# Patient Record
Sex: Male | Born: 1975
Health system: Southern US, Community
[De-identification: ages and names within clinical notes are randomized; demographics above are authoritative.]

## PROBLEM LIST (undated history)

## (undated) DIAGNOSIS — M1990 Unspecified osteoarthritis, unspecified site: Secondary | ICD-10-CM

## (undated) DIAGNOSIS — F419 Anxiety disorder, unspecified: Secondary | ICD-10-CM

## (undated) DIAGNOSIS — K219 Gastro-esophageal reflux disease without esophagitis: Secondary | ICD-10-CM

## (undated) DIAGNOSIS — M5126 Other intervertebral disc displacement, lumbar region: Secondary | ICD-10-CM

## (undated) DIAGNOSIS — R51 Headache: Secondary | ICD-10-CM

## (undated) HISTORY — PX: HERNIA REPAIR: SHX51

## (undated) HISTORY — PX: TONSILLECTOMY: SUR1361

## (undated) HISTORY — DX: Gastro-esophageal reflux disease without esophagitis: K21.9

## (undated) HISTORY — PX: LUMBAR EPIDURAL INJECTION: SHX1980

---

## 1999-03-20 ENCOUNTER — Emergency Department (HOSPITAL_COMMUNITY): Admission: EM | Admit: 1999-03-20 | Discharge: 1999-03-20 | Payer: Self-pay

## 1999-03-20 ENCOUNTER — Encounter: Payer: Self-pay | Admitting: Emergency Medicine

## 2006-04-02 ENCOUNTER — Emergency Department (HOSPITAL_COMMUNITY): Admission: EM | Admit: 2006-04-02 | Discharge: 2006-04-02 | Payer: Self-pay | Admitting: Emergency Medicine

## 2007-09-24 ENCOUNTER — Emergency Department (HOSPITAL_COMMUNITY): Admission: EM | Admit: 2007-09-24 | Discharge: 2007-09-24 | Payer: Self-pay | Admitting: Family Medicine

## 2010-08-16 ENCOUNTER — Other Ambulatory Visit: Payer: Self-pay | Admitting: Family Medicine

## 2010-08-16 DIAGNOSIS — M79606 Pain in leg, unspecified: Secondary | ICD-10-CM

## 2010-08-16 DIAGNOSIS — M545 Low back pain: Secondary | ICD-10-CM

## 2010-08-16 DIAGNOSIS — Z77018 Contact with and (suspected) exposure to other hazardous metals: Secondary | ICD-10-CM

## 2010-08-26 ENCOUNTER — Ambulatory Visit
Admission: RE | Admit: 2010-08-26 | Discharge: 2010-08-26 | Disposition: A | Discharge status: Home / Self Care | Payer: 59 | Source: Ambulatory Visit | Attending: Family Medicine | Admitting: Family Medicine

## 2010-08-26 DIAGNOSIS — M545 Low back pain: Secondary | ICD-10-CM

## 2010-08-26 DIAGNOSIS — Z77018 Contact with and (suspected) exposure to other hazardous metals: Secondary | ICD-10-CM

## 2010-08-26 DIAGNOSIS — M79606 Pain in leg, unspecified: Secondary | ICD-10-CM

## 2011-08-06 ENCOUNTER — Ambulatory Visit: Payer: 59

## 2011-09-12 ENCOUNTER — Encounter (HOSPITAL_COMMUNITY): Payer: Self-pay | Admitting: Emergency Medicine

## 2011-09-12 ENCOUNTER — Emergency Department (HOSPITAL_COMMUNITY)
Admission: EM | Admit: 2011-09-12 | Discharge: 2011-09-12 | Disposition: A | Discharge status: Home / Self Care | Payer: 59 | Attending: Emergency Medicine | Admitting: Emergency Medicine

## 2011-09-12 DIAGNOSIS — D72829 Elevated white blood cell count, unspecified: Secondary | ICD-10-CM | POA: Insufficient documentation

## 2011-09-12 DIAGNOSIS — K5289 Other specified noninfective gastroenteritis and colitis: Secondary | ICD-10-CM | POA: Insufficient documentation

## 2011-09-12 DIAGNOSIS — M549 Dorsalgia, unspecified: Secondary | ICD-10-CM | POA: Insufficient documentation

## 2011-09-12 DIAGNOSIS — K529 Noninfective gastroenteritis and colitis, unspecified: Secondary | ICD-10-CM

## 2011-09-12 HISTORY — DX: Anxiety disorder, unspecified: F41.9

## 2011-09-12 LAB — COMPREHENSIVE METABOLIC PANEL
AST: 22 U/L (ref 0–37)
Albumin: 4.1 g/dL (ref 3.5–5.2)
Alkaline Phosphatase: 58 U/L (ref 39–117)
Chloride: 104 mEq/L (ref 96–112)
Potassium: 3.8 mEq/L (ref 3.5–5.1)
Total Bilirubin: 0.6 mg/dL (ref 0.3–1.2)
Total Protein: 7.5 g/dL (ref 6.0–8.3)

## 2011-09-12 LAB — URINALYSIS, ROUTINE W REFLEX MICROSCOPIC
Bilirubin Urine: NEGATIVE
Glucose, UA: NEGATIVE mg/dL
Ketones, ur: NEGATIVE mg/dL
Leukocytes, UA: NEGATIVE
Specific Gravity, Urine: 1.017 (ref 1.005–1.030)
pH: 8.5 — ABNORMAL HIGH (ref 5.0–8.0)

## 2011-09-12 LAB — DIFFERENTIAL
Basophils Absolute: 0 10*3/uL (ref 0.0–0.1)
Basophils Relative: 0 % (ref 0–1)
Eosinophils Absolute: 0.2 10*3/uL (ref 0.0–0.7)
Neutro Abs: 19.3 10*3/uL — ABNORMAL HIGH (ref 1.7–7.7)
Neutrophils Relative %: 88 % — ABNORMAL HIGH (ref 43–77)

## 2011-09-12 LAB — URINE MICROSCOPIC-ADD ON

## 2011-09-12 LAB — CBC
MCHC: 35.5 g/dL (ref 30.0–36.0)
Platelets: 276 10*3/uL (ref 150–400)
RDW: 12.8 % (ref 11.5–15.5)

## 2011-09-12 MED ORDER — ONDANSETRON HCL 4 MG/2ML IJ SOLN
4.0000 mg | Freq: Once | INTRAMUSCULAR | Status: AC
  Administered 2011-09-12: 4 mg via INTRAVENOUS
  Filled 2011-09-12: qty 2

## 2011-09-12 MED ORDER — DICYCLOMINE HCL 20 MG PO TABS: 20.0000 mg | ORAL_TABLET | Freq: Four times a day (QID) | ORAL | Status: DC | PRN

## 2011-09-12 MED ORDER — ONDANSETRON HCL 4 MG PO TABS: 4.0000 mg | ORAL_TABLET | Freq: Four times a day (QID) | ORAL | Status: AC | PRN

## 2011-09-12 MED ORDER — FENTANYL CITRATE 0.05 MG/ML IJ SOLN
50.0000 ug | Freq: Once | INTRAMUSCULAR | Status: DC
  Filled 2011-09-12: qty 2

## 2011-09-12 MED ORDER — FENTANYL CITRATE 0.05 MG/ML IJ SOLN
50.0000 ug | Freq: Once | INTRAMUSCULAR | Status: AC
  Administered 2011-09-12: 50 ug via INTRAVENOUS
  Filled 2011-09-12: qty 2

## 2011-09-12 MED ORDER — DICYCLOMINE HCL 10 MG PO CAPS
20.0000 mg | ORAL_CAPSULE | Freq: Once | ORAL | Status: AC
  Administered 2011-09-12: 20 mg via ORAL
  Filled 2011-09-12 (×2): qty 2

## 2011-09-12 NOTE — ED Provider Notes (Signed)
Medical screening examination/treatment/procedure(s) were conducted as a shared visit with non-physician practitioner(s) and myself.  I personally evaluated the patient during the encounter   Carolee Channell, MD 09/12/11 1636 

## 2011-09-12 NOTE — ED Notes (Signed)
Pt states that for the past couple of days he has been having nausea with abdominal pain. He is alert and oriented. Breath sounds are clear and bowel sounds are present. Iv started and protocols initiated.

## 2011-09-12 NOTE — Discharge Instructions (Signed)
Viral Gastroenteritis Viral gastroenteritis is also known as stomach flu. This condition affects the stomach and intestinal tract. It can cause sudden diarrhea and vomiting. The illness typically lasts 3 to 8 days. Most people develop an immune response that eventually gets rid of the virus. While this natural response develops, the virus can make you quite ill. CAUSES  Many different viruses can cause gastroenteritis, such as rotavirus or noroviruses. You can catch one of these viruses by consuming contaminated food or water. You may also catch a virus by sharing utensils or other personal items with an infected person or by touching a contaminated surface. SYMPTOMS  The most common symptoms are diarrhea and vomiting. These problems can cause a severe loss of body fluids (dehydration) and a body salt (electrolyte) imbalance. Other symptoms may include:  Fever.   Headache.   Fatigue.   Abdominal pain.  DIAGNOSIS  Your caregiver can usually diagnose viral gastroenteritis based on your symptoms and a physical exam. A stool sample may also be taken to test for the presence of viruses or other infections. TREATMENT  This illness typically goes away on its own. Treatments are aimed at rehydration. The most serious cases of viral gastroenteritis involve vomiting so severely that you are not able to keep fluids down. In these cases, fluids must be given through an intravenous line (IV). HOME CARE INSTRUCTIONS   Drink enough fluids to keep your urine clear or pale yellow. Drink small amounts of fluids frequently and increase the amounts as tolerated.   Ask your caregiver for specific rehydration instructions.   Avoid:   Foods high in sugar.   Alcohol.   Carbonated drinks.   Tobacco.   Juice.   Caffeine drinks.   Extremely hot or cold fluids.   Fatty, greasy foods.   Too much intake of anything at one time.   Dairy products until 24 to 48 hours after diarrhea stops.   You may  consume probiotics. Probiotics are active cultures of beneficial bacteria. They may lessen the amount and number of diarrheal stools in adults. Probiotics can be found in yogurt with active cultures and in supplements.   Wash your hands well to avoid spreading the virus.   Only take over-the-counter or prescription medicines for pain, discomfort, or fever as directed by your caregiver. Do not give aspirin to children. Antidiarrheal medicines are not recommended.   Ask your caregiver if you should continue to take your regular prescribed and over-the-counter medicines.   Keep all follow-up appointments as directed by your caregiver.  SEEK IMMEDIATE MEDICAL CARE IF:   You are unable to keep fluids down.   You do not urinate at least once every 6 to 8 hours.   You develop shortness of breath.   You notice blood in your stool or vomit. This may look like coffee grounds.   You have abdominal pain that increases or is concentrated in one small area (localized).   You have persistent vomiting or diarrhea.   You have a fever.   The patient is a child younger than 3 months, and he or she has a fever.   The patient is a child older than 3 months, and he or she has a fever and persistent symptoms.   The patient is a child older than 3 months, and he or she has a fever and symptoms suddenly get worse.   The patient is a baby, and he or she has no tears when crying.  MAKE SURE YOU:     Understand these instructions.   Will watch your condition.   Will get help right away if you are not doing well or get worse.  Document Released: 06/26/2005 Document Revised: 06/15/2011 Document Reviewed: 04/12/2011 [employer] Patient Information 2012 [employer].  Clear Liquid Diet The clear liquid dietconsists of foods that are liquid or will become liquid at room temperature.You should be able to see through the liquid and beverages. Examples of foods allowed on a clear liquid diet include fruit  juice, broth or bouillon, gelatin, or frozen ice pops. The purpose of this diet is to provide necessary fluid, electrolytes such as sodium and potassium, and energy to keep the body functioning during times when you are not able to consume a regular diet.A clear liquid diet should not be continued for long periods of time as it is not nutritionally adequate.  REASONS FOR USING A CLEAR LIQUID DIET  In sudden onset (acute) conditions for a patient before or after surgery.   As the first step in oral feeding.   For fluid and electrolyte replacement in diarrheal diseases.   As a diet before certain medical tests are performed.  ADEQUACY The clear liquid diet is adequate only in ascorbic acid, according to the Recommended Dietary Allowances of the National Research Council. CHOOSING FOODS Breads and Starches  Allowed:  None are allowed.   Avoid: All are avoided.  Vegetables  Allowed:  Strained tomato or vegetable juice.   Avoid: Any others.  Fruit  Allowed:  Strained fruit juices and fruit drinks. Include 1 serving of citrus or vitamin C-enriched fruit juice daily.   Avoid: Any others.  Meat and Meat Substitutes  Allowed:  None are allowed.   Avoid: All are avoided.  Milk  Allowed:  None are allowed.   Avoid: All are avoided.  Soups and Combination Foods  Allowed:  Clear bouillon, broth, or strained broth-based soups.   Avoid: Any others.  Desserts and Sweets  Allowed:  Sugar, honey. High protein gelatin. Flavored gelatin, ices, or frozen ice pops that do not contain milk.   Avoid: Any others.  Fats and Oils  Allowed:  None are allowed.   Avoid: All are avoided.  Beverages  Allowed: Cereal beverages, coffee (regular or decaffeinated), tea, or soda at the discretion of your caregiver.   Avoid: Any others.  Condiments  Allowed:  Iodized salt.   Avoid: Any others, including pepper.  Supplements  Allowed:  Liquid nutrition beverages.   Avoid: Any others  that contain lactose or fiber.  SAMPLE MEAL PLAN Breakfast  4 oz (120 mL) strained orange juice.    to 1 cup (125 to 250 mL) gelatin (plain or fortified).   1 cup (250 mL) beverage (coffee or tea).   Sugar, if desired.  Midmorning Snack   cup (125 mL) gelatin (plain or fortified).  Lunch  1 cup (250 mL) broth or consomm.   4 oz (120 mL) strained grapefruit juice.    cup (125 mL) gelatin (plain or fortified).   1 cup (250 mL) beverage (coffee or tea).   Sugar, if desired.  Midafternoon Snack   cup (125 mL) fruit ice.    cup (125 mL) strained fruit juice.  Dinner  1 cup (250 mL) broth or consomm.    cup (125 mL) cranberry juice.    cup (125 mL) flavored gelatin (plain or fortified).   1 cup (250 mL) beverage (coffee or tea).   Sugar, if desired.  Evening Snack  4 oz (120 mL)   strained apple juice (vitamin C-fortified).    cup (125 mL) flavored gelatin (plain or fortified).  Document Released: 06/26/2005 Document Revised: 06/15/2011 Document Reviewed: 09/23/2010 [employer] Patient Information 2012 [employer]. 

## 2011-09-12 NOTE — ED Provider Notes (Addendum)
36 year old male comes in with vomiting and diarrhea. He denies abdominal pain. He several family members also have had vomiting and diarrhea and ALL within a few hours of each other. On exam, his abdomen is soft and nontender. He'll be treated with IV fluids and IV antiemetics.  Dione Booze, MD 09/12/11 1307  Dione Booze, MD 09/12/11 1308

## 2011-09-12 NOTE — ED Provider Notes (Signed)
History     CSN: [id number]  Arrival date & time 09/12/11  2956   First MD Initiated Contact with Patient 09/12/11 [id number]      No chief complaint on file.   (Consider location/radiation/quality/duration/timing/severity/associated sxs/prior treatment) The history is provided by the patient.   patient is a relatively healthy 36 year old male who presents with a chief complaint of nausea, vomiting, and diarrhea since yesterday. There are multiple sick contacts in the home. The vomiting is nonbloody and nonbilious. The diarrhea is nonbloody and without mucus. There's been associated abdominal cramping. There's been no fever, chills, lightheadedness. There are no aggravating or alleviating factors. There's been no prior treatment.  Past Medical History  Diagnosis Date  . Anxiety   . Attention deficit disorder     Past Surgical History  Procedure Date  . Hernia repair     No family history on file.  History  Substance Use Topics  . Smoking status: Never Smoker   . Smokeless tobacco: Not on file  . Alcohol Use: Yes      Review of Systems 10 systems reviewed and are negative for acute change except as noted in the HPI.  Allergies  Review of patient's allergies indicates no known allergies.  Home Medications  No current outpatient prescriptions on file.  BP 107/80  Pulse 90  Temp 97.8 F (36.6 C)  Resp 16  SpO2 99%  Physical Exam  Nursing note reviewed. Constitutional: He is oriented to person, place, and time. He appears well-developed and well-nourished. Distressed: mildly uncomfortable appearing.       Vital signs are reviewed and are normal  HENT:  Head: Normocephalic and atraumatic.  Right Ear: External ear normal.  Left Ear: External ear normal.       Mucous membranes are moist  Eyes: Conjunctivae are normal. Pupils are equal, round, and reactive to light.  Neck: Normal range of motion. Neck supple.  Cardiovascular: Normal rate, regular rhythm and normal  heart sounds.   Pulmonary/Chest: Effort normal and breath sounds normal. No respiratory distress. He has no wheezes. He exhibits no tenderness.  Abdominal: Soft. Bowel sounds are normal. He exhibits no distension. There is no rebound and no guarding.       Mild generalized abdominal tenderness without rigidity  Musculoskeletal: He exhibits no edema and no tenderness.  Neurological: He is alert and oriented to person, place, and time. No cranial nerve deficit.  Skin: Skin is warm and dry. No rash noted.  Psychiatric: He has a normal mood and affect.    ED Course  Procedures (including critical care time)  Labs Reviewed  CBC - Abnormal; Notable for the following:    WBC 22.0 (*)    All other components within normal limits  DIFFERENTIAL - Abnormal; Notable for the following:    Neutrophils Relative 88 (*)    Neutro Abs 19.3 (*)    Lymphocytes Relative 5 (*)    Monocytes Absolute 1.5 (*)    All other components within normal limits  COMPREHENSIVE METABOLIC PANEL - Abnormal; Notable for the following:    Glucose, Bld 136 (*)    GFR calc non Af Amer 83 (*)    All other components within normal limits  URINALYSIS, ROUTINE W REFLEX MICROSCOPIC - Abnormal; Notable for the following:    pH 8.5 (*)    Protein, ur 30 (*)    All other components within normal limits  LIPASE, BLOOD  URINE MICROSCOPIC-ADD ON   No results found.   1.  Gastroenteritis       MDM  9:20 AM Symptoms and history consistent with a viral gastroenteritis. Will provide IV fluid hydration and antibiotics. Labs have been ordered by nursing prior to my assessment. These we reviewed. We'll continue to monitor.     10:30AM Worsening abdominal cramping with controlled vomiting. Patient will be given Bentyl and allowed a PO trial.     11:15 AM Patient reports back pain that he feels is secondary to his abdominal discomfort. He does have a history of chronic back pain as is not reported. Fentanyl has been  ordered. Abdominal exam is unchanged.    12:54 PM Pt with significant symptom relief. Labs are reviewed and are significant for leukocytosis. Has tolerated PO in department. Will be d/c home with rx for bentyl and zofran.  Shaaron Adler, PA-C 09/12/11 1330

## 2011-09-12 NOTE — ED Notes (Signed)
Pt states whole family has had n/v wife  Was here yesterday w/ same and son has it today and is in peds

## 2012-06-20 ENCOUNTER — Encounter (HOSPITAL_COMMUNITY): Payer: Self-pay | Admitting: Emergency Medicine

## 2012-06-20 ENCOUNTER — Encounter (HOSPITAL_COMMUNITY): Payer: Self-pay | Admitting: *Deleted

## 2012-06-20 ENCOUNTER — Observation Stay (HOSPITAL_COMMUNITY)
Admission: EM | Admit: 2012-06-20 | Discharge: 2012-06-23 | Disposition: A | Discharge status: Home / Self Care | Payer: 59 | Attending: Internal Medicine | Admitting: Internal Medicine

## 2012-06-20 ENCOUNTER — Observation Stay (HOSPITAL_COMMUNITY)
Admission: EM | Admit: 2012-06-20 | Discharge: 2012-06-20 | Disposition: A | Discharge status: Home / Self Care | Payer: 59 | Attending: Emergency Medicine | Admitting: Emergency Medicine

## 2012-06-20 DIAGNOSIS — M5126 Other intervertebral disc displacement, lumbar region: Secondary | ICD-10-CM | POA: Insufficient documentation

## 2012-06-20 DIAGNOSIS — M549 Dorsalgia, unspecified: Secondary | ICD-10-CM

## 2012-06-20 DIAGNOSIS — Z79899 Other long term (current) drug therapy: Secondary | ICD-10-CM | POA: Insufficient documentation

## 2012-06-20 DIAGNOSIS — M62830 Muscle spasm of back: Secondary | ICD-10-CM | POA: Diagnosis present

## 2012-06-20 DIAGNOSIS — M5380 Other specified dorsopathies, site unspecified: Principal | ICD-10-CM | POA: Insufficient documentation

## 2012-06-20 DIAGNOSIS — R262 Difficulty in walking, not elsewhere classified: Secondary | ICD-10-CM | POA: Insufficient documentation

## 2012-06-20 DIAGNOSIS — M62838 Other muscle spasm: Secondary | ICD-10-CM

## 2012-06-20 HISTORY — DX: Other intervertebral disc displacement, lumbar region: M51.26

## 2012-06-20 MED ORDER — ONDANSETRON HCL 4 MG/2ML IJ SOLN: 4.0000 mg | Freq: Four times a day (QID) | INTRAMUSCULAR | Status: DC | PRN

## 2012-06-20 MED ORDER — IBUPROFEN 800 MG PO TABS
800.0000 mg | ORAL_TABLET | Freq: Once | ORAL | Status: AC
  Administered 2012-06-20: 800 mg via ORAL
  Filled 2012-06-20: qty 1

## 2012-06-20 MED ORDER — DIAZEPAM 5 MG PO TABS: 5.0000 mg | ORAL_TABLET | Freq: Three times a day (TID) | ORAL | Status: DC | PRN

## 2012-06-20 MED ORDER — HYDROMORPHONE HCL PF 1 MG/ML IJ SOLN
1.0000 mg | Freq: Once | INTRAMUSCULAR | Status: AC
  Administered 2012-06-20: 1 mg via INTRAVENOUS

## 2012-06-20 MED ORDER — OXYCODONE-ACETAMINOPHEN 10-325 MG PO TABS: 1.0000 | ORAL_TABLET | ORAL | Status: DC | PRN

## 2012-06-20 MED ORDER — SODIUM CHLORIDE 0.9 % IV SOLN
1000.0000 mL | INTRAVENOUS | Status: DC
  Administered 2012-06-20: 1000 mL via INTRAVENOUS

## 2012-06-20 MED ORDER — DIAZEPAM 5 MG/ML IJ SOLN: 5.0000 mg | Freq: Once | INTRAMUSCULAR | Status: DC

## 2012-06-20 MED ORDER — DIAZEPAM 5 MG PO TABS
5.0000 mg | ORAL_TABLET | Freq: Once | ORAL | Status: AC
  Administered 2012-06-20: 5 mg via ORAL

## 2012-06-20 MED ORDER — OXYCODONE-ACETAMINOPHEN 5-325 MG PO TABS
2.0000 | ORAL_TABLET | Freq: Once | ORAL | Status: AC
  Administered 2012-06-20: 2 via ORAL
  Filled 2012-06-20: qty 2

## 2012-06-20 MED ORDER — DIAZEPAM 5 MG PO TABS
5.0000 mg | ORAL_TABLET | Freq: Four times a day (QID) | ORAL | Status: DC | PRN
  Administered 2012-06-20: 5 mg via ORAL
  Filled 2012-06-20: qty 1

## 2012-06-20 MED ORDER — KETOROLAC TROMETHAMINE 30 MG/ML IJ SOLN
30.0000 mg | Freq: Four times a day (QID) | INTRAMUSCULAR | Status: DC | PRN
  Administered 2012-06-21 – 2012-06-22 (×5): 30 mg via INTRAVENOUS
  Filled 2012-06-20 (×5): qty 1

## 2012-06-20 MED ORDER — HYDROMORPHONE HCL PF 1 MG/ML IJ SOLN: 1.0000 mg | Freq: Once | INTRAMUSCULAR | Status: AC

## 2012-06-20 MED ORDER — HYDROMORPHONE HCL PF 1 MG/ML IJ SOLN
1.0000 mg | Freq: Four times a day (QID) | INTRAMUSCULAR | Status: AC | PRN
  Administered 2012-06-20 – 2012-06-22 (×5): 1 mg via INTRAVENOUS
  Filled 2012-06-20 (×5): qty 1

## 2012-06-20 MED ORDER — ONDANSETRON HCL 4 MG/2ML IJ SOLN
4.0000 mg | Freq: Four times a day (QID) | INTRAMUSCULAR | Status: DC | PRN
  Administered 2012-06-20: 4 mg via INTRAVENOUS
  Filled 2012-06-20: qty 2

## 2012-06-20 MED ORDER — DIAZEPAM 5 MG/ML IJ SOLN
5.0000 mg | Freq: Four times a day (QID) | INTRAMUSCULAR | Status: DC | PRN
  Administered 2012-06-21: 5 mg via INTRAVENOUS
  Filled 2012-06-20: qty 2

## 2012-06-20 MED ORDER — HYDROMORPHONE HCL PF 1 MG/ML IJ SOLN
1.0000 mg | INTRAMUSCULAR | Status: DC | PRN
  Administered 2012-06-20 (×2): 1 mg via INTRAVENOUS
  Filled 2012-06-20 (×4): qty 1

## 2012-06-20 MED ORDER — DIAZEPAM 5 MG/ML IJ SOLN
5.0000 mg | Freq: Once | INTRAMUSCULAR | Status: AC
  Administered 2012-06-20: 5 mg via INTRAVENOUS
  Filled 2012-06-20: qty 2

## 2012-06-20 MED ORDER — DIAZEPAM 5 MG PO TABS
5.0000 mg | ORAL_TABLET | Freq: Once | ORAL | Status: AC
  Administered 2012-06-20: 5 mg via ORAL
  Filled 2012-06-20: qty 1

## 2012-06-20 NOTE — Discharge Instructions (Signed)
Followup with orthopedics if symptoms continue. Use conservative methods at home including heat therapy and cold therapy as we discussed. More information on cold therapy is listed below.  It is not reccommended to use heat treatment directly after an acute injury. ° °SEEK IMMEDIATE MEDICAL ATTENTION IF: °New numbness, tingling, weakness, or problem with the use of your arms or legs.  °Severe back pain not relieved with medications.  °Change in bowel or bladder control.  °Increasing pain in any areas of the body (such as chest or abdominal pain).  °Shortness of breath, dizziness or fainting.  °Nausea (feeling sick to your stomach), vomiting, fever, or sweats. ° °COLD THERAPY DIRECTIONS:  °Ice or gel packs can be used to reduce both pain and swelling. Ice is the most helpful within the first 24 to 48 hours after an injury or flareup from overusing a muscle or joint.  Ice is effective, has very few side effects, and is safe for most people to use.  ° °If you expose your skin to cold temperatures for too long or without the proper protection, you can damage your skin or nerves. Watch for signs of skin damage due to cold.  ° °HOME CARE INSTRUCTIONS  °Follow these tips to use ice and cold packs safely.  °Place a dry or damp towel between the ice and skin. A damp towel will cool the skin more quickly, so you may need to shorten the time that the ice is used.  °For a more rapid response, add gentle compression to the ice.  °Ice for no more than 10 to 20 minutes at a time. The bonier the area you are icing, the less time it will take to get the benefits of ice.  °Check your skin after 5 minutes to make sure there are no signs of a poor response to cold or skin damage.  °Rest 20 minutes or more in between uses.  °Once your skin is numb, you can end your treatment. You can test numbness by very lightly touching your skin. The touch should be so light that you do not see the skin dimple from the pressure of your fingertip.  When using ice, most people will feel these normal sensations in this order: cold, burning, aching, and numbness.  °Do not use ice on someone who cannot communicate their responses to pain, such as small children or people with dementia.  ° °HOW TO MAKE AN ICE PACK  °To make an ice pack, do one of the following:  °Place crushed ice or a bag of frozen vegetables in a sealable plastic bag. Squeeze out the excess air. Place this bag inside another plastic bag. Slide the bag into a pillowcase or place a damp towel between your skin and the bag.  °Mix 3 parts water with 1 part rubbing alcohol. Freeze the mixture in a sealable plastic bag. When you remove the mixture from the freezer, it will be slushy. Squeeze out the excess air. Place this bag inside another plastic bag. Slide the bag into a pillowcase or place a damp towel between your skin and the bag.  ° °SEEK MEDICAL CARE IF:  °You develop white spots on your skin. This may give the skin a blotchy (mottled) appearance.  °Your skin turns blue or pale.  °Your skin becomes waxy or hard.  °Your swelling gets worse.  °MAKE SURE YOU:  °Understand these instructions.  °Will watch your condition.  °Will get help right away if you are not doing well or   get worse.    Chronic Pain Discharge Instructions  Emergency care providers appreciate that many patients coming to [place] are in severe pain and we wish to address their pain in the safest, most responsible manner.  It is important to recognize however, that the proper treatment of chronic pain differs from that of the pain of injuries and acute illnesses.  Our goal is to provide quality, safe, personalized care and we thank you for giving [place] the opportunity to serve you. The use of narcotics and related agents for chronic pain syndromes may lead to additional physical and psychological problems.  Nearly as many people die from prescription narcotics each year as die from car crashes.  Additionally, this risk is increased if  such prescriptions are obtained from a variety of sources.  Therefore, only your primary care physician or a pain management specialist is able to safely treat such syndromes with narcotic medications long-term.    Documentation revealing such prescriptions have been sought from multiple sources may prohibit [place] from providing a refill or different narcotic medication.  Your name may be checked first through the [facility] Controlled Substances Reporting System.  This database is a record of controlled substance medication prescriptions that the patient has received.  This has been established by [facility] in an effort to eliminate the dangerous, and often life threatening, practice of obtaining multiple prescriptions from different medical providers.   If you have a chronic pain syndrome (i.e. chronic headaches, recurrent back or neck pain, dental pain, abdominal or pelvis pain without a specific diagnosis, or neuropathic pain such as fibromyalgia) or recurrent visits for the same condition without an acute diagnosis, you may be treated with non-narcotics and other non-addictive medicines.  Allergic reactions or negative side effects that may be reported by a patient to such medications will not typically lead to the use of a narcotic analgesic or other controlled substance as an alternative.   Patients managing chronic pain with a personal physician should have provisions in place for breakthrough pain.  If you are in crisis, you should call your physician.  If your physician directs you to the emergency department, please have the doctor call and speak to our attending physician concerning your care.   When patients come to the Emergency Department (ED) with acute medical conditions in which the Emergency Department physician feels appropriate to prescribe narcotic or sedating pain medication, the physician will prescribe these in very limited quantities.  The amount of these medications will last  only until you can see your primary care physician in his/her office.  Any patient who returns to the ED seeking refills should expect only non-narcotic pain medications.   In the event of an acute medical condition exists and the emergency physician feels it is necessary that the patient be given a narcotic or sedating medication -  a responsible adult driver should be present in the room prior to the medication being given by the nurse.   Prescriptions for narcotic or sedating medications that have been lost, stolen or expired will not be refilled in the Emergency Department.    Patients who have chronic pain may receive non-narcotic prescriptions until seen by their primary care physician.  It is every patients personal responsibility to maintain active prescriptions with his or her primary care physician or specialist.    Back Exercises Back exercises help treat and prevent back injuries. The goal of back exercises is to increase the strength of your abdominal and back muscles  and the flexibility of your back. These exercises should be started when you no longer have back pain. Back exercises include:  Pelvic Tilt. Lie on your back with your knees bent. Tilt your pelvis until the lower part of your back is against the floor. Hold this position 5 to 10 sec and repeat 5 to 10 times.  Knee to Chest. Pull first 1 knee up against your chest and hold for 20 to 30 seconds, repeat this with the other knee, and then both knees. This may be done with the other leg straight or bent, whichever feels better.  Sit-Ups or Curl-Ups. Bend your knees 90 degrees. Start with tilting your pelvis, and do a partial, slow sit-up, lifting your trunk only 30 to 45 degrees off the floor. Take at least 2 to 3 seconds for each sit-up. Do not do sit-ups with your knees out straight. If partial sit-ups are difficult, simply do the above but with only tightening your abdominal muscles and holding it as directed.  Hip-Lift.  Lie on your back with your knees flexed 90 degrees. Push down with your feet and shoulders as you raise your hips a couple inches off the floor; hold for 10 seconds, repeat 5 to 10 times.  Back arches. Lie on your stomach, propping yourself up on bent elbows. Slowly press on your hands, causing an arch in your low back. Repeat 3 to 5 times. Any initial stiffness and discomfort should lessen with repetition over time.  Shoulder-Lifts. Lie face down with arms beside your body. Keep hips and torso pressed to floor as you slowly lift your head and shoulders off the floor. Do not overdo your exercises, especially in the beginning. Exercises may cause you some mild back discomfort which lasts for a few minutes; however, if the pain is more severe, or lasts for more than 15 minutes, do not continue exercises until you see your caregiver. Improvement with exercise therapy for back problems is slow.  See your caregivers for assistance with developing a proper back exercise program. Document Released: 08/03/2004 Document Revised: 09/18/2011 Document Reviewed: 04/27/2011 [facility] Patient Information 2013 [place], [place].

## 2012-06-20 NOTE — ED Provider Notes (Signed)
History     CSN: [id number]  Arrival date & time 06/20/12  4098   First MD Initiated Contact with Patient 06/20/12 0818      Chief Complaint  Patient presents with  . Back Pain    HPI The patient presents with back pain.  He has a long history of back pain, episodic.  He has a history of herniated discs for which he is previously received epidural injections.  He states until yesterday he was in his usual state of health.  Since yesterday he has had pain focally in the low back.  The pain is sharp, worse with motion, worse with weightbearing.  The pain was initially tolerable, but today, just prior to arrival, the patient twisted, felt a severe exacerbation.  Minimal improvement with ibuprofen, cyclobenzaprine.  No f/c, no incontinence, no weakness, emeses / dysuria / hematuria.   Past Medical History  Diagnosis Date  . Anxiety   . Attention deficit disorder     Past Surgical History  Procedure Date  . Hernia repair     No family history on file.  History  Substance Use Topics  . Smoking status: Never Smoker   . Smokeless tobacco: Not on file  . Alcohol Use: Yes      Review of Systems  Constitutional:       Per HPI, otherwise negative  HENT:       Per HPI, otherwise negative  Eyes: Negative.   Respiratory:       Per HPI, otherwise negative  Cardiovascular:       Per HPI, otherwise negative  Gastrointestinal: Negative for vomiting.  Genitourinary: Negative.   Musculoskeletal:       Per HPI, otherwise negative  Skin: Negative.   Neurological: Negative for syncope.    Allergies  Review of patient's allergies indicates no known allergies.  Home Medications   Current Outpatient Rx  Name  Route  Sig  Dispense  Refill  . DICYCLOMINE HCL 20 MG PO TABS   Oral   Take 1 tablet (20 mg total) by mouth every 6 (six) hours as needed (for abdominal cramping).   20 tablet   0     BP 123/81  Pulse 82  Temp 98 F (36.7 C) (Oral)  Resp 20  SpO2  98%  Physical Exam  Nursing note and vitals reviewed. Constitutional: He is oriented to person, place, and time. He appears well-developed. No distress.  HENT:  Head: Normocephalic and atraumatic.  Eyes: Conjunctivae normal and EOM are normal.  Cardiovascular: Normal rate and regular rhythm.   Pulmonary/Chest: Effort normal. No stridor. No respiratory distress.  Abdominal: He exhibits no distension.  Musculoskeletal: He exhibits no edema.       Arms:      The patient flexes and extends both hips, though with pain in the low back with all motion.  He also flexes and extends both knees and ankles appropriately.  Neurological: He is alert and oriented to person, place, and time.  Skin: Skin is warm and dry.  Psychiatric: He has a normal mood and affect.    ED Course  Procedures (including critical care time)  Labs Reviewed - No data to display No results found.   No diagnosis found.  9:50 AM The patient's back pain is poorly controlled with oral medication.  There was some improvement, but the patient cannot ambulate.  He'll be transferred to the CDU.  MDM  This patient with a history of back pain now presents  after an acute exacerbation of his pain.  On exam he is neurologically intact, in no distress when not moving.  However, the patient is in pain, and there was little improvement with oral medication.  The patient was transferred to the CDU under the back pain protocol.   Gerhard Munch, MD 06/20/12 [phone]

## 2012-06-20 NOTE — ED Notes (Signed)
Pt c/o lower back pain from bulging disc in L3; pt seen here today for same and discharged home; pt sts increased pain while riding in car and came back

## 2012-06-20 NOTE — ED Notes (Signed)
Pt with intermittent back spasms. Pt states pain in bearable when not in spasm. Family at bedside.Pt states he did not make it all the way home from ER before spasms increase and became unbearable.

## 2012-06-20 NOTE — ED Notes (Signed)
Pt woke c/o back spasm. Medication given. Wife at bedside

## 2012-06-20 NOTE — ED Notes (Signed)
Patient states ongoing back pain, patient states herniated disc with previous steroid injections

## 2012-06-20 NOTE — ED Notes (Signed)
Family at bedside. 

## 2012-06-20 NOTE — ED Provider Notes (Signed)
I was available for consultation or any relevant portion of the CDU care.  I placed the patient are on the back pain protocol.  Gerhard Munch, MD 06/20/12 [phone]

## 2012-06-20 NOTE — ED Notes (Signed)
Pt. Is resting comfortablly 

## 2012-06-20 NOTE — ED Provider Notes (Signed)
History     CSN: [id number]  Arrival date & time 06/20/12  1728   First MD Initiated Contact with Patient 06/20/12 1950      Chief Complaint  Patient presents with  . Back Pain    (Consider location/radiation/quality/duration/timing/severity/associated sxs/prior treatment) Patient is a 36 y.o. male presenting with back pain. The history is provided by the patient and medical records. No language interpreter was used.  Back Pain  This is a recurrent problem. The current episode started 1 to 2 hours ago. The problem occurs constantly. The problem has been gradually worsening. The pain is associated with no known injury. The pain is present in the lumbar spine. The quality of the pain is described as cramping. The pain does not radiate. The pain is severe. The symptoms are aggravated by certain positions. The pain is the same all the time. Associated symptoms include pelvic pain. Pertinent negatives include no fever, no numbness, no bowel incontinence, no perianal numbness, no bladder incontinence, no dysuria, no paresthesias, no paresis, no tingling and no weakness.  Patient recently discharged home from short stay in CDU on back pain protocol.  At time of initial discharge, symptoms had improved enough that patient wanted to try to return home.  About 20 minutes into trip home in the car, severe back spasms returned, precipitating the patient to return to the ED.  Past Medical History  Diagnosis Date  . Anxiety   . Attention deficit disorder     Past Surgical History  Procedure Date  . Hernia repair     History reviewed. No pertinent family history.  History  Substance Use Topics  . Smoking status: Never Smoker   . Smokeless tobacco: Not on file  . Alcohol Use: Yes      Review of Systems  Constitutional: Negative for fever.  Gastrointestinal: Negative for bowel incontinence.  Genitourinary: Positive for pelvic pain. Negative for bladder incontinence and dysuria.   Musculoskeletal: Positive for back pain.  Neurological: Negative for tingling, weakness, numbness and paresthesias.  All other systems reviewed and are negative.    Allergies  Review of patient's allergies indicates no known allergies.  Home Medications   Current Outpatient Rx  Name  Route  Sig  Dispense  Refill  . DIAZEPAM 5 MG PO TABS   Oral   Take 1 tablet (5 mg total) by mouth every 8 (eight) hours as needed for anxiety.   12 tablet   0   . IBUPROFEN 200 MG PO TABS   Oral   Take 800 mg by mouth every 6 (six) hours as needed. For pain         . OXYCODONE-ACETAMINOPHEN 10-325 MG PO TABS   Oral   Take 1 tablet by mouth every 4 (four) hours as needed for pain.   20 tablet   0     BP 117/65  Pulse 58  Temp 97.9 F (36.6 C) (Oral)  Resp 16  SpO2 96%  Physical Exam  Nursing note and vitals reviewed. Constitutional: He is oriented to person, place, and time. He appears well-developed and well-nourished.  HENT:  Head: Normocephalic and atraumatic.  Eyes: Pupils are equal, round, and reactive to light.  Neck: Normal range of motion. Neck supple.  Cardiovascular: Normal rate, regular rhythm and normal heart sounds.   Pulmonary/Chest: Breath sounds normal. No respiratory distress.  Abdominal: Soft. Bowel sounds are normal.  Musculoskeletal: He exhibits tenderness. He exhibits no edema.       Lumbar muscle spasms.  MOE x 4.  Good strength lower extremities.  Lymphadenopathy:    He has no cervical adenopathy.  Neurological: He is alert and oriented to person, place, and time.  Skin: Skin is warm and dry.  Psychiatric: He has a normal mood and affect. His behavior is normal. Judgment and thought content normal.    ED Course  Procedures (including critical care time)  Labs Reviewed - No data to display No results found.   No diagnosis found.  Previous records reviewed.  Patient is followed by Dr. 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  Discussed with Dr. Donia Guiles return to treatment via back pain protocol.  Will consider admission if patient remains significantly symptomatic despite treatment overnight.  MDM          Jimmye Norman, NP 06/21/12 [phone]

## 2012-06-20 NOTE — ED Provider Notes (Signed)
Anthony Harris is a 36 y.o. male placed in CDU on back pain protocol.  Patient is required 2 doses of Dilaudid and Valium.  Will discharge with pain medications and muscle relaxers.Patient with back pain.  No neurological deficits and normal neuro exam.  Patient can walk but states is painful.  No loss of bowel or bladder control.  No concern for cauda equina.  No fever, night sweats, weight loss, h/o cancer, IVDU.  RICE protocol and pain medicine indicated and discussed with patient.    Jaci Carrel, [place] 06/20/12 1541

## 2012-06-21 ENCOUNTER — Encounter (HOSPITAL_COMMUNITY): Payer: Self-pay | Admitting: Internal Medicine

## 2012-06-21 DIAGNOSIS — M5380 Other specified dorsopathies, site unspecified: Secondary | ICD-10-CM

## 2012-06-21 DIAGNOSIS — M62830 Muscle spasm of back: Secondary | ICD-10-CM

## 2012-06-21 DIAGNOSIS — M5126 Other intervertebral disc displacement, lumbar region: Secondary | ICD-10-CM | POA: Diagnosis present

## 2012-06-21 HISTORY — DX: Muscle spasm of back: M62.830

## 2012-06-21 MED ORDER — SODIUM CHLORIDE 0.9 % IJ SOLN
3.0000 mL | Freq: Two times a day (BID) | INTRAMUSCULAR | Status: DC
  Administered 2012-06-21 – 2012-06-22 (×2): 3 mL via INTRAVENOUS

## 2012-06-21 MED ORDER — CYCLOBENZAPRINE HCL 10 MG PO TABS
10.0000 mg | ORAL_TABLET | Freq: Four times a day (QID) | ORAL | Status: DC | PRN
  Administered 2012-06-21 – 2012-06-22 (×4): 10 mg via ORAL
  Filled 2012-06-21 (×4): qty 1

## 2012-06-21 MED ORDER — OXYCODONE HCL 5 MG PO TABS
10.0000 mg | ORAL_TABLET | ORAL | Status: DC | PRN
  Administered 2012-06-22 (×2): 10 mg via ORAL
  Filled 2012-06-21 (×2): qty 2

## 2012-06-21 MED ORDER — DIAZEPAM 5 MG/ML IJ SOLN
5.0000 mg | Freq: Four times a day (QID) | INTRAMUSCULAR | Status: DC
  Administered 2012-06-21 – 2012-06-22 (×3): 5 mg via INTRAVENOUS
  Filled 2012-06-21 (×3): qty 2

## 2012-06-21 MED ORDER — HYDROMORPHONE HCL PF 1 MG/ML IJ SOLN
1.0000 mg | Freq: Once | INTRAMUSCULAR | Status: AC
  Administered 2012-06-21: 1 mg via INTRAVENOUS
  Filled 2012-06-21: qty 1

## 2012-06-21 MED ORDER — ENOXAPARIN SODIUM 40 MG/0.4ML ~~LOC~~ SOLN
40.0000 mg | SUBCUTANEOUS | Status: DC
  Administered 2012-06-21 – 2012-06-22 (×2): 40 mg via SUBCUTANEOUS
  Filled 2012-06-21 (×3): qty 0.4

## 2012-06-21 MED ORDER — HYDROCOD POLST-CHLORPHEN POLST 10-8 MG/5ML PO LQCR
5.0000 mL | Freq: Two times a day (BID) | ORAL | Status: DC | PRN
  Administered 2012-06-22: 5 mL via ORAL
  Filled 2012-06-21: qty 5

## 2012-06-21 MED ORDER — SODIUM CHLORIDE 0.9 % IV SOLN: 250.0000 mL | INTRAVENOUS | Status: DC | PRN

## 2012-06-21 MED ORDER — DIAZEPAM 5 MG/ML IJ SOLN
5.0000 mg | Freq: Once | INTRAMUSCULAR | Status: AC
  Administered 2012-06-21: 5 mg via INTRAVENOUS
  Filled 2012-06-21: qty 2

## 2012-06-21 MED ORDER — SODIUM CHLORIDE 0.9 % IJ SOLN: 3.0000 mL | INTRAMUSCULAR | Status: DC | PRN

## 2012-06-21 MED ORDER — HYDROCOD POLST-CHLORPHEN POLST 10-8 MG/5ML PO LQCR
5.0000 mL | Freq: Once | ORAL | Status: AC
  Administered 2012-06-21: 5 mL via ORAL
  Filled 2012-06-21: qty 5

## 2012-06-21 NOTE — ED Notes (Signed)
MD at bedside. Consulting

## 2012-06-21 NOTE — ED Provider Notes (Signed)
Patient with a hx sig for disk protrusion was placed in CDU on Back pain protocol by Dr. Jeraldine Loots yesterday and then placed again on protocol after muscle spasms were triggered by a speed bump. Pt appears very reasonable and was looked up in the drug database with no history of narcotic perscriptions. When still patient is pain free, however any movement triggers severe pain.  Pt is followed by Dr. Yetta Barre Neuro Surgeon.  Patient re-evaluated and is resting comfortable, VSS, with no new complaints or concerns at this time. On re-exam there continues to be no focal neuro deficit.   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 Mild disc and facet  degeneration causing mild spinal stenosis at L2-3, L3-4, and L4-5.  Moderate to large left lateral disc protrusion L2-3 which could  affect the L2 nerve root.  Small right foraminal disc protrusion L5-S1.  Original Report Authenticated By: Camelia Phenes, M.D.   Pt will be admitted for pain management. The patient appears reasonably stabilized for admission considering the current resources, flow, and capabilities available in the ED at this time, and I doubt any other [facility] requiring further screening and/or treatment in the ED prior to admission.   Jaci Carrel, [place] 06/22/12 1605

## 2012-06-21 NOTE — ED Notes (Signed)
Pt awoke with spasms.

## 2012-06-21 NOTE — H&P (Signed)
Hospital Admission Note Date: 06/21/2012  Patient name: Anthony Harris Medical record number: [id number] Date of birth: [birth year] Age: 36 y.o. Gender: male PCP: No primary provider on file.  Medical Service: Internal medicine teaching service  Attending physician:  Dr. Dalphine Handing    1st Contact: Sadek    Pager: [phone] 2nd Contact: Manson Passey    Pager: [phone] After 5 pm or weekends: 1st Contact:      Pager: [phone] 2nd Contact:      Pager: [phone]  Chief Complaint: Acute back muscle spasm  History of Present Illness: Patient is a pleasant 36 year old man with past history of lumbar disc herniation who comes to the ED for acute back muscle spasm. He started having spasm on Tuesday- but was still able to function and move around although with protective gait. He went to work yesterday morning while still having muscle spasm. He had a bout of cough which triggered severe worsening of his muscle spasm which made him come to the ED. He denies any radiation of pain to lower extremities. Denies any tingling, numbness or weakness in lower extremities. Denies any loss of bowel or bladder control. Does not have history of kidney stones.  He has history of lumbar disc herniation and back pain along with spasm for about 6 years now. He has been followed by Dr. Yetta Barre with neurosurgery for this issue. He was getting epidural steroid injections until last year, which used to helped him for about 3 months at a time. He stopped getting injections after the fungal infection outbreak.  He has had cold for past few days and has been coughing whitish sputum. Denies any fever, chills, chest pain, short of breath, nausea, vomiting, abdominal pain, headache, vision changes, diarrhea.  Of note- patient with initially discharged from the ED yesterday afternoon after symptom control of muscle spasm, but have to come back in 30 minutes due to worsening spasm. Was kept in CDU under back pain protocol overnight but muscle  spasm and pain did not get much better and so was called for admission.  Meds: Current Outpatient Rx  Name  Route  Sig  Dispense  Refill  . DIAZEPAM 5 MG PO TABS   Oral   Take 1 tablet (5 mg total) by mouth every 8 (eight) hours as needed for anxiety.   12 tablet   0   . IBUPROFEN 200 MG PO TABS   Oral   Take 800 mg by mouth every 6 (six) hours as needed. For pain         . OXYCODONE-ACETAMINOPHEN 10-325 MG PO TABS   Oral   Take 1 tablet by mouth every 4 (four) hours as needed for pain.   20 tablet   0     Allergies: Allergies as of 06/20/2012  . (No Known Allergies)   Past Medical History  Diagnosis Date  . Anxiety   . Attention deficit disorder   . Lumbar disc herniation    Past Surgical History  Procedure Date  . Hernia repair    History reviewed. No pertinent family history. History   Social History  . Marital Status: Married    Spouse Name: N/A    Number of Children: N/A  . Years of Education: N/A   Occupational History  . Not on file.   Social History Main Topics  . Smoking status: Never Smoker   . Smokeless tobacco: Not on file  . Alcohol Use: Yes     Comment: Social drinking  . Drug  Use: No     Comment: No active drug use. History of past drug use.  Marland Kitchen Sexually Active: Yes   Other Topics Concern  . Not on file   Social History Narrative   Patient lives in [place] with his wife.Had education up to 10th grade.Working in [employer] business since he was 75 years old.Has 3 kids. All healthy.    Review of Systems:  as per history of present illness.   Physical Exam: Blood pressure 106/69, pulse 61, temperature 97.5 F (36.4 C), temperature source Oral, resp. rate 18, SpO2 99.00%. Constitutional: Vital signs reviewed.  Patient is a well-developed and well-nourished in intermittent acute distress due to muscle spasms but cooperative with exam. Alert and oriented x3.  Head: Normocephalic and atraumatic Mouth: no erythema or exudates,  MMM Eyes: PERRL, EOMI, conjunctivae normal, No scleral icterus.  Neck: Supple, Trachea midline normal ROM, No JVD  Cardiovascular: RRR, S1 normal, S2 normal, no MRG Pulmonary/Chest: CTAB, no wheezes, rales, or rhonchi Abdominal: Soft. Non-tender, non-distended, bowel sounds are normal Musculoskeletal: Muscle tightness and tenderness - bilateral paralumbar region.  Neurological: A&O x3, Strength is normal and symmetric bilaterally, cranial nerve II-XII are grossly intact, no focal motor deficit. Normal pinprick bilateral lower extremities. Hard to assess lower extremity strength due to acute back muscle spasm, but patient denies any weakness. Able to move his lower extremities.  Skin: Warm, dry and intact. No rash, cyanosis, or clubbing.  Psychiatric: Normal mood and affect. speech and behavior is normal. Judgment and thought content normal. Cognition and memory are normal.     Lab results:   Imaging results:  No results found.   Assessment & Plan by Problem: Principal Problem:  *Muscle spasm of back Active Problems:  Lumbar disc herniation  1) Acute back muscle spasm: Patient with history of moderate to large lateral disc protrusion- L2-3- per MRI in February 2012. As described in history of present illness, patient had multiple epidural steroid injections with symptom control.  This acute episode without any signs of neurologic deficit. No red flag signs for surgical intervention at this point of time- weakness, foot drop, sensation changes, loss of bowel or bladder control.  Plan: - Admit to regular bed for observation overnight. - Symptom control of spasm with diazepam 5 mg IV every 6 hours alternating with Flexeril 10 mg by mouth every 6 hours. - Pain management with Dilaudid 1 mg every 6 hours as needed and ketorolac 30 mg IV every 6 hours as needed. - No acute need of imaging at this point of time- although if warranted with clinical worsening, will need MRI lumbar spine. -  Outpatient followup with Dr. Yetta Barre with neurosurgery.   DVT prophylaxis: Lovenox.  Dispo: Disposition is deferred at this time, awaiting improvement of current medical problems. Anticipated discharge in approximately 1 day(s).   The patient does not have a current PCP (No primary provider on file.), therefore will not be requiring OPC follow-up after discharge.   The patient does not have transportation limitations that hinder transportation to clinic appointments.  Signed: Emmilia Sowder 06/21/2012, 10:01 AM

## 2012-06-21 NOTE — ED Notes (Signed)
Family at bedside. 

## 2012-06-22 ENCOUNTER — Encounter (HOSPITAL_COMMUNITY): Payer: Self-pay | Admitting: *Deleted

## 2012-06-22 DIAGNOSIS — M62838 Other muscle spasm: Secondary | ICD-10-CM

## 2012-06-22 DIAGNOSIS — M5126 Other intervertebral disc displacement, lumbar region: Secondary | ICD-10-CM

## 2012-06-22 LAB — BASIC METABOLIC PANEL
BUN: 14 mg/dL (ref 6–23)
GFR calc Af Amer: >90 mL/min (ref >90)
GFR calc non Af Amer: 90 mL/min — ABNORMAL LOW (ref >90)
Potassium: 4 mEq/L (ref 3.5–5.1)
Sodium: 138 mEq/L (ref 135–145)

## 2012-06-22 MED ORDER — NAPROXEN 500 MG PO TABS
500.0000 mg | ORAL_TABLET | Freq: Two times a day (BID) | ORAL | Status: DC
Start: 2012-06-22 — End: 2012-06-23
  Administered 2012-06-22 – 2012-06-23 (×3): 500 mg via ORAL
  Filled 2012-06-22 (×5): qty 1

## 2012-06-22 MED ORDER — DIAZEPAM 5 MG PO TABS
5.0000 mg | ORAL_TABLET | Freq: Four times a day (QID) | ORAL | Status: DC | PRN
  Administered 2012-06-22: 5 mg via ORAL
  Filled 2012-06-22: qty 1

## 2012-06-22 MED ORDER — KETOROLAC TROMETHAMINE 10 MG PO TABS: 10.0000 mg | ORAL_TABLET | ORAL | Status: DC | PRN

## 2012-06-22 MED ORDER — KETOROLAC TROMETHAMINE 10 MG PO TABS
10.0000 mg | ORAL_TABLET | ORAL | Status: DC | PRN
  Filled 2012-06-22: qty 1

## 2012-06-22 MED ORDER — CYCLOBENZAPRINE HCL 10 MG PO TABS
10.0000 mg | ORAL_TABLET | Freq: Four times a day (QID) | ORAL | Status: DC
  Administered 2012-06-22 – 2012-06-23 (×5): 10 mg via ORAL
  Filled 2012-06-22 (×8): qty 1

## 2012-06-22 MED ORDER — CYCLOBENZAPRINE HCL 10 MG PO TABS: 10.0000 mg | ORAL_TABLET | Freq: Four times a day (QID) | ORAL | Status: DC | PRN

## 2012-06-22 MED ORDER — DIAZEPAM 5 MG PO TABS
5.0000 mg | ORAL_TABLET | Freq: Four times a day (QID) | ORAL | Status: DC
Start: 2012-06-22 — End: 2012-06-23
  Administered 2012-06-22 – 2012-06-23 (×4): 5 mg via ORAL
  Filled 2012-06-22 (×4): qty 1

## 2012-06-22 MED ORDER — NAPROXEN 500 MG PO TABS: 500.0000 mg | ORAL_TABLET | Freq: Two times a day (BID) | ORAL | Status: DC

## 2012-06-22 NOTE — Discharge Summary (Signed)
Internal Medicine Teaching [facility] Discharge Note  Name: Anthony Harris MRN: [id number] DOB: 1976/02/14 36 y.o.  Date of Admission: 06/20/2012  6:03 PM Date of Discharge: 06/24/2012 Attending Physician: No att. providers found  Discharge Diagnosis: Principal Problem:  *Muscle spasm of back Active Problems:  Lumbar disc herniation   Discharge Medications:   Medication List     As of 06/24/2012  9:08 AM    TAKE these medications         cyclobenzaprine 10 MG tablet   Commonly known as: FLEXERIL   Take 1 tablet (10 mg total) by mouth every 6 (six) hours as needed for muscle spasms.      diazepam 5 MG tablet   Commonly known as: VALIUM   Take 1 tablet (5 mg total) by mouth every 8 (eight) hours as needed for anxiety.      ibuprofen 200 MG tablet   Commonly known as: ADVIL,MOTRIN   Take 800 mg by mouth every 6 (six) hours as needed. For pain      ketorolac 10 MG tablet   Commonly known as: TORADOL   Take 1 tablet (10 mg total) by mouth every 4 (four) hours as needed for pain.      naproxen 500 MG tablet   Commonly known as: NAPROSYN   Take 1 tablet (500 mg total) by mouth 2 (two) times daily with a meal.      oxyCODONE-acetaminophen 10-325 MG per tablet   Commonly known as: PERCOCET   Take 1 tablet by mouth every 4 (four) hours as needed for pain.         Disposition and follow-up:   Anthony Harris was discharged from [facility] in stable condition.  At the hospital follow up visit please address possible surgery and f/u with neurosurgery.  Follow-up Appointments: Follow-up Information    Follow up with Bradd Burner, PA. Schedule an appointment as soon as possible for a visit in 2 weeks.   Contact information:   [address], [address] [place] [address] [phone]       Follow up with [facility] . (for outpatient physical therapy )    Contact information:   [address]  [place] [phone]        Discharge Orders    Future Orders Please Complete By Expires   Diet - low sodium heart healthy      Increase activity slowly      Increase activity slowly      Discharge instructions      Comments:   You were hospitalized with a back spasm.  To treat this, do the following: 1. Take Valium, 1 tablet every 6 hours as needed for relief of spasm 2. Take Flexeril, 1 tablet every 6 hours as needed as a muscle relaxant 3. Take Oxycodone-acetaminophen, 1 tablet every 4 hours as needed for pain.   Call MD for:  severe uncontrolled pain      Call MD for:  temperature >100.4        Admission HPI: Patient is a pleasant 36 year old man with past history of lumbar disc herniation who comes to the ED for acute back muscle spasm. He started having spasm on Tuesday- but was still able to function and move around although with protective gait. He went to work yesterday morning while still having muscle spasm. He had a bout of cough which triggered severe worsening of his muscle spasm which made him  come to the ED.  He denies any radiation of pain to lower extremities. Denies any tingling, numbness or weakness in lower extremities. Denies any loss of bowel or bladder control. Does not have history of kidney stones.  He has history of lumbar disc herniation and back pain along with spasm for about 6 years now. He has been followed by Dr. Yetta Barre with neurosurgery for this issue. He was getting epidural steroid injections until last year, which used to helped him for about 3 months at a time. He stopped getting injections after the fungal infection outbreak.  He has had cold for past few days and has been coughing whitish sputum. Denies any fever, chills, chest pain, short of breath, nausea, vomiting, abdominal pain, headache, vision changes, diarrhea. Of note- patient with initially discharged from the ED yesterday afternoon after symptom control of muscle spasm, but have to  come back in 30 minutes due to worsening spasm. Was kept in CDU under back pain protocol overnight but muscle spasm and pain did not get much better and so was called for admission.   Hospital Course by problem list: 1) Acute back muscle spasm: Patient with history of moderate to large lateral disc protrusion- L2-3- per MRI in February 2012. Pt seen by neurosurgery and had multiple epidural steroid injections with symptom control until stopped this year as a personal decision. This acute episode without any signs of neurologic deficit; such as weakness, foot drop, sensation changes, or loss of bowel or bladder control. Pt received symptomatic control with diazepam 5 mg IV every 6 hours alternating with Flexeril 10 mg by mouth every 6 hours and original pain management with Dilaudid 1 mg every 6 hours as needed and ketorolac 30 mg IV every 6 hours as needed. This was transitioned to oral diazepam, toradol, and flexiril and provided same relief for pt upon d/c. Outpatient followup with Dr. Yetta Barre with neurosurgery recommended.  Discharge Vitals:  BP 105/63  Pulse 68  Temp 97.4 F (36.3 C) (Oral)  Resp 18  SpO2 97% General: resting in bed, NAD  HEENT: PERRL, EOMI, no scleral icterus  Cardiac: RRR, no rubs, murmurs or gallops  Pulm: clear to auscultation bilaterally, moving normal volumes of air  Abd: soft, nontender, nondistended, BS present  Ext: warm and well perfused, no pedal edema, muscle tenderness to deep palpation bilaterally paralumbar region  Neuro: alert and oriented X3, cranial nerves II-XII grossly intact  Discharge Labs:  No results found for this or any previous visit (from the past 24 hour(s)).  Signed: Christen Bame 06/24/2012, 9:08 AM   Time Spent on Discharge: 20 min Services Ordered on Discharge: none Equipment Ordered on Discharge: none

## 2012-06-22 NOTE — Progress Notes (Signed)
Received call from patient's nurse stating patient will need RW and outpatient physical therapy arranged. [facility] receives DME orders. Called [facility] DME rep to request walker to be delivered to patient's room. Also spoke with patient's wife to make aware that outpatient therapy contact information will be entered under discharge follow-up. Patient will need a prescription for outpatient therapy. Made patient's nurse aware. Raiford Noble, MSN, BSN, RN [phone]

## 2012-06-22 NOTE — Evaluation (Signed)
Physical Therapy Evaluation Patient Details Name: Anthony Harris MRN: [id number] DOB: 1976/06/14 Today's Date: 06/22/2012 Time: 4098-1191 PT Time Calculation (min): 19 min  PT Assessment / Plan / Recommendation Clinical Impression  36 y.o. male 6 yr h/o back pain 2* herniated disk at L2-3 admitted with muscle spasms in the back and inablilty to walk brought on by coughing. Instructed pt in log rolling and in use of RW. He was able to take a few steps with RW bed to chair with heavy use of BUEs on the walker. Pain 8/10. Outpatient PT recommended for back stretches/strengthening. Pt also interested in trial of TENS at outpt. He would benefit from acute PT to maximize safety and independence with mobility.     PT Assessment  Patient needs continued PT services    Follow Up Recommendations  Outpatient PT    Does the patient have the potential to tolerate intense rehabilitation      Barriers to Discharge None      Equipment Recommendations  Rolling walker with 5" wheels    Recommendations for Other Services     Frequency Min 3X/week    Precautions / Restrictions     Pertinent Vitals/Pain *8/10 central low back, pt denies radiating pain in to LEs, but does report intermittent pain into R groin premedicated**      Mobility  Bed Mobility Bed Mobility: Rolling Right;Right Sidelying to Sit Rolling Right: 5: Supervision;With rail Right Sidelying to Sit: 4: Min assist;With rails Details for Bed Mobility Assistance: VCs for log roll to right, min A to sit with heavy use of rails  Transfers Transfers: Sit to Stand;Stand to Sit Sit to Stand: 4: Min guard;From bed;With upper extremity assist Stand to Sit: 4: Min guard;With armrests;To chair/3-in-1 Details for Transfer Assistance: transfers are labored and painful  Ambulation/Gait Ambulation/Gait Assistance: 4: Min guard Ambulation Distance (Feet): 3 Feet Assistive device: Rolling walker Gait Pattern: Step-to pattern;Decreased  step length - left;Decreased stance time - right;Decreased step length - right General Gait Details: R knee tends to buckle, heavy use of BUEs with RW    Shoulder Instructions     Exercises     PT Diagnosis: Difficulty walking;Acute pain  PT Problem List: Decreased activity tolerance;Pain;Decreased mobility;Decreased knowledge of use of DME PT Treatment Interventions:     PT Goals Acute Rehab PT Goals PT Goal Formulation: With patient/family Time For Goal Achievement: 06/29/12 Potential to Achieve Goals: Good Pt will go Supine/Side to Sit: with modified independence;with HOB 0 degrees PT Goal: Supine/Side to Sit - Progress: Goal set today Pt will go Sit to Stand: with modified independence;with upper extremity assist PT Goal: Sit to Stand - Progress: Goal set today Pt will Ambulate: 16 - 50 feet;with modified independence;with rolling walker PT Goal: Ambulate - Progress: Goal set today  Visit Information  Last PT Received On: 06/22/12 Assistance Needed: +1    Subjective Data  Subjective: The spasms are a little better today and I can move my legs a little more today. Yesterday I was in tears with the pain.  Patient Stated Goal: return to work -Fish farm manager at [employer]   Prior [employer] Living Lives With: Spouse Available Help at Discharge: Family Home Access: Stairs to enter Secretary/administrator of Steps: 6 Entrance Stairs-Rails: Right Home Layout: One level Home Adaptive Equipment: None Prior Function Level of Independence: Independent Able to Take Stairs?: Yes Driving: Yes Vocation: Full time employment Comments: foreman at [employer] Communication Communication: No difficulties  Cognition  Overall Cognitive Status: Appears within functional limits for tasks assessed/performed Arousal/Alertness: Awake/alert Orientation Level: Appears intact for tasks assessed Behavior During Session: [facility] for tasks performed     Extremity/Trunk Assessment Right Upper Extremity Assessment RUE ROM/Strength/Tone: [facility] for tasks assessed Left Upper Extremity Assessment LUE ROM/Strength/Tone: WFL for tasks assessed Right Lower Extremity Assessment RLE ROM/Strength/Tone: Within functional levels (4/5 knee ext, R knee buckles in standing though ) RLE Sensation: WFL - Light Touch RLE Coordination: WFL - gross/fine motor Left Lower Extremity Assessment LLE ROM/Strength/Tone: Within functional levels LLE Sensation: WFL - Light Touch LLE Coordination: WFL - gross/fine motor Trunk Assessment Trunk Assessment: Normal (decreased lumbar lordosis)   Balance Balance Balance Assessed: Yes Static Sitting Balance Static Sitting - Balance Support: Bilateral upper extremity supported;Feet supported Static Sitting - Level of Assistance: 7: Independent Static Sitting - Comment/# of Minutes: 3 , pt tends to [employer] through BUEs  End of Session PT - End of Session Activity Tolerance: Patient limited by pain Patient left: in chair;with call bell/phone within reach;with family/visitor present Nurse Communication: Mobility status  GP Functional Assessment Tool Used: clinical judgement Functional Limitation: Mobility: Walking and moving around Mobility: Walking and Moving Around Current Status (Z6109): At least 60 percent but less than 80 percent impaired, limited or restricted Mobility: Walking and Moving Around Goal Status [phone]): At least 1 percent but less than 20 percent impaired, limited or restricted   Tamala Ser 06/22/2012, 10:37 AM [phone]
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
## 2012-06-22 NOTE — H&P (Signed)
Internal Medicine Teaching Service Attending Note Date: 06/22/2012  Patient name: Anthony Harris  Medical record number: [id number]  Date of birth: 1975-11-29   Chief Complaint: Back pain . History of Present Illness The patient, Anthony Harris, is a 36 y.o. year old male who comes in with the chief complaint of back pain. I have read the HPI written by Dr. Allena Katz and I concur with it. When I met the patient today, the patient was still in pain, lying sideways in bed but admitted that the pain was better and he wanted to see how he did when we transitioned him to oral pain medications. He will be working with physical therapy today. He denies any weakness in his limbs, paralysis, seizures, chest pain or abdominla pain, numbness or tingling sensation in his legs, any bowel or bladder abnormalities or any change in appetite. Denies fever chills nausea or vomiting. Denies headaches, neck stiffness or confusion. He has some cough and the pain is worsened when he tried to cough.  Past Medical History   has a past medical history of Anxiety; Attention deficit disorder; and Lumbar disc herniation.  Medications  Reviewed  Family History family history is not on file.  Social History  reports that he has never smoked. He does not have any smokeless tobacco history on file. He reports that he drinks alcohol. He reports that he does not use illicit drugs.  Review of Systems Positive for - back pain, cough Negative for - as per HPI  Vital Signs: Filed Vitals:   06/22/12 0518  BP: 102/59  Pulse: 50  Temp: 97.5 F (36.4 C)  Resp: 18    Physical Exam:  I met with patient around 830 am today  Vitals reviewed.  General: Resting in bed, seems distressed, has pain in the back with coughing. HEENT: PERRL, EOMI, no scleral icterus. Heart: RRR, no rubs, murmurs or gallops. Lungs: Clear to auscultation bilaterally, no wheezes, rales, or rhonchi. Abdomen: Soft, nontender, nondistended, BS  present. Extremities: Warm, no pedal edema. Neuro: Alert and oriented X3, cranial nerves II-XII grossly intact,  strength and sensation to light touch equal in bilateral upper and lower extremities Back/spine: tender to touch lumbar spine and paravertebral muscles surrounding. I did not feel any knots, fluctuant areas or masses.   Lab results: CMP     Component Value Date/Time   NA 138 06/22/2012 0630   K 4.0 06/22/2012 0630   CL 104 06/22/2012 0630   CO2 27 06/22/2012 0630   GLUCOSE 88 06/22/2012 0630   BUN 14 06/22/2012 0630   CREATININE 1.04 06/22/2012 0630   CALCIUM 8.8 06/22/2012 0630   PROT 7.5 09/12/2011 0916   ALBUMIN 4.1 09/12/2011 0916   AST 22 09/12/2011 0916   ALT 23 09/12/2011 0916   ALKPHOS 58 09/12/2011 0916   BILITOT 0.6 09/12/2011 0916   GFRNONAA 90* 06/22/2012 0630   GFRAA >90 06/22/2012 0630   CBC    Component Value Date/Time   WBC 22.0* 09/12/2011 0916   RBC 5.14 09/12/2011 0916   HGB 16.5 09/12/2011 0916   HCT 46.5 09/12/2011 0916   PLT 276 09/12/2011 0916   MCV 90.5 09/12/2011 0916   MCH 32.1 09/12/2011 0916   MCHC 35.5 09/12/2011 0916   RDW 12.8 09/12/2011 0916   LYMPHSABS 1.0 09/12/2011 0916   MONOABS 1.5* 09/12/2011 0916   EOSABS 0.2 09/12/2011 0916   BASOSABS 0.0 09/12/2011 0916    Urinalysis    Component Value Date/Time  COLORURINE YELLOW 09/12/2011 0919   APPEARANCEUR CLEAR 09/12/2011 0919   LABSPEC 1.017 09/12/2011 0919   PHURINE 8.5* 09/12/2011 0919   GLUCOSEU NEGATIVE 09/12/2011 0919   HGBUR NEGATIVE 09/12/2011 0919   BILIRUBINUR NEGATIVE 09/12/2011 0919   KETONESUR NEGATIVE 09/12/2011 0919   PROTEINUR 30* 09/12/2011 0919   UROBILINOGEN 0.2 09/12/2011 0919   NITRITE NEGATIVE 09/12/2011 0919   LEUKOCYTESUR NEGATIVE 09/12/2011 0919     Imaging results:  MRI(08/26/10) IMPRESSION: 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 Mild disc and facet degeneration causing mild spinal stenosis at L2-3, L3-4, and L4-5. Moderate to large left lateral disc protrusion L2-3 which could affect the L2  nerve root. Small right foraminal disc protrusion L5-S1.  Assessment and Plan:  Severe acute on chronic pain due to lumbar spinal stenosis and disc degeneration. The patient needs pain control acutely, until he can be transitioned to oral pain medications and discharged. He does not have any acute neurological abnormalities. I see no red flags for surgical involvement as Dr. Allena Katz correctly points out. The patient has a neurosurgeon, Dr. Yetta Barre, that he follows up with and he will need a follow up appointment upon discharge. We will ask PT to work with him. If any worsening, MRI lumbar spine will be done as an inpatient.   Other chronic issues per resident note.   Thanks, Aletta Edouard, MD 12/14/20139:37 AM

## 2012-06-23 MED ORDER — OXYCODONE-ACETAMINOPHEN 10-325 MG PO TABS: 1.0000 | ORAL_TABLET | ORAL | Status: DC | PRN

## 2012-06-23 NOTE — Progress Notes (Signed)
NCM spoke to wife and they agreeable to outpt PT. Information on center to contact on Monday. NCM will fax referral to [facility], [phone]. Pt has RW in room from [facility]. Isidoro Donning RN CCM Case Mgmt phone [phone]
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
## 2012-06-23 NOTE — ED Provider Notes (Signed)
This was a shared encounter.  This patient was on the back pain protocol, and discharged after significant improvement.  However, in route, the patient had a aggravating event, presents with new spasm.  The patient was admitted for further evaluation and management, including pain control to  Gerhard Munch, MD 06/23/12 [phone]

## 2012-06-24 NOTE — ED Provider Notes (Signed)
Medical screening examination/treatment/procedure(s) were performed by non-physician practitioner and as supervising physician I was immediately available for consultation/collaboration.  Raeford Razor, MD 06/24/12 [phone]

## 2012-06-24 NOTE — Discharge Summary (Signed)
Internal Medicine Teaching Service Attending Note Date: 06/24/2012  Patient name: Anthony Harris  Medical record number: [id number]  Date of birth: [birth year]    I evaluated the patient on the day of discharge and discussed the discharge plan with my resident team. I agree with the discharge documentation and disposition.  Thank you for working with me on this patient's care.   Thanks Aletta Edouard 06/24/2012, 11:51 AM

## 2012-06-27 ENCOUNTER — Emergency Department (HOSPITAL_COMMUNITY): Payer: 59

## 2012-06-27 ENCOUNTER — Encounter (HOSPITAL_COMMUNITY): Payer: Self-pay | Admitting: *Deleted

## 2012-06-27 ENCOUNTER — Emergency Department (HOSPITAL_COMMUNITY)
Admission: EM | Admit: 2012-06-27 | Discharge: 2012-06-27 | Disposition: A | Discharge status: Home / Self Care | Payer: 59 | Attending: Emergency Medicine | Admitting: Emergency Medicine

## 2012-06-27 DIAGNOSIS — F411 Generalized anxiety disorder: Secondary | ICD-10-CM | POA: Insufficient documentation

## 2012-06-27 DIAGNOSIS — M549 Dorsalgia, unspecified: Secondary | ICD-10-CM

## 2012-06-27 DIAGNOSIS — M545 Low back pain, unspecified: Secondary | ICD-10-CM | POA: Insufficient documentation

## 2012-06-27 DIAGNOSIS — F988 Other specified behavioral and emotional disorders with onset usually occurring in childhood and adolescence: Secondary | ICD-10-CM | POA: Insufficient documentation

## 2012-06-27 DIAGNOSIS — Z8739 Personal history of other diseases of the musculoskeletal system and connective tissue: Secondary | ICD-10-CM | POA: Insufficient documentation

## 2012-06-27 MED ORDER — DIAZEPAM 5 MG PO TABS: 5.0000 mg | ORAL_TABLET | Freq: Three times a day (TID) | ORAL | Status: DC | PRN

## 2012-06-27 MED ORDER — DEXAMETHASONE SODIUM PHOSPHATE 10 MG/ML IJ SOLN
10.0000 mg | Freq: Once | INTRAMUSCULAR | Status: AC
  Administered 2012-06-27: 10 mg via INTRAVENOUS
  Filled 2012-06-27: qty 1

## 2012-06-27 MED ORDER — CYCLOBENZAPRINE HCL 10 MG PO TABS: 10.0000 mg | ORAL_TABLET | Freq: Two times a day (BID) | ORAL | Status: DC | PRN

## 2012-06-27 MED ORDER — OXYCODONE-ACETAMINOPHEN 10-325 MG PO TABS: 1.0000 | ORAL_TABLET | Freq: Four times a day (QID) | ORAL | Status: DC | PRN

## 2012-06-27 MED ORDER — HYDROMORPHONE HCL PF 1 MG/ML IJ SOLN
1.0000 mg | Freq: Once | INTRAMUSCULAR | Status: AC
  Administered 2012-06-27: 1 mg via INTRAVENOUS
  Filled 2012-06-27: qty 1

## 2012-06-27 MED ORDER — ONDANSETRON HCL 4 MG/2ML IJ SOLN
4.0000 mg | Freq: Once | INTRAMUSCULAR | Status: AC
  Administered 2012-06-27: 4 mg via INTRAVENOUS
  Filled 2012-06-27: qty 2

## 2012-06-27 MED ORDER — METHYLPREDNISOLONE 4 MG PO KIT: PACK | ORAL | Status: DC

## 2012-06-27 NOTE — ED Notes (Signed)
Pt sleeping pain better wife at the bedside

## 2012-06-27 NOTE — ED Provider Notes (Signed)
History/physical exam/procedure(s) were performed by non-physician practitioner and as supervising physician I was immediately available for consultation/collaboration. I have reviewed all notes and am in agreement with care and plan.   Hilario Quarry, MD 06/27/12 [phone]

## 2012-06-27 NOTE — ED Notes (Signed)
Pt awaiting MRI 

## 2012-06-27 NOTE — ED Provider Notes (Signed)
History     CSN: [id number]  Arrival date & time 06/27/12  1158   First MD Initiated Contact with Patient 06/27/12 1313      Chief Complaint  Patient presents with  . Back Pain    (Consider location/radiation/quality/duration/timing/severity/associated sxs/prior treatment) HPI Comments: Patient with history of bulging disc presents with return of back pain. Patient was seen in emergency department last week and was admitted for 3 days due to intractable back pain. Patient was not seen by a neurosurgeon in the hospital. He has been seen by Dr. Yetta Barre in the past. Up until a year ago, he received injections in his back due to pain. Patient had gradual increase of back pain in the left lower back that was improved from the hospital but after his return home the pain gradually became worse. Patient states he is unable to walk today. Pain is made worse with walking. He has been on Toradol, ibuprofen, Valium, Flexeril, Percocet at home. He was scheduled to follow up with neurosurgery next month. His wife states that they called the neurosurgery office and was told to come to the emergency department for evaluation. No red flag s/s of low back pain. Onset gradual. Course is waxing and waning. Nothing makes symptoms better.   The history is provided by the patient.    Past Medical History  Diagnosis Date  . Anxiety   . Attention deficit disorder   . Lumbar disc herniation     Past Surgical History  Procedure Date  . Hernia repair     No family history on file.  History  Substance Use Topics  . Smoking status: Never Smoker   . Smokeless tobacco: Not on file  . Alcohol Use: Yes     Comment: Social drinking      Review of Systems  Constitutional: Negative for fever and unexpected weight change.  HENT: Negative for sore throat and rhinorrhea.   Eyes: Negative for redness.  Respiratory: Negative for cough.   Cardiovascular: Negative for chest pain.  Gastrointestinal: Negative for  nausea, vomiting, abdominal pain, diarrhea and constipation.       Neg for fecal incontinence  Genitourinary: Negative for dysuria, hematuria, flank pain and difficulty urinating.       Negative for urinary incontinence or retention  Musculoskeletal: Positive for back pain. Negative for myalgias.  Skin: Negative for rash.  Neurological: Negative for weakness, numbness and headaches.       Negative for saddle paresthesias     Allergies  Review of patient's allergies indicates no known allergies.  Home Medications   Current Outpatient Rx  Name  Route  Sig  Dispense  Refill  . CYCLOBENZAPRINE HCL 10 MG PO TABS   Oral   Take 1 tablet (10 mg total) by mouth every 6 (six) hours as needed for muscle spasms.   30 tablet   0   . DIAZEPAM 5 MG PO TABS   Oral   Take 1 tablet (5 mg total) by mouth every 8 (eight) hours as needed for anxiety.   12 tablet   0   . IBUPROFEN 200 MG PO TABS   Oral   Take 800 mg by mouth every 6 (six) hours as needed. For pain         . KETOROLAC TROMETHAMINE 10 MG PO TABS   Oral   Take 1 tablet (10 mg total) by mouth every 4 (four) hours as needed for pain.   20 tablet   0   .  NAPROXEN 500 MG PO TABS   Oral   Take 1 tablet (500 mg total) by mouth 2 (two) times daily with a meal.   30 tablet   0   . OXYCODONE-ACETAMINOPHEN 10-325 MG PO TABS   Oral   Take 1 tablet by mouth every 4 (four) hours as needed for pain.   45 tablet   0     BP 121/70  Pulse 84  Temp 97.4 F (36.3 C) (Oral)  Resp 19  Ht 5\' 10"  (1.778 m)  Wt 185 lb (83.915 kg)  BMI 26.54 kg/m2  SpO2 96%  Physical Exam  Nursing note and vitals reviewed. Constitutional: He appears well-developed and well-nourished.  HENT:  Head: Normocephalic and atraumatic.  Eyes: Conjunctivae normal are normal.  Neck: Normal range of motion.  Abdominal: Soft. There is no tenderness. There is no CVA tenderness.  Musculoskeletal: He exhibits no tenderness.       Cervical back: He exhibits  normal range of motion, no tenderness and no bony tenderness.       Thoracic back: He exhibits normal range of motion, no tenderness and no bony tenderness.       Lumbar back: He exhibits decreased range of motion and tenderness. He exhibits no bony tenderness.       Back:       No step-off noted with palpation of spine. No percussion tenderness.   Neurological: He is alert. He has normal reflexes. No sensory deficit. He exhibits normal muscle tone.       5/5 strength in entire lower extremities bilaterally. No sensation deficit.   Skin: Skin is warm and dry.  Psychiatric: He has a normal mood and affect.    ED Course  Procedures (including critical care time)  Labs Reviewed - No data to display No results found.   1. Back pain     1:27 PM Patient seen and examined. Medications ordered.   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 Mild disc and facet  degeneration causing mild spinal stenosis at L2-3, L3-4, and L4-5.  2.) Moderate to large left lateral disc protrusion L2-3 which could  affect the L2 nerve root.  3.) Small right foraminal disc protrusion L5-S1.  4:30 PM I spoke with Dr. Yetta Barre. Recc: MRI. Will notify if something different that needs to be addressed. Otherwise neuro f/u as planned in January.   Patient informed of plan. He is feeling somewhat better after pain medication.   Handoff: Smith NP in CDU.   Pending MRI.    MDM  Pending MRI for evaluation of intractable back pain.         Renne Crigler, [place] 06/27/12 [phone]

## 2012-06-27 NOTE — ED Notes (Signed)
Patient was recent inpatient for buldging disk with pain.  Patient states he has returned severe pain and he cannot walk today due to pain.  Patient states he called the neuro office due to returned pain.  He was advised to return to ED for follow up and possible eval by on call neuro surgeon.  Patient denies any loss of control of urine/stools.  No numbness

## 2012-06-27 NOTE — ED Notes (Signed)
Family at bedside. 

## 2012-06-27 NOTE — ED Notes (Signed)
Pt reports tightness in lower back. Pt reports known bulging disk L2-3. Difficulty walking

## 2012-06-27 NOTE — ED Provider Notes (Signed)
MRI results reviewed and discussed with Dr. Rosalia Hammers, shared with patient--"Enlargement of a right posterolateral disc herniation at L5-S1 with potential to affect the right S1 nerve root. No significant change in disc bulges at other levels and some facet arthropathy throughout the lumbar region without slippage or significant encroachment upon the neural spaces."  Patient has follow-up scheduled with Dr. Yetta Barre.  Plan:  10 mg decadron IV now, medrol dose pack, continuation of flexeril, diazepam, percocet as needed.   Jimmye Norman, NP 06/27/12 2217

## 2012-06-27 NOTE — ED Notes (Signed)
Pt waiting for a mri

## 2012-06-30 NOTE — ED Provider Notes (Signed)
Medical screening examination/treatment/procedure(s) were performed by non-physician practitioner and as supervising physician I was immediately available for consultation/collaboration.   Harout Scheurich, MD 06/30/12 0007 

## 2013-07-03 ENCOUNTER — Emergency Department (HOSPITAL_COMMUNITY): Payer: 59

## 2013-07-03 ENCOUNTER — Emergency Department (HOSPITAL_COMMUNITY)
Admission: EM | Admit: 2013-07-03 | Discharge: 2013-07-03 | Disposition: A | Discharge status: Home / Self Care | Payer: 59 | Attending: Emergency Medicine | Admitting: Emergency Medicine

## 2013-07-03 ENCOUNTER — Encounter (HOSPITAL_COMMUNITY): Payer: Self-pay | Admitting: Emergency Medicine

## 2013-07-03 DIAGNOSIS — Z9889 Other specified postprocedural states: Secondary | ICD-10-CM | POA: Insufficient documentation

## 2013-07-03 DIAGNOSIS — Z8739 Personal history of other diseases of the musculoskeletal system and connective tissue: Secondary | ICD-10-CM | POA: Insufficient documentation

## 2013-07-03 DIAGNOSIS — Z8659 Personal history of other mental and behavioral disorders: Secondary | ICD-10-CM | POA: Insufficient documentation

## 2013-07-03 DIAGNOSIS — K4090 Unilateral inguinal hernia, without obstruction or gangrene, not specified as recurrent: Secondary | ICD-10-CM | POA: Insufficient documentation

## 2013-07-03 LAB — URINALYSIS, ROUTINE W REFLEX MICROSCOPIC
Glucose, UA: NEGATIVE mg/dL
Hgb urine dipstick: NEGATIVE
Ketones, ur: NEGATIVE mg/dL
Protein, ur: NEGATIVE mg/dL

## 2013-07-03 LAB — CBC WITH DIFFERENTIAL/PLATELET
Basophils Relative: 0 % (ref 0–1)
Eosinophils Absolute: 0.1 10*3/uL (ref 0.0–0.7)
Eosinophils Relative: 1 % (ref 0–5)
Lymphs Abs: 2.5 10*3/uL (ref 0.7–4.0)
MCH: 32.2 pg (ref 26.0–34.0)
MCHC: 35.4 g/dL (ref 30.0–36.0)
MCV: 91.2 fL (ref 78.0–100.0)
Monocytes Relative: 9 % (ref 3–12)
Platelets: 267 10*3/uL (ref 150–400)
RBC: 4.87 MIL/uL (ref 4.22–5.81)

## 2013-07-03 LAB — BASIC METABOLIC PANEL
BUN: 13 mg/dL (ref 6–23)
CO2: 30 mEq/L (ref 19–32)
Calcium: 9.6 mg/dL (ref 8.4–10.5)
Glucose, Bld: 92 mg/dL (ref 70–99)
Sodium: 142 mEq/L (ref 135–145)

## 2013-07-03 MED ORDER — IOHEXOL 300 MG/ML  SOLN
100.0000 mL | Freq: Once | INTRAMUSCULAR | Status: AC | PRN
  Administered 2013-07-03: 100 mL via INTRAVENOUS

## 2013-07-03 MED ORDER — SODIUM CHLORIDE 0.9 % IV SOLN
1000.0000 mL | INTRAVENOUS | Status: DC
  Administered 2013-07-03: 1000 mL via INTRAVENOUS

## 2013-07-03 MED ORDER — SODIUM CHLORIDE 0.9 % IV SOLN
1000.0000 mL | Freq: Once | INTRAVENOUS | Status: AC
  Administered 2013-07-03: 1000 mL via INTRAVENOUS

## 2013-07-03 MED ORDER — ONDANSETRON 8 MG PO TBDP: 8.0000 mg | ORAL_TABLET | Freq: Three times a day (TID) | ORAL | Status: DC | PRN

## 2013-07-03 MED ORDER — ONDANSETRON HCL 4 MG/2ML IJ SOLN
4.0000 mg | Freq: Once | INTRAMUSCULAR | Status: AC
  Administered 2013-07-03: 4 mg via INTRAVENOUS
  Filled 2013-07-03: qty 2

## 2013-07-03 MED ORDER — MORPHINE SULFATE 4 MG/ML IJ SOLN
4.0000 mg | Freq: Once | INTRAMUSCULAR | Status: AC
  Administered 2013-07-03: 4 mg via INTRAVENOUS
  Filled 2013-07-03: qty 1

## 2013-07-03 MED ORDER — HYDROCODONE-ACETAMINOPHEN 5-325 MG PO TABS: 1.0000 | ORAL_TABLET | ORAL | Status: DC | PRN

## 2013-07-03 NOTE — ED Notes (Signed)
Pt states he was doing some lifting yesterday and began having some rt groin pain, pt has hx of hernia so he thought that that was what the pain was from. Pt states he developed some nausea/vomiting and was concerned he may have appendicitis so he came here. Pt rates pain 5/10.

## 2013-07-03 NOTE — ED Notes (Signed)
Patient presents today with a chief complaint of RLQ groin and abdominal pain since yesterday afternoon. Patient has a history of inguinal hernia, patient also reporting several episodes of vomiting and diaphoresis. Patient denies taking any OTC meds for nausea/vomiting.

## 2013-07-03 NOTE — ED Notes (Signed)
Ct notified pt finished with contrast

## 2013-07-03 NOTE — ED Notes (Signed)
Dr. Lynelle Doctor notified pt HR in the 40's. Pt not symptomatic.

## 2013-07-03 NOTE — ED Provider Notes (Signed)
CSN: [id number]     Arrival date & time 07/03/13  1534 History  First MD Initiated Contact with Patient 07/03/13 1551     Chief Complaint  Patient presents with  . Abdominal Pain    Patient is a 37 y.o. male presenting with abdominal pain. The history is provided by the patient.  Abdominal Pain Pain location:  RLQ Pain quality: aching   Pain radiates to:  Does not radiate Pain severity:  Moderate Onset quality:  Sudden Duration:  1 day Timing:  Constant Context: retching   Context: not eating, not sick contacts and not trauma   Relieved by:  Nothing Worsened by:  Nothing tried Associated symptoms: anorexia, diarrhea, nausea and vomiting (yesterday, none today)   Associated symptoms: no cough and no fever (although felt warm)   Pt also felt a lump in the right groin and has history of hernia surgery on the other side.  Past Medical History  Diagnosis Date  . Anxiety   . Attention deficit disorder   . Lumbar disc herniation    Past Surgical History  Procedure Laterality Date  . Hernia repair     No family history on file. History  Substance Use Topics  . Smoking status: Never Smoker   . Smokeless tobacco: Not on file  . Alcohol Use: Yes     Comment: Social drinking    Review of Systems  Constitutional: Negative for fever (although felt warm).  Respiratory: Negative for cough.   Gastrointestinal: Positive for nausea, vomiting (yesterday, none today), abdominal pain, diarrhea and anorexia.  All other systems reviewed and are negative.    Allergies  Review of patient's allergies indicates no known allergies.  Home Medications   Current Outpatient Rx  Name  Route  Sig  Dispense  Refill  . Pseudoephedrine HCl (SUDAFED PO)   Oral   Take 2 tablets by mouth daily as needed (congestion).         . pseudoephedrine-guaifenesin (MUCINEX D) 60-600 MG per tablet   Oral   Take 1 tablet by mouth every 12 (twelve) hours as needed for congestion (cough).         .  Tetrahydrozoline HCl (VISINE OP)   Both Eyes   Place 1 drop into both eyes 2 (two) times daily as needed (protection against dust at work).         Marland Kitchen HYDROcodone-acetaminophen (NORCO/VICODIN) 5-325 MG per tablet   Oral   Take 1-2 tablets by mouth every 4 (four) hours as needed.   16 tablet   0   . ondansetron (ZOFRAN ODT) 8 MG disintegrating tablet   Oral   Take 1 tablet (8 mg total) by mouth every 8 (eight) hours as needed for nausea or vomiting.   20 tablet   0    BP 103/65  Pulse 51  Temp(Src) 98.1 F (36.7 C) (Oral)  Resp 18  Ht 5\' 11"  (1.803 m)  Wt 186 lb 8 oz (84.596 kg)  BMI 26.02 kg/m2  SpO2 98% Physical Exam  Nursing note and vitals reviewed. Constitutional: He appears well-developed and well-nourished. No distress.  HENT:  Head: Normocephalic and atraumatic.  Right Ear: External ear normal.  Left Ear: External ear normal.  Eyes: Conjunctivae are normal. Right eye exhibits no discharge. Left eye exhibits no discharge. No scleral icterus.  Neck: Neck supple. No tracheal deviation present.  Cardiovascular: Normal rate, regular rhythm and intact distal pulses.   Pulmonary/Chest: Effort normal and breath sounds normal. No stridor. No respiratory  distress. He has no wheezes. He has no rales.  Abdominal: Soft. Bowel sounds are normal. He exhibits no distension. There is tenderness in the right lower quadrant. There is guarding. There is no rebound. A hernia (palpable with coughing, spontaneously reduces) is present. Hernia confirmed positive in the right inguinal area.  Musculoskeletal: He exhibits no edema and no tenderness.  Neurological: He is alert. He has normal strength. No sensory deficit. Cranial nerve deficit:  no gross defecits noted. He exhibits normal muscle tone. He displays no seizure activity. Coordination normal.  Skin: Skin is warm and dry. No rash noted.  Psychiatric: He has a normal mood and affect.    ED Course  Procedures (including critical care  time) Labs Review Labs Reviewed  BASIC METABOLIC PANEL - Abnormal; Notable for the following:    GFR calc non Af Amer 74 (*)    GFR calc Af Amer 86 (*)    All other components within normal limits  URINALYSIS, ROUTINE W REFLEX MICROSCOPIC - Abnormal; Notable for the following:    APPearance CLOUDY (*)    All other components within normal limits  CBC WITH DIFFERENTIAL   Imaging Review Ct Abdomen Pelvis W Contrast  07/03/2013   CLINICAL DATA:  Right lower quadrant abdominal pain since yesterday, vomiting, diaphoresis, question appendicitis, past history of inguinal hernia  EXAM: CT ABDOMEN AND PELVIS WITH CONTRAST  TECHNIQUE: Multidetector CT imaging of the abdomen and pelvis was performed using the standard protocol following bolus administration of intravenous contrast. Sagittal and coronal MPR images reconstructed from axial data set.  CONTRAST:  OMNIPAQUE IOHEXOL 300 MG/ML SOLN. Dilute oral contrast.  COMPARISON:  None  FINDINGS: Dependent atelectasis at both lung bases.  Small right renal cyst 9 mm diameter image 34.  Liver, spleen, pancreas, kidneys, and adrenal glands otherwise unremarkable.  Normal appendix.  Stomach and bowel loops normal appearance.  Unremarkable bladder and ureters.  No mass, adenopathy, free fluid or free air.  Small amount of infiltration of fat in the lateral right pelvis is seen, lateral to normal appearing small bowel loops and inferior to an unremarkable cecum and appendix, nonspecific.  Minimal dilatation of the right inguinal canal containing fat.  No acute osseous findings.  IMPRESSION: Normal appendix.  Mild infiltration of fat in the lateral right pelvis, uncertain etiology, could potentially be related to a very small right inguinal hernia containing fat or a nonspecific inflammatory process.  No other intra-abdominal or intrapelvic abnormalities identified.   Electronically Signed   By: Ulyses Southward M.D.   On: 07/03/2013 19:25    EKG Interpretation    None       MDM   1. Inguinal hernia, right    On exam the patient has a reducible right inguinal hernia. CT scan does not show any evidence of appendicitis or incarcerated hernia. Possible that the symptoms started off as a viral gastroenteritis type illness. His vomiting and diarrhea may have exacerbated the inguinal hernia. Patient is stable for discharge home with medications for pain and nausea. I discussed the plan with the patient and his wife.  Outpatient referral to [facility] [place] [facility]   Celene Kras, MD 07/03/13 2001

## 2013-07-14 ENCOUNTER — Encounter (INDEPENDENT_AMBULATORY_CARE_PROVIDER_SITE_OTHER): Payer: Self-pay | Admitting: General Surgery

## 2013-07-14 ENCOUNTER — Ambulatory Visit (INDEPENDENT_AMBULATORY_CARE_PROVIDER_SITE_OTHER): Payer: 59 | Admitting: General Surgery

## 2013-07-14 VITALS — BP 120/70 | HR 90 | Temp 98.2°F | Resp 18 | Ht 70.0 in | Wt 188.0 lb

## 2013-07-14 DIAGNOSIS — K4090 Unilateral inguinal hernia, without obstruction or gangrene, not specified as recurrent: Secondary | ICD-10-CM | POA: Insufficient documentation

## 2013-07-14 HISTORY — DX: Unilateral inguinal hernia, without obstruction or gangrene, not specified as recurrent: K40.90

## 2013-07-14 NOTE — Progress Notes (Signed)
Patient ID: Anthony Harris, male   DOB: Mar 20, 1976, 38 y.o.   MRN: [id number]  No chief complaint on file.   HPI Anthony GrossWilliam C Harris is a 38 y.o. male.  He is referred by Dr. Linwood DibblesJon Knapp in the emergency department for evaluation of a symptomatic right inguinal hernia. He does not have a PCP. He is followed by Dr. Marikay Alaravid Jones for his chronic lumbar back problems.  He has a history of left ingu[place], probably [place] mesh[place] ago. He does not recall his Careers advisersurgeon. No problems on the left. He now gives a two-week history of a painful bulge in his right groin. With this initially happened it was very painful. He developed nausea vomiting and diarrhea that day as well. He went to the emergency department. A right inguinal hernia was diagnosed on physical exam. A CT scan showed mild infiltration of fat in the lateral right pelvis uncertain etiology. A very small hernia was theorized is one possibility.  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 Normal appetite. Normal bowel and bladder habits.He would like to have this repaired  Comorbidities reveal attention deficit disorder. Lumbar disc problems, he has received a few epidural steroid injections. Recent upper respiratory infection risk, resolving   HPI  Past Medical History  Diagnosis Date  . Anxiety   . Attention deficit disorder   . Lumbar disc herniation   . GERD (gastroesophageal reflux disease)     Past Surgical History  Procedure Laterality Date  . Hernia repair      No family history on file.  Social History History  Substance Use Topics  . Smoking status: Former Games developermoker  . Smokeless tobacco: Not on file  . Alcohol Use: Yes     Comment: Social drinking    No Known Allergies  No current outpatient prescriptions on file.   No current facility-administered medications for this visit.    Review of Systems Review of Systems  Constitutional: Negative for  fever, chills and unexpected weight change.  HENT: Positive for rhinorrhea, sneezing and sore throat. Negative for congestion, hearing loss, trouble swallowing and voice change.   Eyes: Negative for visual disturbance.  Respiratory: Negative for cough and wheezing.   Cardiovascular: Negative for chest pain, palpitations and leg swelling.  Gastrointestinal: Negative for nausea, vomiting, abdominal pain, diarrhea, constipation, blood in stool, abdominal distention, anal bleeding and rectal pain.  Genitourinary: Negative for hematuria and difficulty urinating.  Musculoskeletal: Positive for back pain. Negative for arthralgias.  Skin: Negative for rash and wound.  Neurological: Negative for seizures, syncope, weakness and headaches.  Hematological: Negative for adenopathy. Does not bruise/bleed easily.  Psychiatric/Behavioral: Negative for confusion.    Blood pressure 120/70, pulse 90, temperature 98.2 F (36.8 C), temperature source Oral, resp. rate 18, height 5\' 10"  (1.778 m), weight 188 lb (85.276 kg).  Physical Exam Physical Exam  Constitutional: He is oriented to person, place, and time. He appears well-developed and well-nourished. No distress.  HENT:  Head: Normocephalic.  Nose: Nose normal.  Mouth/Throat: No oropharyngeal exudate.  Eyes: Conjunctivae and EOM are normal. Pupils are equal, round, and reactive to light. Right eye exhibits no discharge. Left eye exhibits no discharge. No scleral icterus.  Neck: Normal range of motion. Neck supple. No JVD present. No tracheal deviation present. No thyromegaly present.  Cardiovascular: Normal rate, regular rhythm, normal heart sounds and intact distal pulses.   No murmur heard. Pulmonary/Chest: Effort  normal and breath sounds normal. No stridor. No respiratory distress. He has no wheezes. He has no rales. He exhibits no tenderness.  Abdominal: Soft. Bowel sounds are normal. He exhibits no distension and no mass. There is no tenderness.  There is no rebound and no guarding.  Genitourinary:  Left inguinal scar well healed. No hernia left groin. Small to medium size right inguinal hernia, easily palpable. Reducible. Mildly tender. Penis scrotum and testes are normal. No scrotal mass. Umbilicus normal.  Musculoskeletal: Normal range of motion. He exhibits no edema and no tenderness.  Lymphadenopathy:    He has no cervical adenopathy.  Neurological: He is alert and oriented to person, place, and time. He has normal reflexes. Coordination normal.  Skin: Skin is warm and dry. No rash noted. He is not diaphoretic. No erythema. No pallor.  Psychiatric: He has a normal mood and affect. His behavior is normal. Judgment and thought content normal.    Data Reviewed CT scan. Emergency department notes.  Assessment    Symptomatic right inguinal hernia. Possible recent episode of incarceration versus superimposed gastroenteritis. Currently stable and reducible.  Recent upper respiratory infection, resolved  Chronic lumbar pain, epidural injections periodically     Plan    At the patient's request, we will proceed with scheduling of open repair of his right inguinal hernia with mesh.  I discussed the indications, details, techniques, and numerous risk of the surgery with the patient and his wife. Temporary disability and work restrictions are discussed in detail. Patient information booklet and diagrams are reviewed. He is aware of the risk of bleeding, infection, recurrence, nerve damage with chronic pain or numbness, injury to the testicle bladder or intestine with major surgery to repair, and other unforeseen problems. He understands all these issues and all his questions are answered. He agrees with this plan.        Angelia Mould. Derrell Lolling, M.D., [facility] General and Minimally invasive Surgery Breast and Colorectal Surgery Office:   [phone] Pager:   [phone]  07/14/2013, 3:29 PM

## 2013-07-14 NOTE — Patient Instructions (Signed)
You have a reducible right inguinal hernia. This is the cause of your pain.  You have stated that you would like to go ahead and have surgery to repair this now.  You will be scheduled for open repair of your right inguinal hernia with mesh in the near future.     Inguinal Hernia, Adult Muscles help keep everything in the body in its proper place. But if a weak spot in the muscles develops, something can poke through. That is called a hernia. When this happens in the lower part of the belly (abdomen), it is called an inguinal hernia. (It takes its name from a part of the body in this region called the inguinal canal.) A weak spot in the wall of muscles lets some fat or part of the small intestine bulge through. An inguinal hernia can develop at any age. Men get them more often than women. CAUSES  In adults, an inguinal hernia develops over time.  It can be triggered by:  Suddenly straining the muscles of the lower abdomen.  Lifting heavy objects.  Straining to have a bowel movement. Difficult bowel movements (constipation) can lead to this.  Constant coughing. This may be caused by smoking or lung disease.  Being overweight.  Being pregnant.  Working at a job that requires long periods of standing or heavy lifting.  Having had an inguinal hernia before. One type can be an emergency situation. It is called a strangulated inguinal hernia. It develops if part of the small intestine slips through the weak spot and cannot get back into the abdomen. The blood supply can be cut off. If that happens, part of the intestine may die. This situation requires emergency surgery. SYMPTOMS  Often, a small inguinal hernia has no symptoms. It is found when a healthcare provider does a physical exam. Larger hernias usually have symptoms.   In adults, symptoms may include:  A lump in the groin. This is easier to see when the person is standing. It might disappear when lying down.  In men, a lump  in the scrotum.  Pain or burning in the groin. This occurs especially when lifting, straining or coughing.  A dull ache or feeling of pressure in the groin.  Signs of a strangulated hernia can include:  A bulge in the groin that becomes very painful and tender to the touch.  A bulge that turns red or purple.  Fever, nausea and vomiting.  Inability to have a bowel movement or to pass gas. DIAGNOSIS  To decide if you have an inguinal hernia, a healthcare provider will probably do a physical examination.  This will include asking questions about any symptoms you have noticed.  The healthcare provider might feel the groin area and ask you to cough. If an inguinal hernia is felt, the healthcare provider may try to slide it back into the abdomen.  Usually no other tests are needed. TREATMENT  Treatments can vary. The size of the hernia makes a difference. Options include:  Watchful waiting. This is often suggested if the hernia is small and you have had no symptoms.  No medical procedure will be done unless symptoms develop.  You will need to watch closely for symptoms. If any occur, contact your healthcare provider right away.  Surgery. This is used if the hernia is larger or you have symptoms.  Open surgery. This is usually an outpatient procedure (you will not stay overnight in a hospital). An cut (incision) is made through the skin  in the groin. The hernia is put back inside the abdomen. The weak area in the muscles is then repaired by herniorrhaphy or hernioplasty. Herniorrhaphy: in this type of surgery, the weak muscles are sewn back together. Hernioplasty: a patch or mesh is used to close the weak area in the abdominal wall.  Laparoscopy. In this procedure, a surgeon makes small incisions. A thin tube with a tiny video camera (called a laparoscope) is put into the abdomen. The surgeon repairs the hernia with mesh by looking with the video camera and using two long  instruments. HOME CARE INSTRUCTIONS   After surgery to repair an inguinal hernia:  You will need to take pain medicine prescribed by your healthcare provider. Follow all directions carefully.  You will need to take care of the wound from the incision.  Your activity will be restricted for awhile. This will probably include no heavy lifting for several weeks. You also should not do anything too active for a few weeks. When you can return to work will depend on the type of job that you have.  During "watchful waiting" periods, you should:  Maintain a healthy weight.  Eat a diet high in fiber (fruits, vegetables and whole grains).  Drink plenty of fluids to avoid constipation. This means drinking enough water and other liquids to keep your urine clear or pale yellow.  Do not lift heavy objects.  Do not stand for long periods of time.  Quit smoking. This should keep you from developing a frequent cough. SEEK MEDICAL CARE IF:   A bulge develops in your groin area.  You feel pain, a burning sensation or pressure in the groin. This might be worse if you are lifting or straining.  You develop a fever of more than 100.5 F (38.1 C). SEEK IMMEDIATE MEDICAL CARE IF:   Pain in the groin increases suddenly.  A bulge in the groin gets bigger suddenly and does not go down.  For men, there is sudden pain in the scrotum. Or, the size of the scrotum increases.  A bulge in the groin area becomes red or purple and is painful to touch.  You have nausea or vomiting that does not go away.  You feel your heart beating much faster than normal.  You cannot have a bowel movement or pass gas.  You develop a fever of more than 102.0 F (38.9 C). Document Released: 11/12/2008 Document Revised: 09/18/2011 Document Reviewed: 11/12/2008 [facility] Patient Information 2014 [employer] [place]. Inguinal Hernia, Adult  Care After Refer to this sheet in the next few weeks. These discharge instructions  provide you with general information on caring for yourself after you leave the hospital. Your caregiver may also give you specific instructions. Your treatment has been planned according to the most current medical practices available, but unavoidable complications sometimes occur. If you have any problems or questions after discharge, please call your caregiver. HOME CARE INSTRUCTIONS  Put ice on the operative site.  Put ice in a plastic bag.  Place a towel between your skin and the bag.  Leave the ice on for 15-20 minutes at a time, 03-04 times a day while awake.  Change bandages (dressings) as directed.  Keep the wound dry and clean. The wound may be washed gently with soap and water. Gently blot or dab the wound dry. It is okay to take showers 24 to 48 hours after surgery. Do not take baths, use swimming pools, or use hot tubs for 10 days, or as  directed by your caregiver.  Only take over-the-counter or prescription medicines for pain, discomfort, or fever as directed by your caregiver.  Continue your normal diet as directed.  Do not lift anything more than 10 pounds or play contact sports for 3 weeks, or as directed. SEEK MEDICAL CARE IF:  There is redness, swelling, or increasing pain in the wound.  There is fluid (pus) coming from the wound.  There is drainage from a wound lasting longer than 1 day.  You have an oral temperature above 102 F (38.9 C).  You notice a bad smell coming from the wound or dressing.  The wound breaks open after the stitches (sutures) have been removed.  You notice increasing pain in the shoulders (shoulder strap areas).  You develop dizzy episodes or fainting while standing.  You feel sick to your stomach (nauseous) or throw up (vomit). SEEK IMMEDIATE MEDICAL CARE IF:  You develop a rash.  You have difficulty breathing.  You develop a reaction or have side effects to medicines you were given. MAKE SURE YOU:   Understand these  instructions.  Will watch your condition.  Will get help right away if you are not doing well or get worse. Document Released: 07/27/2006 Document Revised: 09/18/2011 Document Reviewed: 05/26/2009 [facility] Patient Information 2014 [place], [place].

## 2013-07-15 ENCOUNTER — Encounter (HOSPITAL_COMMUNITY): Payer: Self-pay

## 2013-07-15 ENCOUNTER — Encounter (HOSPITAL_COMMUNITY): Payer: Self-pay | Admitting: *Deleted

## 2013-07-15 ENCOUNTER — Encounter (INDEPENDENT_AMBULATORY_CARE_PROVIDER_SITE_OTHER): Payer: Self-pay

## 2013-07-15 MED ORDER — CEFAZOLIN SODIUM-DEXTROSE 2-3 GM-% IV SOLR
2.0000 g | INTRAVENOUS | Status: AC
  Administered 2013-07-16: 2 g via INTRAVENOUS
  Filled 2013-07-15: qty 50

## 2013-07-15 NOTE — Progress Notes (Signed)
Patient's wife walked in today asking if they can get his postop prescription today for his surgery tomorrow.  She also asked for an order for Xanax to give the patient in the morning before surgery because he is very anxious.  I spoke to Dr Derrell LollingIngram who wrote for the Percocet 7.5/ 325 #30 1-2 po q 4hrs prn pain.  He declined the Xanax because it may interfere with anesthesia.

## 2013-07-15 NOTE — Progress Notes (Signed)
07/15/13 1700  OBSTRUCTIVE SLEEP APNEA  Have you ever been diagnosed with sleep apnea through a sleep study? No  Do you snore loudly (loud enough to be heard through closed doors)?  1  Do you often feel tired, fatigued, or sleepy during the daytime? 1  Has anyone observed you stop breathing during your sleep? 1  Do you have, or are you being treated for high blood pressure? 0  BMI more than 35 kg/m2? 0  Age over 40 years old? 0  Gender: 1  Obstructive Sleep Apnea Score 4  Score 4 or greater  Results sent to PCP

## 2013-07-16 ENCOUNTER — Encounter (HOSPITAL_COMMUNITY): Payer: 59 | Admitting: Anesthesiology

## 2013-07-16 ENCOUNTER — Ambulatory Visit (HOSPITAL_COMMUNITY)
Admission: RE | Admit: 2013-07-16 | Discharge: 2013-07-16 | Disposition: A | Discharge status: Home / Self Care | Payer: 59 | Source: Ambulatory Visit | Attending: General Surgery | Admitting: General Surgery

## 2013-07-16 ENCOUNTER — Ambulatory Visit (HOSPITAL_COMMUNITY): Payer: 59 | Admitting: Anesthesiology

## 2013-07-16 ENCOUNTER — Encounter (HOSPITAL_COMMUNITY)
Admission: RE | Disposition: A | Discharge status: Home / Self Care | Payer: Self-pay | Source: Ambulatory Visit | Attending: General Surgery

## 2013-07-16 ENCOUNTER — Encounter (HOSPITAL_COMMUNITY): Payer: Self-pay | Admitting: *Deleted

## 2013-07-16 DIAGNOSIS — Z87891 Personal history of nicotine dependence: Secondary | ICD-10-CM | POA: Insufficient documentation

## 2013-07-16 DIAGNOSIS — F988 Other specified behavioral and emotional disorders with onset usually occurring in childhood and adolescence: Secondary | ICD-10-CM | POA: Insufficient documentation

## 2013-07-16 DIAGNOSIS — K219 Gastro-esophageal reflux disease without esophagitis: Secondary | ICD-10-CM | POA: Insufficient documentation

## 2013-07-16 DIAGNOSIS — K4090 Unilateral inguinal hernia, without obstruction or gangrene, not specified as recurrent: Secondary | ICD-10-CM | POA: Diagnosis present

## 2013-07-16 DIAGNOSIS — F411 Generalized anxiety disorder: Secondary | ICD-10-CM | POA: Insufficient documentation

## 2013-07-16 HISTORY — PX: INSERTION OF MESH: SHX5868

## 2013-07-16 HISTORY — DX: Unspecified osteoarthritis, unspecified site: M19.90

## 2013-07-16 HISTORY — DX: Headache: R51

## 2013-07-16 HISTORY — PX: INGUINAL HERNIA REPAIR: SHX194

## 2013-07-16 SURGERY — REPAIR, HERNIA, INGUINAL, ADULT
Anesthesia: General | Site: Groin | Laterality: Right

## 2013-07-16 MED ORDER — ONDANSETRON HCL 4 MG/2ML IJ SOLN
INTRAMUSCULAR | Status: AC
  Filled 2013-07-16: qty 2

## 2013-07-16 MED ORDER — FENTANYL CITRATE 0.05 MG/ML IJ SOLN
INTRAMUSCULAR | Status: AC
  Filled 2013-07-16: qty 2

## 2013-07-16 MED ORDER — PROMETHAZINE HCL 25 MG/ML IJ SOLN: 6.2500 mg | INTRAMUSCULAR | Status: DC | PRN

## 2013-07-16 MED ORDER — PROPOFOL 10 MG/ML IV BOLUS
INTRAVENOUS | Status: DC | PRN
  Administered 2013-07-16: 200 mg via INTRAVENOUS

## 2013-07-16 MED ORDER — FENTANYL CITRATE 0.05 MG/ML IJ SOLN
25.0000 ug | INTRAMUSCULAR | Status: DC | PRN
  Administered 2013-07-16: 50 ug via INTRAVENOUS

## 2013-07-16 MED ORDER — MIDAZOLAM HCL 2 MG/2ML IJ SOLN
INTRAMUSCULAR | Status: AC
  Filled 2013-07-16: qty 2

## 2013-07-16 MED ORDER — MIDAZOLAM HCL 2 MG/2ML IJ SOLN
1.0000 mg | INTRAMUSCULAR | Status: DC | PRN
  Administered 2013-07-16: 2 mg via INTRAVENOUS

## 2013-07-16 MED ORDER — OXYCODONE-ACETAMINOPHEN 7.5-325 MG PO TABS: 1.0000 | ORAL_TABLET | ORAL | Status: DC | PRN

## 2013-07-16 MED ORDER — LACTATED RINGERS IV SOLN
INTRAVENOUS | Status: DC | PRN
  Administered 2013-07-16: 13:00:00 via INTRAVENOUS

## 2013-07-16 MED ORDER — HYDROMORPHONE HCL PF 1 MG/ML IJ SOLN
0.2500 mg | INTRAMUSCULAR | Status: DC | PRN
  Administered 2013-07-16 (×2): 0.5 mg via INTRAVENOUS

## 2013-07-16 MED ORDER — MIDAZOLAM HCL 5 MG/5ML IJ SOLN
INTRAMUSCULAR | Status: DC | PRN
  Administered 2013-07-16 (×2): 2 mg via INTRAVENOUS

## 2013-07-16 MED ORDER — LACTATED RINGERS IV SOLN
INTRAVENOUS | Status: DC
  Administered 2013-07-16: 12:00:00 via INTRAVENOUS

## 2013-07-16 MED ORDER — OXYCODONE HCL 5 MG PO TABS
5.0000 mg | ORAL_TABLET | Freq: Once | ORAL | Status: AC | PRN
  Administered 2013-07-16: 5 mg via ORAL

## 2013-07-16 MED ORDER — BUPIVACAINE-EPINEPHRINE (PF) 0.5% -1:200000 IJ SOLN
INTRAMUSCULAR | Status: AC
  Filled 2013-07-16: qty 10

## 2013-07-16 MED ORDER — LACTATED RINGERS IV SOLN: INTRAVENOUS | Status: DC

## 2013-07-16 MED ORDER — FENTANYL CITRATE 0.05 MG/ML IJ SOLN
INTRAMUSCULAR | Status: DC | PRN
  Administered 2013-07-16 (×2): 50 ug via INTRAVENOUS
  Administered 2013-07-16: 150 ug via INTRAVENOUS

## 2013-07-16 MED ORDER — OXYCODONE HCL 5 MG/5ML PO SOLN: 5.0000 mg | Freq: Once | ORAL | Status: AC | PRN

## 2013-07-16 MED ORDER — BUPIVACAINE-EPINEPHRINE 0.5% -1:200000 IJ SOLN
INTRAMUSCULAR | Status: DC | PRN
  Administered 2013-07-16: 20 mL

## 2013-07-16 MED ORDER — ONDANSETRON HCL 4 MG/2ML IJ SOLN
INTRAMUSCULAR | Status: DC | PRN
  Administered 2013-07-16: 4 mg via INTRAVENOUS

## 2013-07-16 MED ORDER — OXYCODONE HCL 5 MG PO TABS
ORAL_TABLET | ORAL | Status: AC
  Filled 2013-07-16: qty 1

## 2013-07-16 MED ORDER — MIDAZOLAM HCL 2 MG/2ML IJ SOLN: 0.5000 mg | Freq: Once | INTRAMUSCULAR | Status: DC | PRN

## 2013-07-16 MED ORDER — ONDANSETRON HCL 4 MG/2ML IJ SOLN
4.0000 mg | Freq: Four times a day (QID) | INTRAMUSCULAR | Status: DC | PRN
  Administered 2013-07-16: 4 mg via INTRAVENOUS

## 2013-07-16 MED ORDER — SUCCINYLCHOLINE CHLORIDE 20 MG/ML IJ SOLN
INTRAMUSCULAR | Status: DC | PRN
  Administered 2013-07-16: 100 mg via INTRAVENOUS

## 2013-07-16 MED ORDER — CHLORHEXIDINE GLUCONATE 4 % EX LIQD: 1.0000 "application " | Freq: Once | CUTANEOUS | Status: DC

## 2013-07-16 MED ORDER — LIDOCAINE HCL (CARDIAC) 20 MG/ML IV SOLN
INTRAVENOUS | Status: DC | PRN
  Administered 2013-07-16: 30 mg via INTRAVENOUS

## 2013-07-16 MED ORDER — HYDROMORPHONE HCL PF 1 MG/ML IJ SOLN
INTRAMUSCULAR | Status: AC
  Filled 2013-07-16: qty 1

## 2013-07-16 MED ORDER — MEPERIDINE HCL 25 MG/ML IJ SOLN: 6.2500 mg | INTRAMUSCULAR | Status: DC | PRN

## 2013-07-16 MED ORDER — 0.9 % SODIUM CHLORIDE (POUR BTL) OPTIME
TOPICAL | Status: DC | PRN
  Administered 2013-07-16: 1000 mL

## 2013-07-16 SURGICAL SUPPLY — 50 items
ADH SKN CLS APL DERMABOND .7 (GAUZE/BANDAGES/DRESSINGS) ×2
BLADE SURG 10 STRL SS (BLADE) ×4 IMPLANT
BLADE SURG 15 STRL LF DISP TIS (BLADE) ×2 IMPLANT
BLADE SURG 15 STRL SS (BLADE) ×4
BLADE SURG ROTATE 9660 (MISCELLANEOUS) IMPLANT
CANISTER SUCTION 2500CC (MISCELLANEOUS) IMPLANT
CHLORAPREP W/TINT 26ML (MISCELLANEOUS) ×4 IMPLANT
COVER SURGICAL LIGHT HANDLE (MISCELLANEOUS) ×4 IMPLANT
DERMABOND ADVANCED (GAUZE/BANDAGES/DRESSINGS) ×2
DERMABOND ADVANCED .7 DNX12 (GAUZE/BANDAGES/DRESSINGS) ×2 IMPLANT
DRAIN PENROSE 1/2X12 LTX STRL (WOUND CARE) IMPLANT
DRAPE LAPAROTOMY TRNSV 102X78 (DRAPE) ×4 IMPLANT
DRAPE UTILITY 15X26 W/TAPE STR (DRAPE) ×8 IMPLANT
ELECT CAUTERY BLADE 6.4 (BLADE) ×4 IMPLANT
ELECT REM PT RETURN 9FT ADLT (ELECTROSURGICAL) ×4
ELECTRODE REM PT RTRN 9FT ADLT (ELECTROSURGICAL) ×2 IMPLANT
GLOVE BIO SURGEON STRL SZ7.5 (GLOVE) ×2 IMPLANT
GLOVE BIOGEL PI IND STRL 7.5 (GLOVE) IMPLANT
GLOVE BIOGEL PI INDICATOR 7.5 (GLOVE) ×2
GLOVE EUDERMIC 7 POWDERFREE (GLOVE) ×4 IMPLANT
GOWN STRL NON-REIN LRG LVL3 (GOWN DISPOSABLE) ×4 IMPLANT
GOWN STRL REIN XL XLG (GOWN DISPOSABLE) ×4 IMPLANT
KIT BASIN OR (CUSTOM PROCEDURE TRAY) ×4 IMPLANT
KIT ROOM TURNOVER OR (KITS) ×4 IMPLANT
MESH ULTRAPRO 3X6 7.6X15CM (Mesh General) ×2 IMPLANT
NDL HYPO 25GX1X1/2 BEV (NEEDLE) ×2 IMPLANT
NEEDLE HYPO 25GX1X1/2 BEV (NEEDLE) ×4 IMPLANT
NS IRRIG 1000ML POUR BTL (IV SOLUTION) ×4 IMPLANT
PACK SURGICAL SETUP 50X90 (CUSTOM PROCEDURE TRAY) ×4 IMPLANT
PAD ARMBOARD 7.5X6 YLW CONV (MISCELLANEOUS) ×4 IMPLANT
PENCIL BUTTON HOLSTER BLD 10FT (ELECTRODE) ×4 IMPLANT
SPONGE INTESTINAL PEANUT (DISPOSABLE) IMPLANT
SPONGE LAP 18X18 X RAY DECT (DISPOSABLE) ×4 IMPLANT
SUT MNCRL AB 4-0 PS2 18 (SUTURE) ×4 IMPLANT
SUT PROLENE 2 0 CT2 30 (SUTURE) ×8 IMPLANT
SUT SILK 2 0 (SUTURE) ×4
SUT SILK 2 0 SH (SUTURE) IMPLANT
SUT SILK 2-0 18XBRD TIE 12 (SUTURE) ×2 IMPLANT
SUT VIC AB 2-0 CT1 27 (SUTURE) ×4
SUT VIC AB 2-0 CT1 TAPERPNT 27 (SUTURE) ×2 IMPLANT
SUT VIC AB 3-0 SH 27 (SUTURE) ×4
SUT VIC AB 3-0 SH 27XBRD (SUTURE) ×2 IMPLANT
SUT VICRYL AB 2 0 TIES (SUTURE) ×4 IMPLANT
SYR BULB 3OZ (MISCELLANEOUS) ×4 IMPLANT
SYR CONTROL 10ML LL (SYRINGE) ×4 IMPLANT
TOWEL OR 17X24 6PK STRL BLUE (TOWEL DISPOSABLE) ×4 IMPLANT
TOWEL OR 17X26 10 PK STRL BLUE (TOWEL DISPOSABLE) ×4 IMPLANT
TUBE CONNECTING 12'X1/4 (SUCTIONS)
TUBE CONNECTING 12X1/4 (SUCTIONS) IMPLANT
YANKAUER SUCT BULB TIP NO VENT (SUCTIONS) IMPLANT

## 2013-07-16 NOTE — Op Note (Signed)
Patient Name:           Ollen GrossWilliam C Caponigro   Date of Surgery:        07/16/2013  Pre op Diagnosis:      Right inguinal hernia  Post op Diagnosis:    Right inguinal hernia  Procedure:                 Open repair right inguinal hernia with mesh Armanda Heritage(Lichtenstein repair)  Surgeon:                     Angelia MouldHaywood M. Derrell LollingIngram, M.D., FACS  Assistant:                      none  Operative Indications:   Ollen GrossWilliam C Sudbury is a 38 y.o. male. He is referred by Dr. Linwood DibblesJon Knapp in the emergency department for evaluation of a symptomatic right inguinal hernia. He does not have a PCP. He is followed by Dr. Marikay Alaravid Jones for his chronic lumbar back problems.  He has a history of left inguinal hernia repair, probably with mesh, in [place] 15 years ago.  No problems on the left. He now gives a two-week history of a painful bulge in his right groin. With this initially happened it was very painful. He developed nausea vomiting and diarrhea that day as well. He went to the emergency department. A right inguinal hernia was diagnosed on physical exam. A CT scan showed mild infiltration of fat in the lateral right pelvis uncertain etiology. A very small hernia was theorized as one possibility.  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 Normal appetite. Normal bowel and bladder habits.He would like to have this repaired . Examination reveals a small to medium-sized right inguinal hernia, completely reducible, slightly tender. No scrotal mass. No hernia on the left.    Operative Findings:       He had an indirect right inguinal hernia as well as a lipoma associated with the cord.  Procedure in Detail:          Following the induction of general endotracheal anesthesia the patient's right groin and genitalia were prepped and draped in a sterile fashion. Surgical time out was performed. Intravenous antibiotics were given. 0.5% Marcaine was used as local infiltration anesthetic. A transverse  incision was made in the right groin, overlying the inguinal canal. Dissection was carried down through the subcutaneous tissue, exposing the aponeurosis of the external oblique.  The external oblique was incised in the direction of its fibers, opening of the external inguinal ring. The external oblique was dissected away from the underlying structures and self-retaining retractors were placed. The ilioinguinal nerve was identified and isolated and dissected back laterally to its point of emergence from the muscles. At this point it was clamped divided and ligated with 2-0 silk tie. The redundant nerve  medially was resected. The cord structures were mobilized and encircled with a Penrose drain. Cremasteric muscle fibers were skeletonized. A large lipoma associated with the cord was dissected away back to the level of the internal ring. It was skeletonized, clamped, amputated and tied off with a 2-0 Vicryl tie. The indirect hernia sac was dissected away from the cord structures all the way back to the level of the internal ring, was opened and inspected. I inserted my finger through the sac into the peritoneal space. There was no femoral hernia. The indirect sac was twisted  and suture-ligated at the level of the internal ring with a suture ligature of 2-0 silk. The redundant sac was amputated and discarded. The internal ring was tightened laterally with a figure-of-eight suture of 2-0 Vicryl. The hernia was repaired and the inguinal floor was reinforced with an onlay graft of ultra Pro mesh. A 3" x 6" piece of micromesh was brought to the operative field and trimmed at the corners to accommodate the anatomy. The mesh was sutured with running sutures of 2-0 Prolene and  interrupted mattress sutures of 2-0 Prolene. sutures were placed so as to generously overlap the fascia of the pubic tubercle, then along the inguinal ligament inferiorly. Medially, superiorly, and superolaterally interupted mattress sutures of 2-0  Prolene were placed. The mesh was incised laterally so as to wrap around the cord structures at the internal ring. The tails of the mesh were overlapped laterally and a few more sutures were placed laterally. This provided very secure repair both medial and lateral to the internal ring and  allowed an adequate fingertip opening for the cord structures. The wound was irrigated with saline. Hemostasis was excellent. The external oblique was closed with a running suture of 2-0 Vicryl, placing the  cord structures deep to the external oblique. Scarpa's fascia was closed with 3-0 Vicryl sutures and the skin closed with a running subcuticular suture of 4-0 Monocryl and Dermabond. The patient tolerated the procedure well and was taken to PACU in stable condition. EBL 10 cc peak counts correct. Complications none.     Angelia Mould. Derrell Lolling, M.D., FACS General and Minimally Invasive Surgery Breast and Colorectal Surgery  07/16/2013 2:36 PM

## 2013-07-16 NOTE — Interval H&P Note (Signed)
History and Physical Interval Note:  07/16/2013 1:01 PM  Anthony GarbeWilliam C Harris  has presented today for surgery, with the diagnosis of RIGHT INGUINAL HERNIA  The goals and the various methods of treatment have been discussed with the patient and family. After consideration of risks, benefits and other options for treatment, the patient has consented to  Procedure(s): HERNIA REPAIR INGUINAL ADULT (Right) INSERTION OF MESH (Right) as a surgical intervention .  The patient's history has been reviewed, patient examined today, no change in status, stable for surgery.  I have reviewed the patient's chart and labs.  Questions were answered to the patient's satisfaction.     [name],Aengus Sauceda M

## 2013-07-16 NOTE — Discharge Instructions (Signed)
No sports or lifting of more than 20 pounds for 5 weeks. At that time he may resume normal activities  Take a walk every day  Take a laxative or stool softener every day to prevent constipation  You may shower, starting tomorrow    [facility] _______[facility]  UMBILICAL OR INGUINAL HERNIA REPAIR: POST OP INSTRUCTIONS  Always review your discharge instruction sheet given to you by the facility where your surgery was performed. IF YOU HAVE DISABILITY OR FAMILY LEAVE FORMS, YOU MUST BRING THEM TO THE OFFICE FOR PROCESSING.   DO NOT GIVE THEM TO YOUR DOCTOR.  1. A  prescription for pain medication may be given to you upon discharge.  Take your pain medication as prescribed, if needed.  If narcotic pain medicine is not needed, then you may take acetaminophen (Tylenol) or ibuprofen (Advil) as needed. 2. Take your usually prescribed medications unless otherwise directed. 3. If you need a refill on your pain medication, please contact your pharmacy.  They will contact our office to request authorization. Prescriptions will not be filled after 5 pm or on week-ends. 4. You should follow a light diet the first 24 hours after arrival home, such as soup and crackers, etc.  Be sure to include lots of fluids daily.  Resume your normal diet the day after surgery. 5. Most patients will experience some swelling and bruising around the umbilicus or in the groin and scrotum.  Ice packs and reclining will help.  Swelling and bruising can take several days to resolve.  6. It is common to experience some constipation if taking pain medication after surgery.  Increasing fluid intake and taking a stool softener (such as Colace) will usually help or prevent this problem from occurring.  A mild laxative (Milk of Magnesia or Miralax) should be taken according to package directions if there are no bowel movements after 48 hours. 7. Unless discharge instructions indicate otherwise, you may remove your bandages  24-48 hours after surgery, and you may shower at that time.  You may have steri-strips (small skin tapes) in place directly over the incision.  These strips should be left on the skin for 7-10 days.  If your surgeon used skin glue on the incision, you may shower in 24 hours.  The glue will flake off over the next 2-3 weeks.  Any sutures or staples will be removed at the office during your follow-up visit. 8. ACTIVITIES:  You may resume regular (light) daily activities beginning the next day--such as daily self-care, walking, climbing stairs--gradually increasing activities as tolerated.  You may have sexual intercourse when it is comfortable.  Refrain from any heavy lifting or straining until approved by your doctor. a. You may drive when you are no longer taking prescription pain medication, you can comfortably wear a seatbelt, and you can safely maneuver your car and apply brakes. b. RETURN TO WORK:  __________________________________________________________ 9. You should see your doctor in the office for a follow-up appointment approximately 2-3 weeks after your surgery.  Make sure that you call for this appointment within a day or two after you arrive home to insure a convenient appointment time. 10. OTHER INSTRUCTIONS:  __________________________________________________________________________________________________________________________________________________________________________________________  WHEN TO CALL YOUR DOCTOR: 1. Fever over 101.0 2. Inability to urinate 3. Nausea and/or vomiting 4. Extreme swelling or bruising 5. Continued bleeding from incision. 6. Increased pain, redness, or drainage from the incision  The clinic staff is available to answer your questions during regular business hours.  Please dont  hesitate to call and ask to speak to one of the nurses for clinical concerns.  If you have a medical emergency, go to the nearest emergency room or call 911.  A surgeon from  [facility] is always on call at the hospital   [address], [address], [place], [place]  [address] ?  [address] [address], [place], [place]   [address] [phone] ? [phone] ? FAX [phone]) [phone] Web site: [email or web address]

## 2013-07-16 NOTE — Preoperative (Signed)
Beta Blockers   Reason not to administer Beta Blockers:Not Applicable 

## 2013-07-16 NOTE — Anesthesia Postprocedure Evaluation (Signed)
  Anesthesia Post-op Note  Patient: Anthony Harris  Procedure(s) Performed: Procedure(s): HERNIA REPAIR INGUINAL ADULT (Right) INSERTION OF MESH (Right)  Patient Location: PACU  Anesthesia Type:General  Level of Consciousness: awake, alert , oriented and patient cooperative  Airway and Oxygen Therapy: Patient Spontanous Breathing  Post-op Pain: mild  Post-op Assessment: Post-op Vital signs reviewed, Patient's Cardiovascular Status Stable, Respiratory Function Stable, Patent Airway, No signs of Nausea or vomiting and Pain level controlled  Post-op Vital Signs: Reviewed and stable  Complications: No apparent anesthesia complications

## 2013-07-16 NOTE — Transfer of Care (Signed)
Immediate Anesthesia Transfer of Care Note  Patient: Anthony Harris  Procedure(s) Performed: Procedure(s): HERNIA REPAIR INGUINAL ADULT (Right) INSERTION OF MESH (Right)  Patient Location: PACU  Anesthesia Type:General  Level of Consciousness: awake, alert  and oriented  Airway & Oxygen Therapy: Patient Spontanous Breathing and Patient connected to nasal cannula oxygen  Post-op Assessment: Report given to PACU RN and Post -op Vital signs reviewed and stable  Post vital signs: Reviewed and stable  Complications: No apparent anesthesia complications

## 2013-07-16 NOTE — Anesthesia Preprocedure Evaluation (Addendum)
Anesthesia Evaluation  Patient identified by MRN, date of birth, ID band Patient awake    Reviewed: Allergy & Precautions, H&P , NPO status , Patient's Chart, lab work & pertinent test results  History of Anesthesia Complications Negative for: history of anesthetic complications  Airway Mallampati: II TM Distance: >3 FB Neck ROM: Full    Dental  (+) Chipped and Dental Advisory Given,    Pulmonary former smoker,  breath sounds clear to auscultation  Pulmonary exam normal       Cardiovascular negative cardio ROS  Rhythm:Regular Rate:Normal     Neuro/Psych  Headaches, Anxiety Back pain: narcotics    GI/Hepatic Neg liver ROS, GERD-  Poorly Controlled,  Endo/Other  negative endocrine ROS  Renal/GU negative Renal ROS     Musculoskeletal   Abdominal   Peds  (+) ATTENTION DEFICIT DISORDER WITHOUT HYPERACTIVITY Hematology negative hematology ROS (+)   Anesthesia Other Findings   Reproductive/Obstetrics                        Anesthesia Physical Anesthesia Plan  ASA: II  Anesthesia Plan: General   Post-op Pain Management:    Induction: Intravenous  Airway Management Planned: Oral ETT  Additional Equipment:   Intra-op Plan:   Post-operative Plan: Extubation in OR  Informed Consent: I have reviewed the patients History and Physical, chart, labs and discussed the procedure including the risks, benefits and alternatives for the proposed anesthesia with the patient or authorized representative who has indicated his/her understanding and acceptance.   Dental advisory given  Plan Discussed with: Surgeon and CRNA  Anesthesia Plan Comments: (Plan routine monitors, GETA )        Anesthesia Quick Evaluation

## 2013-07-16 NOTE — H&P (Signed)
Anthony Harris   MRN:  [id number]   Description: 38 year old male  Provider: Ernestene MentionHaywood M Puneet Selden, MD  Department: [facility]          Diagnoses      Right inguinal hernia    -  Primary      550.90                Current Vitals - Last Recorded      BP Pulse Temp(Src) Resp Ht Wt      120/70 90 98.2 F (36.8 C) (Oral) 18 5\' 10"  (1.778 m) 188 lb (85.276 kg)            BMI 26.98 kg/m2                     History and Physical     Ernestene MentionHaywood M Rishi Vicario, MD       Status: Signed            Patient ID: Anthony Harris, male   DOB: 05-Apr-1976, 38 y.o.   MRN: [id number]          HPI Anthony Harris is a 38 y.o. male.  He is referred by Dr. Linwood DibblesJon Knapp in the emergency department for evaluation of a symptomatic right inguinal hernia. He does not have a PCP. He is followed by Dr. Marikay Alaravid Jones for his chronic lumbar back problems.   He has a history of left inguinal hernia repair, probably with mesh, in [place] 15 years ago. He does not recall his Careers advisersurgeon. No problems on the left. He now gives a two-week history of a painful bulge in his right groin. With this initially happened it was very painful. He developed nausea vomiting and diarrhea that day as well. He went to the emergency department. A right inguinal hernia was diagnosed on physical exam. A CT scan showed mild infiltration of fat in the lateral right pelvis uncertain etiology. A very small hernia was theorized is one possibility.   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 Normal appetite. Normal bowel and bladder habits.He would like to have this repaired   Comorbidities reveal attention deficit disorder. Lumbar disc problems, he has received a few epidural steroid injections. Recent upper respiratory infection risk, resolving         Past Medical History   Diagnosis  Date   .  Anxiety     .  Attention deficit disorder     .  Lumbar disc herniation     .  GERD  (gastroesophageal reflux disease)           Past Surgical History   Procedure  Laterality  Date   .  Hernia repair            No family history on file.   Social History History   Substance Use Topics   .  Smoking status:  Former Games developermoker   .  Smokeless tobacco:  Not on file   .  Alcohol Use:  Yes         Comment: Social drinking        No Known Allergies    No current outpatient prescriptions on file.       No current facility-administered medications for this visit.        Review of Systems Review of Systems  Constitutional: Negative for fever, chills and unexpected weight change.  HENT:  Positive for rhinorrhea, sneezing and sore throat. Negative for congestion, hearing loss, trouble swallowing and voice change.   Eyes: Negative for visual disturbance.  Respiratory: Negative for cough and wheezing.   Cardiovascular: Negative for chest pain, palpitations and leg swelling.  Gastrointestinal: Negative for nausea, vomiting, abdominal pain, diarrhea, constipation, blood in stool, abdominal distention, anal bleeding and rectal pain.  Genitourinary: Negative for hematuria and difficulty urinating.  Musculoskeletal: Positive for back pain. Negative for arthralgias.  Skin: Negative for rash and wound.  Neurological: Negative for seizures, syncope, weakness and headaches.  Hematological: Negative for adenopathy. Does not bruise/bleed easily.  Psychiatric/Behavioral: Negative for confusion.      Blood pressure 120/70, pulse 90, temperature 98.2 F (36.8 C), temperature source Oral, resp. rate 18, height 5\' 10"  (1.778 m), weight 188 lb (85.276 kg).   Physical Exam Physical Exam  Constitutional: He is oriented to person, place, and time. He appears well-developed and well-nourished. No distress.  HENT:   Head: Normocephalic.   Nose: Nose normal.   Mouth/Throat: No oropharyngeal exudate.  Eyes: Conjunctivae and EOM are normal. Pupils are equal, round, and  reactive to light. Right eye exhibits no discharge. Left eye exhibits no discharge. No scleral icterus.  Neck: Normal range of motion. Neck supple. No JVD present. No tracheal deviation present. No thyromegaly present.  Cardiovascular: Normal rate, regular rhythm, normal heart sounds and intact distal pulses.    No murmur heard. Pulmonary/Chest: Effort normal and breath sounds normal. No stridor. No respiratory distress. He has no wheezes. He has no rales. He exhibits no tenderness.  Abdominal: Soft. Bowel sounds are normal. He exhibits no distension and no mass. There is no tenderness. There is no rebound and no guarding.  Genitourinary:  Left inguinal scar well healed. No hernia left groin. Small to medium size right inguinal hernia, easily palpable. Reducible. Mildly tender. Penis scrotum and testes are normal. No scrotal mass. Umbilicus normal.  Musculoskeletal: Normal range of motion. He exhibits no edema and no tenderness.  Lymphadenopathy:    He has no cervical adenopathy.  Neurological: He is alert and oriented to person, place, and time. He has normal reflexes. Coordination normal.  Skin: Skin is warm and dry. No rash noted. He is not diaphoretic. No erythema. No pallor.  Psychiatric: He has a normal mood and affect. His behavior is normal. Judgment and thought content normal.      Data Reviewed CT scan. Emergency department notes.   Assessment    Symptomatic right inguinal hernia. Possible recent episode of incarceration versus superimposed gastroenteritis. Currently stable and reducible.   Recent upper respiratory infection, resolved   Chronic lumbar pain, epidural injections periodically      Plan    At the patient's request, we will proceed with scheduling of open repair of his right inguinal hernia with mesh.   I discussed the indications, details, techniques, and numerous risk of the surgery with the patient and his wife. Temporary disability and work  restrictions are discussed in detail. Patient information booklet and diagrams are reviewed. He is aware of the risk of bleeding, infection, recurrence, nerve damage with chronic pain or numbness, injury to the testicle bladder or intestine with major surgery to repair, and other unforeseen problems. He understands all these issues and all his questions are answered. He agrees with this plan.           Angelia Mould. Derrell Lolling, M.D., [facility] General and Minimally invasive Surgery Breast and Colorectal Surgery Office:  [phone] Pager:   [phone]

## 2013-07-18 ENCOUNTER — Telehealth (INDEPENDENT_AMBULATORY_CARE_PROVIDER_SITE_OTHER): Payer: Self-pay

## 2013-07-18 NOTE — Telephone Encounter (Signed)
Called to notify her that the rx for Percocet 7.5mg /325 is ready at the front desk for p/u.

## 2013-07-18 NOTE — Telephone Encounter (Signed)
Message copied by Ivory BroadGLASPEY, Audwin Semper on Fri Jul 18, 2013  9:32 AM ------      Message from: Louie CasaBARAJAS, RUTH      Created: Tue Jul 15, 2013  1:31 PM      Regarding: Dr. Rosie FateIngram/1st po appt      Contact: [phone]       Patient is going to have hernia repair on 07/16/13 with Dr. Derrell LollingIngram and needs schedule 2-3 weeks post op appointment, please could you make the appointment.            Thank you. ------

## 2013-07-18 NOTE — Telephone Encounter (Signed)
Please ask one of the physicians in the office this afternoon to ride a refill prescription for this patient.  hmi

## 2013-07-18 NOTE — Telephone Encounter (Signed)
I called and checked on the pt.  I spoke to his wife.  He is resting, doing ok.  She states he needs a refill on his pain medicine.  I will let Dr Derrell LollingIngram know.  I also gave him a po appointment for 08/08/2013 at 2pm.  We will call back when the RX is ready

## 2013-07-18 NOTE — Telephone Encounter (Signed)
Wife is asking if her husbands Rx can be ready for P/U ,she is going out around 230 to p/u her daughter. I advised her DR. Derrell Lollingngram will not be in until 3:30 so it will most likely be around that time for we have an answer. She verbalized understanding

## 2013-07-21 ENCOUNTER — Ambulatory Visit (INDEPENDENT_AMBULATORY_CARE_PROVIDER_SITE_OTHER): Payer: 59 | Admitting: Surgery

## 2013-07-21 ENCOUNTER — Encounter (HOSPITAL_COMMUNITY): Payer: Self-pay | Admitting: General Surgery

## 2013-07-22 ENCOUNTER — Telehealth (INDEPENDENT_AMBULATORY_CARE_PROVIDER_SITE_OTHER): Payer: Self-pay | Admitting: General Surgery

## 2013-07-22 DIAGNOSIS — G8918 Other acute postprocedural pain: Secondary | ICD-10-CM

## 2013-07-22 MED ORDER — HYDROCODONE-ACETAMINOPHEN 5-325 MG PO TABS: 1.0000 | ORAL_TABLET | Freq: Four times a day (QID) | ORAL | Status: DC | PRN

## 2013-07-22 NOTE — Addendum Note (Signed)
Addended by: Brennan BaileyBROOKS, Velena Keegan on: 07/22/2013 02:57 PM   Modules accepted: Orders

## 2013-07-22 NOTE — Telephone Encounter (Signed)
Script up front for pick up. Pt wife aware

## 2013-07-22 NOTE — Telephone Encounter (Signed)
Patient wife called and wanted Rx for pain for her husband. Patient had surgery back 07-16-13 for hernia. Call back # [phone] this is his cell number.

## 2013-07-28 ENCOUNTER — Telehealth (INDEPENDENT_AMBULATORY_CARE_PROVIDER_SITE_OTHER): Payer: Self-pay

## 2013-07-28 DIAGNOSIS — G8918 Other acute postprocedural pain: Secondary | ICD-10-CM

## 2013-07-28 NOTE — Telephone Encounter (Signed)
The patient's wife called and reports the patient has a spot of blood that comes from his incision periodically.  They wipe it away and it is fine.  There is no pus. He has no fever. There is no redness.  His incision is no longer swollen but they feel a knot across the whole incision.  I explained about hernia repair and how there can sometimes be a seroma that forms and the body can reabsorb the fluid.  I told her the little spots of blood are not too alarming. I advised the knot they feel across the incision is probably the dissolvable sutures under the skin.  They will eventually soften up.  He is still uncomfortable.  He is still taking some Norco.  He wants a refill if possible.  He is trying to wean off the medicine.    The wife would also like to know about returning to work.  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  He know he can't lift right now but can he go to work and be on his feet all day without lifting or should he do half days.  He has a postop appointment on 1/30.

## 2013-07-28 NOTE — Telephone Encounter (Signed)
No work until I see him in office.  If wound continues to bleed should be seen in urgent office This Week.  hmi

## 2013-07-29 MED ORDER — HYDROCODONE-ACETAMINOPHEN 5-325 MG PO TABS: 1.0000 | ORAL_TABLET | Freq: Four times a day (QID) | ORAL | Status: DC | PRN

## 2013-07-29 NOTE — Telephone Encounter (Signed)
I called the patient's wife and let her know Dr Jacinto HalimIngram's comments below.  I told her the knots are the sutures and are normal.  I advised we are refilling his pain medicine.  I had Dr Carolynne Edouardoth sign for Norco 5/325 1 tab po q 6 hrs prn pain #30, no refill.  She will pick it up today.   Ernestene MentionHaywood M Ingram, MD -Ivory BroadSara Adithya Difrancesco, RN            Please refill mr ezell's pain meds   hmi

## 2013-08-08 ENCOUNTER — Ambulatory Visit (INDEPENDENT_AMBULATORY_CARE_PROVIDER_SITE_OTHER): Payer: 59 | Admitting: General Surgery

## 2013-08-08 ENCOUNTER — Encounter (INDEPENDENT_AMBULATORY_CARE_PROVIDER_SITE_OTHER): Payer: Self-pay | Admitting: General Surgery

## 2013-08-08 VITALS — BP 110/78 | HR 64 | Temp 98.2°F | Resp 14 | Ht 70.0 in | Wt 188.6 lb

## 2013-08-08 DIAGNOSIS — K4090 Unilateral inguinal hernia, without obstruction or gangrene, not specified as recurrent: Secondary | ICD-10-CM

## 2013-08-09 NOTE — Progress Notes (Signed)
Patient ID: Anthony GrossWilliam C Harris, male   DOB: 1976-06-14, 38 y.o.   MRN: [id number] Patient left exam room and office prior to being seen.   [name],Aneri Slagel M

## 2013-08-11 ENCOUNTER — Encounter (INDEPENDENT_AMBULATORY_CARE_PROVIDER_SITE_OTHER): Payer: 59 | Admitting: General Surgery

## 2013-08-13 ENCOUNTER — Encounter (INDEPENDENT_AMBULATORY_CARE_PROVIDER_SITE_OTHER): Payer: Self-pay | Admitting: General Surgery

## 2014-04-26 ENCOUNTER — Emergency Department (HOSPITAL_COMMUNITY)
Admission: EM | Admit: 2014-04-26 | Discharge: 2014-04-26 | Disposition: A | Discharge status: Home / Self Care | Payer: 59 | Attending: Emergency Medicine | Admitting: Emergency Medicine

## 2014-04-26 ENCOUNTER — Encounter (HOSPITAL_COMMUNITY): Payer: Self-pay | Admitting: Emergency Medicine

## 2014-04-26 DIAGNOSIS — Z87891 Personal history of nicotine dependence: Secondary | ICD-10-CM | POA: Insufficient documentation

## 2014-04-26 DIAGNOSIS — M1990 Unspecified osteoarthritis, unspecified site: Secondary | ICD-10-CM | POA: Diagnosis not present

## 2014-04-26 DIAGNOSIS — M5442 Lumbago with sciatica, left side: Secondary | ICD-10-CM | POA: Diagnosis not present

## 2014-04-26 DIAGNOSIS — K219 Gastro-esophageal reflux disease without esophagitis: Secondary | ICD-10-CM | POA: Insufficient documentation

## 2014-04-26 DIAGNOSIS — F419 Anxiety disorder, unspecified: Secondary | ICD-10-CM | POA: Diagnosis not present

## 2014-04-26 DIAGNOSIS — M545 Low back pain: Secondary | ICD-10-CM | POA: Diagnosis present

## 2014-04-26 DIAGNOSIS — Z79899 Other long term (current) drug therapy: Secondary | ICD-10-CM | POA: Insufficient documentation

## 2014-04-26 MED ORDER — DIAZEPAM 5 MG PO TABS
5.0000 mg | ORAL_TABLET | Freq: Once | ORAL | Status: AC
  Administered 2014-04-26: 5 mg via ORAL
  Filled 2014-04-26: qty 1

## 2014-04-26 MED ORDER — METHOCARBAMOL 500 MG PO TABS: 500.0000 mg | ORAL_TABLET | Freq: Four times a day (QID) | ORAL | Status: DC | PRN

## 2014-04-26 MED ORDER — OXYCODONE-ACETAMINOPHEN 5-325 MG PO TABS: 1.0000 | ORAL_TABLET | ORAL | Status: DC | PRN

## 2014-04-26 MED ORDER — HYDROMORPHONE HCL 1 MG/ML IJ SOLN
1.0000 mg | Freq: Once | INTRAMUSCULAR | Status: AC
  Administered 2014-04-26: 1 mg via INTRAVENOUS
  Filled 2014-04-26: qty 1

## 2014-04-26 MED ORDER — KETOROLAC TROMETHAMINE 30 MG/ML IJ SOLN
30.0000 mg | Freq: Once | INTRAMUSCULAR | Status: AC
  Administered 2014-04-26: 30 mg via INTRAVENOUS
  Filled 2014-04-26: qty 1

## 2014-04-26 NOTE — ED Notes (Signed)
The pt is c/o back pain for a few months and worse since Wednesday.  When he walks he is in severe pain.  He has been taking vicodin and gabapentin with no relief..  Not sleeping at night.

## 2014-04-26 NOTE — ED Notes (Addendum)
C/o acute and chronic lower back pain with radiation down L leg, also reports numbness and tingling in L LE and foot. Onset this episode last Tuesday/wednesday. denies loss of control of bowel or bladder, fever or other sx. Pt of Dr. Providence LaniusJones NSURG. Rates pain 8/10. Last norco at 2300, last gabapentin was 1800. Restless sitting in w/c. Family with pt.

## 2014-04-26 NOTE — ED Notes (Signed)
Pain med given 

## 2014-04-26 NOTE — ED Provider Notes (Signed)
CSN: [id number]     Arrival date & time 04/26/14  0019 History   First MD Initiated Contact with Patient 04/26/14 0131     Chief Complaint  Patient presents with  . Back Pain  . Leg Pain      HPI Patient has a history of low back pain with radiation down towards his left side.  He has been seeing his neurosurgeon for this.  He's been trying Vicodin at home for his pain without improvement.  He feels as though his pain has been worsening over the past several days.  He denies fevers and chills.  Denies nausea vomiting.  Denies any weakness of his arms or legs.  Does report that the pain seems to be shooting further down the left leg.  No bowel or bladder complaints.  Pain is moderate to severe in severity at this time.  Nothing worsens or improves his pain.   Past Medical History  Diagnosis Date  . Anxiety   . Attention deficit disorder   . Lumbar disc herniation   . GERD (gastroesophageal reflux disease)   . Headache(784.0)   . Arthritis    Past Surgical History  Procedure Laterality Date  . Hernia repair    . Tonsillectomy    . Lumbar epidural injection      Hx; of for pain  . Inguinal hernia repair Right 07/16/2013    Procedure: HERNIA REPAIR INGUINAL ADULT;  Surgeon: Ernestene MentionHaywood M Ingram, MD;  [facility];  Service: General;  Laterality: Right;  . Insertion of mesh Right 07/16/2013    Procedure: INSERTION OF MESH;  Surgeon: Ernestene MentionHaywood M Ingram, MD;  [facility];  Service: General;  Laterality: Right;   Family History  Problem Relation Age of Onset  . Stroke Father   . Hypercholesterolemia Father   . Diabetes Sister   . Diabetes Brother   . Hypertension Other   . Cancer - Lung Other    History  Substance Use Topics  . Smoking status: Former Smoker    Types: Cigarettes  . Smokeless tobacco: Never Used  . Alcohol Use: Yes     Comment: Social drinking    Review of Systems  All other systems reviewed and are negative.     Allergies  Review of patient's allergies  indicates no known allergies.  Home Medications   Prior to Admission medications   Medication Sig Start Date End Date Taking? Authorizing Provider  calcium carbonate (TUMS - DOSED IN MG ELEMENTAL CALCIUM) 500 MG chewable tablet Chew 3-4 tablets by mouth daily as needed for indigestion or heartburn.   Yes Historical Provider, MD  gabapentin (NEURONTIN) 300 MG capsule Take 300 mg by mouth 3 (three) times daily.   Yes Historical Provider, MD  HYDROcodone-acetaminophen (NORCO/VICODIN) 5-325 MG per tablet Take 1-2 tablets by mouth every 4 (four) hours as needed for moderate pain.   Yes Historical Provider, MD  ranitidine (ZANTAC) 150 MG tablet Take 150 mg by mouth daily as needed for heartburn.   Yes Historical Provider, MD  triamcinolone cream (KENALOG) 0.1 % Apply 1 application topically daily as needed (itching).   Yes Historical Provider, MD  methocarbamol (ROBAXIN) 500 MG tablet Take 1 tablet (500 mg total) by mouth every 6 (six) hours as needed for muscle spasms. 04/26/14   Lyanne CoKevin M Lynniah Janoski, MD  oxyCODONE-acetaminophen (PERCOCET/ROXICET) 5-325 MG per tablet Take 1 tablet by mouth every 4 (four) hours as needed for severe pain. 04/26/14   Lyanne CoKevin M Nabor Thomann, MD  BP 120/81  Pulse 71  Temp(Src) 98.1 F (36.7 C) (Oral)  Resp 16  SpO2 94% Physical Exam  Nursing note and vitals reviewed. Constitutional: He is oriented to person, place, and time. He appears well-developed and well-nourished.  HENT:  Head: Normocephalic and atraumatic.  Eyes: EOM are normal.  Neck: Normal range of motion.  Cardiovascular: Normal rate, regular rhythm, normal heart sounds and intact distal pulses.   Pulmonary/Chest: Effort normal and breath sounds normal. No respiratory distress.  Abdominal: Soft. He exhibits no distension. There is no tenderness.  Musculoskeletal: Normal range of motion.  No point tenderness over the thoracic or lumbar spine but does have notable paralumbar spasm.  5 out of 5 strength in bilateral  lower extremity major muscle groups.  Normal PT and DP pulses bilaterally the  Neurological: He is alert and oriented to person, place, and time.  Skin: Skin is warm and dry.  Psychiatric: He has a normal mood and affect. Judgment normal.    ED Course  Procedures (including critical care time) Labs Review Labs Reviewed - No data to display  Imaging Review No results found.   EKG Interpretation None      MDM   Final diagnoses:  Low back pain with left-sided sciatica, unspecified back pain laterality    Patient feeling much better at this time.  Discharge home with PCP and neurosurgical followup.  He does not need an MRI at this time.  Nothing to suggest cauda equina.  Pain improved.  Home with a short course of Percocet.    Lyanne CoKevin M Lourdes Manning, MD 04/26/14 [phone]

## 2014-05-12 ENCOUNTER — Encounter (HOSPITAL_COMMUNITY): Payer: Self-pay | Admitting: *Deleted

## 2014-05-12 ENCOUNTER — Emergency Department (HOSPITAL_COMMUNITY): Payer: 59

## 2014-05-12 ENCOUNTER — Emergency Department (HOSPITAL_COMMUNITY)
Admission: EM | Admit: 2014-05-12 | Discharge: 2014-05-13 | Disposition: A | Discharge status: Home / Self Care | Payer: 59 | Attending: Emergency Medicine | Admitting: Emergency Medicine

## 2014-05-12 DIAGNOSIS — R200 Anesthesia of skin: Secondary | ICD-10-CM | POA: Diagnosis not present

## 2014-05-12 DIAGNOSIS — Z79899 Other long term (current) drug therapy: Secondary | ICD-10-CM | POA: Insufficient documentation

## 2014-05-12 DIAGNOSIS — R51 Headache: Secondary | ICD-10-CM | POA: Insufficient documentation

## 2014-05-12 DIAGNOSIS — M79605 Pain in left leg: Secondary | ICD-10-CM | POA: Insufficient documentation

## 2014-05-12 DIAGNOSIS — M545 Low back pain, unspecified: Secondary | ICD-10-CM

## 2014-05-12 DIAGNOSIS — M5416 Radiculopathy, lumbar region: Secondary | ICD-10-CM | POA: Insufficient documentation

## 2014-05-12 DIAGNOSIS — K219 Gastro-esophageal reflux disease without esophagitis: Secondary | ICD-10-CM | POA: Insufficient documentation

## 2014-05-12 DIAGNOSIS — M549 Dorsalgia, unspecified: Secondary | ICD-10-CM

## 2014-05-12 DIAGNOSIS — F419 Anxiety disorder, unspecified: Secondary | ICD-10-CM | POA: Diagnosis not present

## 2014-05-12 DIAGNOSIS — M1990 Unspecified osteoarthritis, unspecified site: Secondary | ICD-10-CM | POA: Insufficient documentation

## 2014-05-12 DIAGNOSIS — Z87891 Personal history of nicotine dependence: Secondary | ICD-10-CM | POA: Insufficient documentation

## 2014-05-12 MED ORDER — DIAZEPAM 5 MG/ML IJ SOLN
5.0000 mg | Freq: Once | INTRAMUSCULAR | Status: AC
  Administered 2014-05-12: 5 mg via INTRAVENOUS
  Filled 2014-05-12: qty 2

## 2014-05-12 MED ORDER — HYDROMORPHONE HCL 1 MG/ML IJ SOLN
2.0000 mg | Freq: Once | INTRAMUSCULAR | Status: AC
  Administered 2014-05-12: 2 mg via INTRAMUSCULAR
  Filled 2014-05-12: qty 2

## 2014-05-12 MED ORDER — SODIUM CHLORIDE 0.9 % IV SOLN
Freq: Once | INTRAVENOUS | Status: AC
  Administered 2014-05-12: 125 mL/h via INTRAVENOUS

## 2014-05-12 MED ORDER — GADOBENATE DIMEGLUMINE 529 MG/ML IV SOLN
19.0000 mL | Freq: Once | INTRAVENOUS | Status: AC | PRN
  Administered 2014-05-12: 19 mL via INTRAVENOUS

## 2014-05-12 NOTE — ED Notes (Signed)
Pt in c/o lower back pain that radiates down his left leg into his foot, seen here for same two weeks ago, history of disk injuries, pt denies new injury

## 2014-05-12 NOTE — ED Provider Notes (Signed)
CSN: [id number]     Arrival date & time 05/12/14  1940 History  This chart was scribed for non-physician practitioner, Elpidio AnisShari Camaya Gannett, PA-C, working with Lyanne CoKevin M Campos, MD, by Bronson CurbJacqueline Melvin, ED Scribe. This patient was seen in room TR11C/TR11C and the patient's care was started at 8:49 PM.     Chief Complaint  Patient presents with  . Back Pain    Patient is a 38 y.o. male presenting with back pain. The history is provided by the patient. No language interpreter was used.  Back Pain Location:  Lumbar spine Quality:  Shooting Radiates to:  L posterior upper leg, L thigh, L knee and L foot Pain is:  Same all the time Duration:  1 week Timing:  Constant Progression:  Worsening Chronicity:  Recurrent Relieved by:  Narcotics Worsened by:  Movement and ambulation Associated symptoms: leg pain and numbness   Associated symptoms: no bladder incontinence, no bowel incontinence, no dysuria and no fever      HPI Comments: Anthony Harris is a 38 y.o. male who presents to the Emergency Department complaining of lower back pain that has been ongoing for that past week. Patient states he was diagnosed with a pinched nerve that was seen on an MRI (2 months ago) [facility]. Patient states he received an epidural injection, 2 weeks ago, and reports the pain has become progressively worse since. He states the pain radiates down his left leg and into his left foot, with associated numbness of the left leg. Patient states he has been unable to ambulate and extend his left leg due to pain. He has tried Robaxin, Gabapentin, Percocet, and Vicodin for relief of pain, however, patient states he is concerned that his condition is worsening. He denies any bowel/bladder incontinence.     Past Medical History  Diagnosis Date  . Anxiety   . Attention deficit disorder   . Lumbar disc herniation   . GERD (gastroesophageal reflux disease)   .  Headache(784.0)   . Arthritis    Past Surgical History  Procedure Laterality Date  . Hernia repair    . Tonsillectomy    . Lumbar epidural injection      Hx; of for pain  . Inguinal hernia repair Right 07/16/2013    Procedure: HERNIA REPAIR INGUINAL ADULT;  Surgeon: Ernestene MentionHaywood M Ingram, MD;  [facility];  Service: General;  Laterality: Right;  . Insertion of mesh Right 07/16/2013    Procedure: INSERTION OF MESH;  Surgeon: Ernestene MentionHaywood M Ingram, MD;  [facility];  Service: General;  Laterality: Right;   Family History  Problem Relation Age of Onset  . Stroke Father   . Hypercholesterolemia Father   . Diabetes Sister   . Diabetes Brother   . Hypertension Other   . Cancer - Lung Other    History  Substance Use Topics  . Smoking status: Former Smoker    Types: Cigarettes  . Smokeless tobacco: Never Used  . Alcohol Use: Yes     Comment: Social drinking    Review of Systems  Constitutional: Negative for fever.  Gastrointestinal: Negative for bowel incontinence.  Genitourinary: Negative for bladder incontinence and dysuria.  Musculoskeletal: Positive for back pain.  Neurological: Positive for numbness.      Allergies  Review of patient's allergies indicates no known allergies.  Home Medications   Prior to Admission medications   Medication Sig Start Date End Date Taking? Authorizing Provider  calcium  carbonate (TUMS - DOSED IN MG ELEMENTAL CALCIUM) 500 MG chewable tablet Chew 3-4 tablets by mouth daily as needed for indigestion or heartburn.    Historical Provider, MD  gabapentin (NEURONTIN) 300 MG capsule Take 300 mg by mouth 3 (three) times daily.    Historical Provider, MD  HYDROcodone-acetaminophen (NORCO/VICODIN) 5-325 MG per tablet Take 1-2 tablets by mouth every 4 (four) hours as needed for moderate pain.    Historical Provider, MD  methocarbamol (ROBAXIN) 500 MG tablet Take 1 tablet (500 mg total) by mouth every 6 (six) hours as needed for muscle spasms. 04/26/14    Lyanne Co, MD  oxyCODONE-acetaminophen (PERCOCET/ROXICET) 5-325 MG per tablet Take 1 tablet by mouth every 4 (four) hours as needed for severe pain. 04/26/14   Lyanne Co, MD  ranitidine (ZANTAC) 150 MG tablet Take 150 mg by mouth daily as needed for heartburn.    Historical Provider, MD  triamcinolone cream (KENALOG) 0.1 % Apply 1 application topically daily as needed (itching).    Historical Provider, MD   Triage Vitals: BP 118/90 mmHg  Pulse 109  Temp(Src) 98 F (36.7 C) (Oral)  Resp 16  Ht 5\' 10"  (1.778 m)  Wt 190 lb (86.183 kg)  BMI 27.26 kg/m2  SpO2 98%  Physical Exam  Constitutional: He is oriented to person, place, and time. He appears well-developed and well-nourished. No distress.  HENT:  Head: Normocephalic and atraumatic.  Eyes: Conjunctivae and EOM are normal.  Neck: Neck supple. No tracheal deviation present.  Cardiovascular: Normal rate.   Pulmonary/Chest: Effort normal. No respiratory distress.  Abdominal: There is no tenderness.  Musculoskeletal: Normal range of motion.  No swelling left LE. He has full motion, greater pain with full extension. LE held in flexion. Normal strength.  Neurological: He is alert and oriented to person, place, and time.  Straight leg raise positive on left. Patient cannot tolerate reflexes of left LE. Normal sensation, excessive/significant skin sensitivity to light touch left LE.   Skin: Skin is warm and dry.  Psychiatric: He has a normal mood and affect. His behavior is normal.  Nursing note and vitals reviewed.   ED Course  Procedures (including critical care time)  DIAGNOSTIC STUDIES: Oxygen Saturation is 98% on room air, normal by my interpretation.    COORDINATION OF CARE: At 2100 Discussed treatment plan with patient which includes pain medication. Patient agrees.    Labs Review Labs Reviewed - No data to display  Imaging Review Mr Lumbar Spine W Wo Contrast  05/12/2014   CLINICAL DATA:  Initial evaluation  for low back pain with radiation into left leg and foot.  EXAM: MRI LUMBAR SPINE WITHOUT AND WITH CONTRAST  TECHNIQUE: Multiplanar and multiecho pulse sequences of the lumbar spine were obtained without and with intravenous contrast.  CONTRAST:  19mL MULTIHANCE GADOBENATE DIMEGLUMINE 529 MG/ML IV SOLN  COMPARISON:  Prior MRI from 06/27/2012  FINDINGS: For the purposes of this dictation, the lowest well-formed intervertebral disc spaces presumed to be the L5-S1 level, and there presumed to be 5 lumbar type vertebral bodies.  Vertebral bodies are normally aligned with preservation of the normal lumbar lordosis. Vertebral body heights are preserved. No acute fracture.  Signal intensity within the vertebral body bone marrow is normal. No focal osseous lesion. No evidence for osteomyelitis discitis.  Conus medullaris terminates normally at the L1 level. Signal intensity within the visualized cord is normal. Nerve roots of the cauda equina within normal limits. No epidural abscess.  Paraspinous soft  tissues within normal limits. No abnormal enhancement identified within the lumbar spine.  Diffuse congenital shortening of the pedicles is present.  T12-L1:  Negative.  L1-2:  Negative.  L2-3: Minimal disc desiccation with disc bulge. No focal disc protrusion. Mild facet hypertrophy bilaterally, greater on the left. No significant canal or foraminal stenosis.  L3-4: Minimal disc bulge present. Mild bilateral facet hypertrophy. No significant canal or foraminal stenosis.  L4-5: Diffuse disc bulge with mild disc desiccation. A small superimposed central disc protrusion indents the ventral thecal sac. There is mild bilateral facet hypertrophy. Mild bilateral lateral recess stenosis and mild canal stenosis present. There is mild bilateral foraminal narrowing. The bulging disc abuts the transiting left L4 nerve root as it courses out of the left neural foramen, which could potentially result in nerve root irritation (series 8,  image 14). This is similar to previous exam.  L5-S1: Mild diffuse disc bulge with disc desiccation. Superimposed right foraminal disc protrusion is decreased in size relative to prior exam, and is nearly completely resorbed. Mild bilateral facet arthrosis. No significant canal or foraminal stenosis.  IMPRESSION: 1. Mild diffuse disc bulge and bilateral facet hypertrophy at L4-5 with resultant mild canal and bilateral lateral recess stenosis. The bulging disc is slightly eccentric to the left, and abuts the exiting left L4 nerve root as it courses out of the left neural foramen. The left L4 nerve root itself is slightly enlarged and edematous in appearance, suggesting inflammation. 2. Decreased size of right foraminal disc protrusion at L5-S1 as compared to 06/27/12. No significant canal or foraminal stenosis or neural impingement now seen at this level. 3. No abnormal enhancement or evidence of infection identified within the lumbar spine. 4. Diffuse congenital shortening of the pedicles.   Electronically Signed   By: Rise MuBenjamin  McClintock M.D.   On: 05/12/2014 23:34     EKG Interpretation None      MDM   Final diagnoses:  None    1. Lumbar radiculopathy  Pain is improved/controlled in ED. Concern for possible abscess from recent epidural injection given pain out of proportion to exam. No neurologic deficits. MR w/ and w/o CM shows mild bulging disc, no abscess or infection. He has medications at home for pain that he can continue until follow up with Dr. Yetta BarreJones as planned.   I personally performed the services described in this documentation, which was scribed in my presence. The recorded information has been reviewed and is accurate.     Arnoldo HookerShari A Michaelia Beilfuss, PA-C 05/13/14 [phone]

## 2014-05-12 NOTE — ED Notes (Signed)
Pt c/o pain in lower back radiating down left leg.  St's he had epidural shot in back last week and now pain is worse.

## 2014-05-13 MED ORDER — HYDROMORPHONE HCL 1 MG/ML IJ SOLN
1.0000 mg | Freq: Once | INTRAMUSCULAR | Status: AC
Start: 2014-05-13 — End: 2014-05-13
  Administered 2014-05-13: 1 mg via INTRAVENOUS
  Filled 2014-05-13: qty 1

## 2014-05-13 NOTE — Discharge Instructions (Signed)
FOLLOW UP WITH DR. Yetta BarreJONES PER SCHEDULED APPOINTMENTS. RETURN HERE AS NEEDED.

## 2014-05-27 ENCOUNTER — Other Ambulatory Visit: Payer: Self-pay | Admitting: Neurological Surgery

## 2014-06-17 ENCOUNTER — Other Ambulatory Visit (HOSPITAL_COMMUNITY): Payer: 59

## 2014-06-25 ENCOUNTER — Ambulatory Visit (HOSPITAL_COMMUNITY): Admission: RE | Admit: 2014-06-25 | Payer: 59 | Source: Ambulatory Visit | Admitting: Neurological Surgery

## 2014-06-25 ENCOUNTER — Encounter (HOSPITAL_COMMUNITY): Admission: RE | Payer: Self-pay | Source: Ambulatory Visit

## 2014-06-25 SURGERY — LUMBAR LAMINECTOMY/DECOMPRESSION MICRODISCECTOMY 1 LEVEL
Anesthesia: General | Site: Back | Laterality: Left

## 2018-01-28 ENCOUNTER — Encounter (HOSPITAL_COMMUNITY): Payer: Self-pay | Admitting: Emergency Medicine

## 2018-01-28 ENCOUNTER — Ambulatory Visit (HOSPITAL_COMMUNITY)
Admission: EM | Admit: 2018-01-28 | Discharge: 2018-01-28 | Disposition: A | Discharge status: Home / Self Care | Payer: 59 | Attending: Family Medicine | Admitting: Family Medicine

## 2018-01-28 DIAGNOSIS — N5089 Other specified disorders of the male genital organs: Secondary | ICD-10-CM | POA: Diagnosis not present

## 2018-01-28 DIAGNOSIS — N433 Hydrocele, unspecified: Secondary | ICD-10-CM

## 2018-01-28 DIAGNOSIS — N432 Other hydrocele: Secondary | ICD-10-CM

## 2018-01-28 NOTE — ED Triage Notes (Signed)
Pt sts right testicle pain and swelling since lifting heavy glass 10 days ago getting more severe

## 2018-01-28 NOTE — ED Provider Notes (Signed)
[facility]    CSN: [id number] Arrival date & time: 01/28/18  1631     History   Chief Complaint Chief Complaint  Patient presents with  . Testicle Pain    HPI Anthony Harris is a 42 y.o. male.   Patient has pain in the right side of scrotum.  He has a history of double hernia repair some years ago.  He was  lifting a heavy piece of glass and felt a tear in his right groin.  Since then he has noted increased pain in the groin as well as swelling in the scrotum on that side.  He has no urinary symptoms.  HPI  Past Medical History:  Diagnosis Date  . Anxiety   . Arthritis   . Attention deficit disorder   . GERD (gastroesophageal reflux disease)   . Headache(784.0)   . Lumbar disc herniation     Patient Active Problem List   Diagnosis Date Noted  . Right inguinal hernia 07/14/2013  . Muscle spasm of back 06/21/2012  . Lumbar disc herniation 06/21/2012    Past Surgical History:  Procedure Laterality Date  . HERNIA REPAIR    . INGUINAL HERNIA REPAIR Right 07/16/2013   Procedure: HERNIA REPAIR INGUINAL ADULT;  Surgeon: Ernestene Mention, MD;  Location: [facility] OR;  Service: General;  Laterality: Right;  . INSERTION OF MESH Right 07/16/2013   Procedure: INSERTION OF MESH;  Surgeon: Ernestene Mention, MD;  Location: [facility] OR;  Service: General;  Laterality: Right;  . LUMBAR EPIDURAL INJECTION     Hx; of for pain  . TONSILLECTOMY         Home Medications    Prior to Admission medications   Medication Sig Start Date End Date Taking? Authorizing Provider  amphetamine-dextroamphetamine (ADDERALL) 30 MG tablet Take 30 mg by mouth daily.    [provider]  calcium carbonate (TUMS - DOSED IN MG ELEMENTAL CALCIUM) 500 MG chewable tablet Chew 3-4 tablets by mouth daily as needed for indigestion or heartburn.    [provider]  gabapentin (NEURONTIN) 300 MG capsule Take 300 mg by mouth 3 (three) times daily.    [provider]    HYDROcodone-acetaminophen (NORCO/VICODIN) 5-325 MG per tablet Take 1-2 tablets by mouth every 4 (four) hours as needed for moderate pain.    [provider]  hydrocortisone cream 1 % Apply 1 application topically 2 (two) times daily.    [provider]  methocarbamol (ROBAXIN) 500 MG tablet Take 1 tablet (500 mg total) by mouth every 6 (six) hours as needed for muscle spasms. 04/26/14   Azalia Bilis, MD  methocarbamol (ROBAXIN) 750 MG tablet Take 750 mg by mouth every 6 (six) hours as needed for muscle spasms.    [provider]  oxyCODONE-acetaminophen (PERCOCET) 10-325 MG per tablet Take 1-2 tablets by mouth every 6 (six) hours as needed for pain.    [provider]  oxyCODONE-acetaminophen (PERCOCET/ROXICET) 5-325 MG per tablet Take 1 tablet by mouth every 4 (four) hours as needed for severe pain. 04/26/14   Azalia Bilis, MD  ranitidine (ZANTAC) 150 MG tablet Take 150 mg by mouth daily as needed for heartburn.    [provider]  triamcinolone cream (KENALOG) 0.1 % Apply 1 application topically daily as needed (itching).    [provider]    Family History Family History  Problem Relation Age of Onset  . Stroke Father   . Hypercholesterolemia Father   . Diabetes Sister   .  Diabetes Brother   . Hypertension Other   . Cancer - Lung Other     Social History Social History   Tobacco Use  . Smoking status: Former Smoker    Types: Cigarettes  . Smokeless tobacco: Never Used  Substance Use Topics  . Alcohol use: Yes    Comment: Social drinking  . Drug use: No    Comment: No active drug use. History of past drug use.     Allergies   Patient has no known allergies.   Review of Systems Review of Systems  Constitutional: Negative for chills and fever.  HENT: Negative for ear pain and sore throat.   Eyes: Negative for pain and visual disturbance.  Respiratory: Negative for cough and shortness of breath.   Cardiovascular:  Negative for chest pain and palpitations.  Gastrointestinal: Negative for abdominal pain and vomiting.  Genitourinary: Positive for scrotal swelling. Negative for dysuria and hematuria.  Musculoskeletal: Negative for arthralgias and back pain.  Skin: Negative for color change and rash.  Neurological: Negative for seizures and syncope.  All other systems reviewed and are negative.    Physical Exam Triage Vital Signs ED Triage Vitals [01/28/18 1654]  Enc Vitals Group     BP (!) 145/95     Pulse Rate 85     Resp 18     Temp 98.6 F (37 C)     Temp Source Oral     SpO2 100 %     Weight      Height      Head Circumference      Peak Flow      Pain Score      Pain Loc      Pain Edu?      Excl. in GC?    No data found.  Updated Vital Signs BP (!) 145/95 (BP Location: Left Arm)   Pulse 85   Temp 98.6 F (37 C) (Oral)   Resp 18   SpO2 100%   Visual Acuity Right Eye Distance:   Left Eye Distance:   Bilateral Distance:    Right Eye Near:   Left Eye Near:    Bilateral Near:     Physical Exam  Constitutional: He appears well-developed and well-nourished.  Cardiovascular: Normal rate.  Pulmonary/Chest: Effort normal.  Genitourinary:  Genitourinary Comments: There is swelling in the right groin.  Right scrotum has large amount of fluid, is tender.  Transilluminates with otoscope light.     UC Treatments / Results  Labs (all labs ordered are listed, but only abnormal results are displayed) Labs Reviewed - No data to display  EKG None  Radiology No results found.  Procedures Procedures (including critical care time)  Medications Ordered in UC Medications - No data to display  Initial Impression / Assessment and Plan / UC Course  I have reviewed the triage vital signs and the nursing notes.  Pertinent labs & imaging results that were available during my care of the patient were reviewed by me and considered in my medical decision making (see chart for  details).     Right hydrocele with hernia likely Final Clinical Impressions(s) / UC Diagnoses   Final diagnoses:  None   Discharge Instructions   None    ED Prescriptions    None     Controlled Substance Prescriptions [place] Controlled Substance Registry consulted? No   Frederica KusterMiller, Terrina Docter M, MD 01/28/18 1739

## 2018-01-29 ENCOUNTER — Telehealth (HOSPITAL_COMMUNITY): Payer: Self-pay

## 2018-01-29 ENCOUNTER — Other Ambulatory Visit (HOSPITAL_COMMUNITY): Payer: Self-pay | Admitting: Family Medicine

## 2018-01-29 ENCOUNTER — Ambulatory Visit (HOSPITAL_COMMUNITY)
Admission: RE | Admit: 2018-01-29 | Discharge: 2018-01-29 | Disposition: A | Discharge status: Home / Self Care | Payer: 59 | Source: Ambulatory Visit | Attending: Family Medicine | Admitting: Family Medicine

## 2018-01-29 DIAGNOSIS — R609 Edema, unspecified: Secondary | ICD-10-CM | POA: Diagnosis not present

## 2018-01-29 DIAGNOSIS — N50811 Right testicular pain: Secondary | ICD-10-CM | POA: Diagnosis not present

## 2018-01-29 NOTE — Telephone Encounter (Signed)
Per Dr. Delton SeeNelson results are normal, follow up with PCP. Attempted to reach patient. No answer at this time.

## 2018-02-04 ENCOUNTER — Other Ambulatory Visit: Payer: Self-pay

## 2018-02-04 ENCOUNTER — Encounter (HOSPITAL_COMMUNITY): Payer: Self-pay | Admitting: Emergency Medicine

## 2018-02-04 ENCOUNTER — Emergency Department (HOSPITAL_COMMUNITY): Payer: 59

## 2018-02-04 ENCOUNTER — Emergency Department (HOSPITAL_COMMUNITY)
Admission: EM | Admit: 2018-02-04 | Discharge: 2018-02-04 | Disposition: A | Discharge status: Home / Self Care | Payer: 59 | Attending: Emergency Medicine | Admitting: Emergency Medicine

## 2018-02-04 DIAGNOSIS — N50811 Right testicular pain: Secondary | ICD-10-CM | POA: Diagnosis present

## 2018-02-04 DIAGNOSIS — Z79899 Other long term (current) drug therapy: Secondary | ICD-10-CM | POA: Diagnosis not present

## 2018-02-04 DIAGNOSIS — Z87891 Personal history of nicotine dependence: Secondary | ICD-10-CM | POA: Insufficient documentation

## 2018-02-04 DIAGNOSIS — N453 Epididymo-orchitis: Secondary | ICD-10-CM | POA: Diagnosis not present

## 2018-02-04 DIAGNOSIS — N433 Hydrocele, unspecified: Secondary | ICD-10-CM | POA: Diagnosis not present

## 2018-02-04 LAB — CBC WITH DIFFERENTIAL/PLATELET
Abs Immature Granulocytes: 0.1 10*3/uL (ref 0.0–0.1)
BASOS PCT: 0 %
Basophils Absolute: 0.1 10*3/uL (ref 0.0–0.1)
Eosinophils Absolute: 0 10*3/uL (ref 0.0–0.7)
Eosinophils Relative: 0 %
HEMATOCRIT: 45.2 % (ref 39.0–52.0)
Hemoglobin: 15.2 g/dL (ref 13.0–17.0)
Immature Granulocytes: 1 %
Lymphocytes Relative: 6 %
Lymphs Abs: 1.3 10*3/uL (ref 0.7–4.0)
MCH: 30.5 pg (ref 26.0–34.0)
MCHC: 33.6 g/dL (ref 30.0–36.0)
MCV: 90.6 fL (ref 78.0–100.0)
MONOS PCT: 9 %
Monocytes Absolute: 2.2 10*3/uL — ABNORMAL HIGH (ref 0.1–1.0)
NEUTROS PCT: 84 %
Neutro Abs: 19.5 10*3/uL — ABNORMAL HIGH (ref 1.7–7.7)
PLATELETS: 307 10*3/uL (ref 150–400)
RBC: 4.99 MIL/uL (ref 4.22–5.81)
RDW: 12.9 % (ref 11.5–15.5)
WBC: 23.2 10*3/uL — ABNORMAL HIGH (ref 4.0–10.5)

## 2018-02-04 LAB — BASIC METABOLIC PANEL
Anion gap: 13 (ref 5–15)
BUN: 11 mg/dL (ref 6–20)
CALCIUM: 9.3 mg/dL (ref 8.9–10.3)
CO2: 25 mmol/L (ref 22–32)
Chloride: 99 mmol/L (ref 98–111)
Creatinine, Ser: 1.17 mg/dL (ref 0.61–1.24)
GFR calc non Af Amer: >60 mL/min (ref >60)
Glucose, Bld: 95 mg/dL (ref 70–99)
Potassium: 4.1 mmol/L (ref 3.5–5.1)
Sodium: 137 mmol/L (ref 135–145)

## 2018-02-04 LAB — URINALYSIS, ROUTINE W REFLEX MICROSCOPIC
Bilirubin Urine: NEGATIVE
Glucose, UA: NEGATIVE mg/dL
HGB URINE DIPSTICK: NEGATIVE
Ketones, ur: NEGATIVE mg/dL
Leukocytes, UA: NEGATIVE
NITRITE: NEGATIVE
PH: 6 (ref 5.0–8.0)
Protein, ur: NEGATIVE mg/dL
Specific Gravity, Urine: 1.023 (ref 1.005–1.030)

## 2018-02-04 MED ORDER — ONDANSETRON HCL 4 MG/2ML IJ SOLN
4.0000 mg | Freq: Once | INTRAMUSCULAR | Status: AC
  Administered 2018-02-04: 4 mg via INTRAVENOUS
  Filled 2018-02-04: qty 2

## 2018-02-04 MED ORDER — FENTANYL CITRATE (PF) 100 MCG/2ML IJ SOLN
100.0000 ug | INTRAMUSCULAR | Status: DC | PRN
  Administered 2018-02-04 (×2): 100 ug via INTRAVENOUS
  Filled 2018-02-04 (×2): qty 2

## 2018-02-04 MED ORDER — SODIUM CHLORIDE 0.9 % IV SOLN
INTRAVENOUS | Status: DC
  Administered 2018-02-04: 14:00:00 via INTRAVENOUS

## 2018-02-04 MED ORDER — OXYCODONE-ACETAMINOPHEN 5-325 MG PO TABS
1.0000 | ORAL_TABLET | Freq: Once | ORAL | Status: AC
  Administered 2018-02-04: 1 via ORAL
  Filled 2018-02-04: qty 1

## 2018-02-04 MED ORDER — LEVOFLOXACIN 500 MG PO TABS
500.0000 mg | ORAL_TABLET | Freq: Once | ORAL | Status: AC
  Administered 2018-02-04: 500 mg via ORAL
  Filled 2018-02-04: qty 1

## 2018-02-04 MED ORDER — OXYCODONE-ACETAMINOPHEN 5-325 MG PO TABS: 1.0000 | ORAL_TABLET | ORAL | 0 refills | Status: DC | PRN

## 2018-02-04 MED ORDER — LEVOFLOXACIN 500 MG PO TABS: 500.0000 mg | ORAL_TABLET | Freq: Every day | ORAL | 0 refills | Status: DC

## 2018-02-04 NOTE — ED Provider Notes (Addendum)
[facility]   CSN: [id number] Arrival date & time: 02/04/18  1013     History   Chief Complaint Chief Complaint  Patient presents with  . Groin Pain  . Testicle Pain    HPI Anthony Harris is a 42 y.o. male.  HPI   Reports onset of right groin and testicle pain 3 weeks ago after lifting heavy glass at work.  Swelling in his scrotum persistent pain with pain occasionally radiating to the right low back.  Has history of chronic back disease with lumbar surgical procedure and lumbar injections.  He denies dysuria, urinary frequency, fever, chills, nausea or vomiting.  About 1 week ago he was evaluated with Doppler imaging, which was reassuring and did not show any acute abnormalities.  He has history of bilateral groin hernia repair, remotely.  There are no other known modifying factors.  Past Medical History:  Diagnosis Date  . Anxiety   . Arthritis   . Attention deficit disorder   . GERD (gastroesophageal reflux disease)   . Headache(784.0)   . Lumbar disc herniation     Patient Active Problem List   Diagnosis Date Noted  . Right inguinal hernia 07/14/2013  . Muscle spasm of back 06/21/2012  . Lumbar disc herniation 06/21/2012    Past Surgical History:  Procedure Laterality Date  . HERNIA REPAIR    . INGUINAL HERNIA REPAIR Right 07/16/2013   Procedure: HERNIA REPAIR INGUINAL ADULT;  Surgeon: Ernestene MentionHaywood M Ingram, MD;  [facility];  Service: General;  Laterality: Right;  . INSERTION OF MESH Right 07/16/2013   Procedure: INSERTION OF MESH;  Surgeon: Ernestene MentionHaywood M Ingram, MD;  [facility];  Service: General;  Laterality: Right;  . LUMBAR EPIDURAL INJECTION     Hx; of for pain  . TONSILLECTOMY          Home Medications    Prior to Admission medications   Medication Sig Start Date End Date Taking? Authorizing Provider  esomeprazole (NEXIUM 24HR) 20 MG capsule Take 20 mg by mouth daily.   Yes [provider]  ibuprofen (ADVIL,MOTRIN) 200 MG tablet Take 800 mg by mouth every 6 (six) hours as needed (for pain or headaches).   Yes [provider]  ranitidine (ZANTAC) 150 MG tablet Take 150 mg by mouth daily.    Yes [provider]  levofloxacin (LEVAQUIN) 500 MG tablet Take 1 tablet (500 mg total) by mouth daily. 02/04/18   Mancel BaleWentz, Halbert Jesson, MD  methocarbamol (ROBAXIN) 500 MG tablet Take 1 tablet (500 mg total) by mouth every 6 (six) hours as needed for muscle spasms. Patient not taking: Reported on 02/04/2018 04/26/14   Azalia Bilisampos, Kevin, MD  oxyCODONE-acetaminophen (PERCOCET) 5-325 MG tablet Take 1 tablet by mouth every 4 (four) hours as needed for severe pain. 02/04/18   Mancel BaleWentz, Clyde Zarrella, MD    Family History Family History  Problem Relation Age of Onset  . Stroke Father   . Hypercholesterolemia Father   . Diabetes Sister   . Diabetes Brother   . Hypertension Other   . Cancer - Lung Other     Social History Social History   Tobacco Use  . Smoking status: Former Smoker    Types: Cigarettes  . Smokeless tobacco: Never Used  Substance Use Topics  . Alcohol use: Yes    Comment: Social drinking  . Drug use: No    Comment: No active drug use. History of past drug use.  Allergies   Patient has no known allergies.   Review of Systems Review of Systems  All other systems reviewed and are negative.    Physical Exam Updated Vital Signs BP 118/77   Pulse 97   Temp 98.4 F (36.9 C) (Oral)   Resp 14   Ht 5\' 10"  (1.778 m)   Wt 99.8 kg (220 lb)   SpO2 97%   BMI 31.57 kg/m   Physical Exam  Constitutional: He is oriented to person, place, and time. He appears well-developed and well-nourished.  HENT:  Head: Normocephalic and atraumatic.  Right Ear: External ear normal.  Left Ear: External ear normal.  Eyes: Pupils are equal, round, and reactive to light. Conjunctivae and EOM are normal.  Neck: Normal range of motion and phonation normal. Neck supple.    Cardiovascular: Normal rate, regular rhythm and normal heart sounds.  Pulmonary/Chest: Effort normal and breath sounds normal. He exhibits no bony tenderness.  Abdominal: Soft. He exhibits no mass. There is no tenderness. There is no guarding.  Genitourinary:  Genitourinary Comments: Normal penis.  Normal left hemiscrotum and left testicle.  Right hemiscrotum is somewhat edematous, there is a mass in the inferior scrotum which might be testicle however it is difficult to palpate because of extreme pain with light touch.  There is no cute abnormality of either the left or right inguinal region.  There are surgical wounds in both inguinal regions, which appear well approximated.  Musculoskeletal: Normal range of motion.  Neurological: He is alert and oriented to person, place, and time. No cranial nerve deficit or sensory deficit. He exhibits normal muscle tone. Coordination normal.  Skin: Skin is warm, dry and intact.  Psychiatric: He has a normal mood and affect. His behavior is normal. Judgment and thought content normal.  Nursing note and vitals reviewed.    ED Treatments / Results  Labs (all labs ordered are listed, but only abnormal results are displayed) Labs Reviewed  CBC WITH DIFFERENTIAL/PLATELET - Abnormal; Notable for the following components:      Result Value   WBC 23.2 (*)    Neutro Abs 19.5 (*)    Monocytes Absolute 2.2 (*)    All other components within normal limits  URINE CULTURE  BASIC METABOLIC PANEL  URINALYSIS, ROUTINE W REFLEX MICROSCOPIC    EKG None  Radiology US Scrotum W/doppler  Result Date: 02/04/2018 CLINICAL DATA:  Scrotal pain. EXAM: SCROTAL ULTRASOUND DOPPLER ULTRASOUND OF THE TESTICLES TECHNIQUE: Complete ultrasound examination of the testicles, epididymis, and other scrotal structures was performed. Color and spectral Doppler ultrasound were also utilized to evaluate blood flow to the testicles. COMPARISON:  Ultrasound of January 29, 2018. FINDINGS:  Right testicle Measurements: 4.9 x 4.0 x 3.5 cm. No mass or microlithiasis visualized. Left testicle Measurements: 4.7 x 3.2 x 2.6 cm. No mass or microlithiasis visualized. Right epididymis: Enlarged and hypervascular suggesting epididymitis. Left epididymis:  Normal in size and appearance. Hydrocele:  Mild septated right hydrocele is noted. Varicocele:  None visualized. Pulsed Doppler interrogation of both testes demonstrates normal low resistance arterial and venous waveforms bilaterally. However, right testicle is hypervascular suggesting orchitis. No definite inguinal hernia is noted. IMPRESSION: Hypervascular right testicle and epididymis consistent with right epididymo-orchitis. Mild septated right hydrocele is noted as well. Electronically Signed   By: Lupita Raider, M.D.   On: 02/04/2018 15:24    Procedures Procedures (including critical care time)  Medications Ordered in ED Medications  fentaNYL (SUBLIMAZE) injection 100 mcg (100 mcg Intravenous Given  02/04/18 1604)  0.9 %  sodium chloride infusion ( Intravenous New Bag/Given 02/04/18 1332)  levofloxacin (LEVAQUIN) tablet 500 mg (has no administration in time range)  oxyCODONE-acetaminophen (PERCOCET/ROXICET) 5-325 MG per tablet 1 tablet (has no administration in time range)  ondansetron (ZOFRAN) injection 4 mg (4 mg Intravenous Given 02/04/18 1317)     Initial Impression / Assessment and Plan / ED Course  I have reviewed the triage vital signs and the nursing notes.  Pertinent labs & imaging results that were available during my care of the patient were reviewed by me and considered in my medical decision making (see chart for details).  Clinical Course as of Feb 04 1713  Mon Feb 04, 2018  1641 Normal  Basic metabolic panel [EW]  1641 Normal except white count 23.2  CBC with Differential(!) [EW]  1714 Consistent with epididymal orchitis right side.  No evident hernia.  US SCROTUM W/DOPPLER [EW]    Clinical Course User  Index [EW] Mancel Bale, MD     Patient Vitals for the past 24 hrs:  BP Temp Temp src Pulse Resp SpO2 Height Weight  02/04/18 1700 118/77 - - 97 14 97 % - -  02/04/18 1606 120/81 - - 73 16 99 % - -  02/04/18 1300 102/76 - - 72 16 100 % - -  02/04/18 1137 115/83 98.4 F (36.9 C) Oral 76 18 100 % - -  02/04/18 1019 - - - - - - 5\' 10"  (1.778 m) 99.8 kg (220 lb)  02/04/18 1018 112/83 98.9 F (37.2 C) Oral 96 19 98 % - -    4:47 PM Reevaluation with update and discussion. After initial assessment and treatment, an updated evaluation reveals he is more comfortable now after second dose of fentanyl.  Patient denies STD risk.  Findings discussed with the patient and all questions were answered. Mancel Bale   Medical Decision Making: Testicular pain secondary to epididymal orchitis.  Doubt STD, no urethritis symptoms or findings.  Doubt sepsis, hernia, soft tissue injury.  CRITICAL CARE-no  Nursing Notes Reviewed/ Care Coordinated Applicable Imaging Reviewed Interpretation of Laboratory Data incorporated into ED treatment  The patient appears reasonably screened and/or stabilized for discharge and I doubt any other medical condition or other [facility] requiring further screening, evaluation, or treatment in the ED at this time prior to discharge.  Plan: Home Medications-OTC analgesia; Home Treatments-scrotal support ice treatment; return here if the recommended treatment, does not improve the symptoms; Recommended follow up-urology follow-up 1 to 2 weeks and as needed    Final Clinical Impressions(s) / ED Diagnoses   Final diagnoses:  Orchitis and epididymitis    ED Discharge Orders        Ordered    oxyCODONE-acetaminophen (PERCOCET) 5-325 MG tablet  Every 4 hours PRN     02/04/18 1709    levofloxacin (LEVAQUIN) 500 MG tablet  Daily     02/04/18 1709       Mancel Bale, MD 02/04/18 1713    Mancel Bale, MD 02/04/18 1714

## 2018-02-04 NOTE — ED Triage Notes (Signed)
Pt. Stated, I started having some rt. Groin pain a week ago and had a US and didn't show anything. Im having shooting pains all the to my back.

## 2018-02-04 NOTE — ED Notes (Signed)
Pt given ice water per Dr. Effie ShyWentz.

## 2018-02-04 NOTE — ED Notes (Signed)
Pt transported to US

## 2018-02-04 NOTE — Discharge Instructions (Addendum)
Your condition, can be very painful.  Use scrotal support such as a jockstrap to help your discomfort.  This helps elevate the scrotum and supported to prevent pain.  Sometimes putting a towel between your legs while lying down to help elevate the scrotum will help.  Rest as much as possible.  No work for several days.  Follow-up with the urologist for further evaluation and treatment in 7 to 10 days to make sure that you are improving.  Your prescriptions were sent to your pharmacy of choice.

## 2018-02-04 NOTE — ED Notes (Signed)
Patient verbalizes understanding of discharge instructions. Opportunity for questioning and answers were provided. Armband removed by staff, pt discharged from ED.  

## 2018-02-04 NOTE — ED Notes (Signed)
Pt returned to room from ultrasound.

## 2018-02-05 LAB — URINE CULTURE: Culture: NO GROWTH

## 2018-02-08 DIAGNOSIS — N451 Epididymitis: Secondary | ICD-10-CM | POA: Diagnosis not present

## 2018-10-28 IMAGING — US US PELVIS LIMITED
1 series · 14 of 25 positions shown · non-contrast
Comparison: None.

CLINICAL DATA: Right testicular pain and swelling since lifting a a
heavy glass object 10 days ago.

EXAM:
SCROTAL ULTRASOUND
DOPPLER ULTRASOUND OF THE TESTICLES
TECHNIQUE: Complete ultrasound examination of the testicles, epididymis, and
other scrotal structures was performed. Color and spectral Doppler
ultrasound were also utilized to evaluate blood flow to the
testicles.

[Series 1: us pelvis limited · 0.07mm/px · 47 acquisitions, 14 frames shown]
[im 1/47]
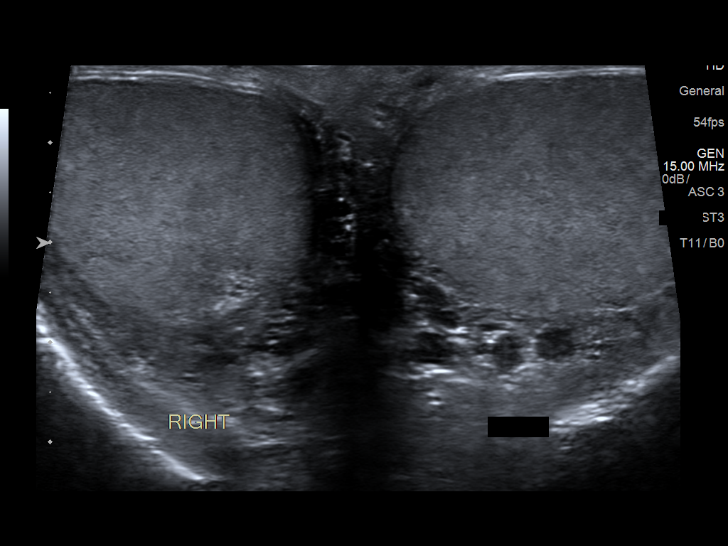
[im 4/47]
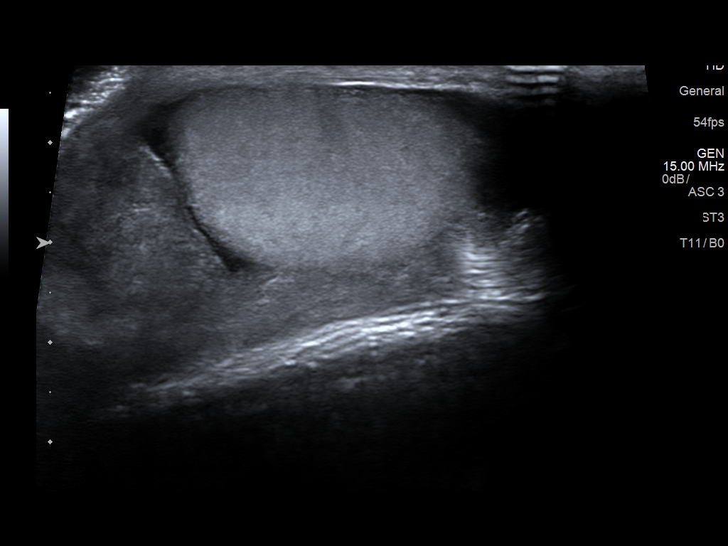
[im 8/47]
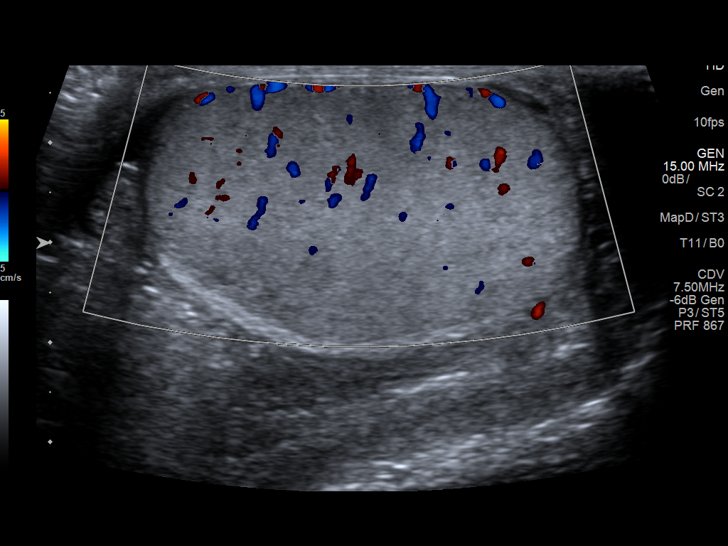
[im 12/47]
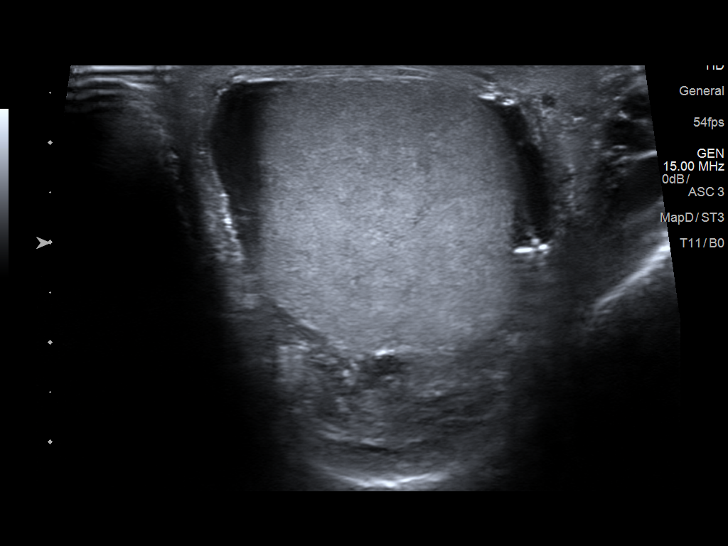
[im 16/47]
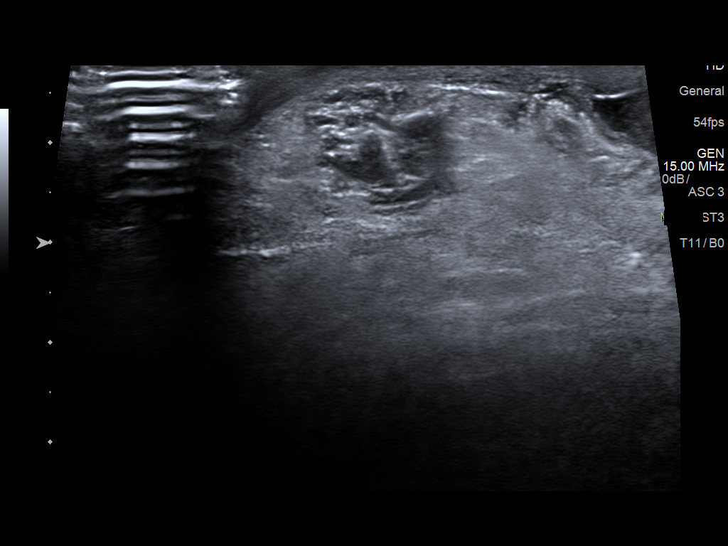
[im 18/47]
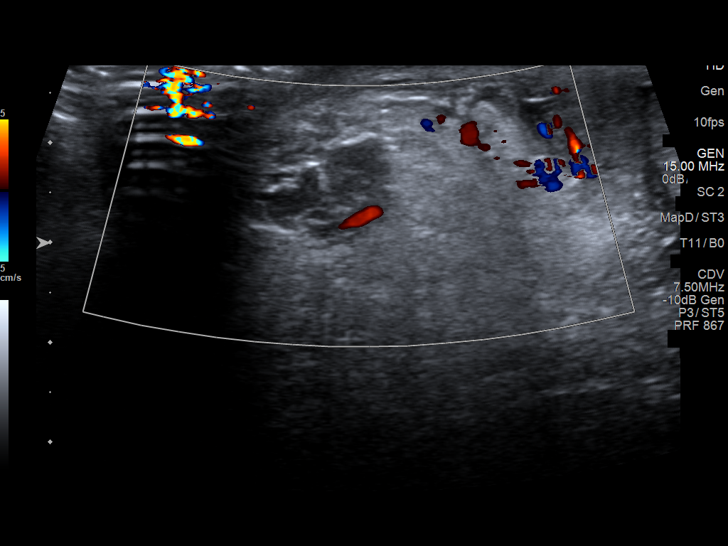
[im 22/47]
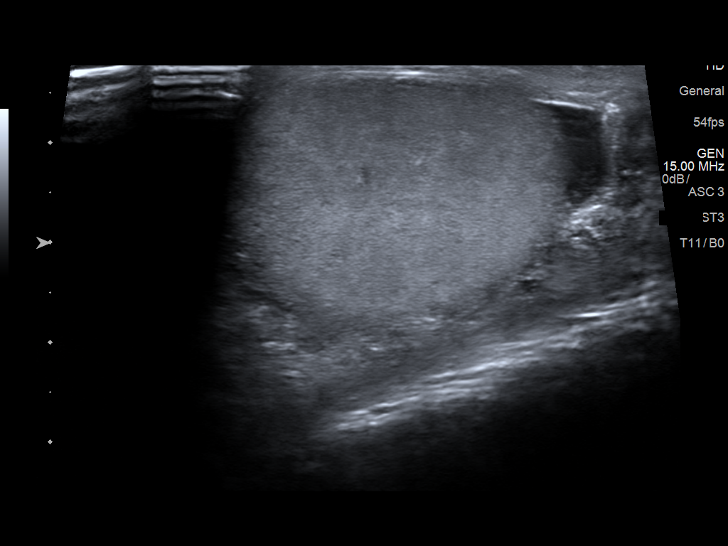
[im 25/47]
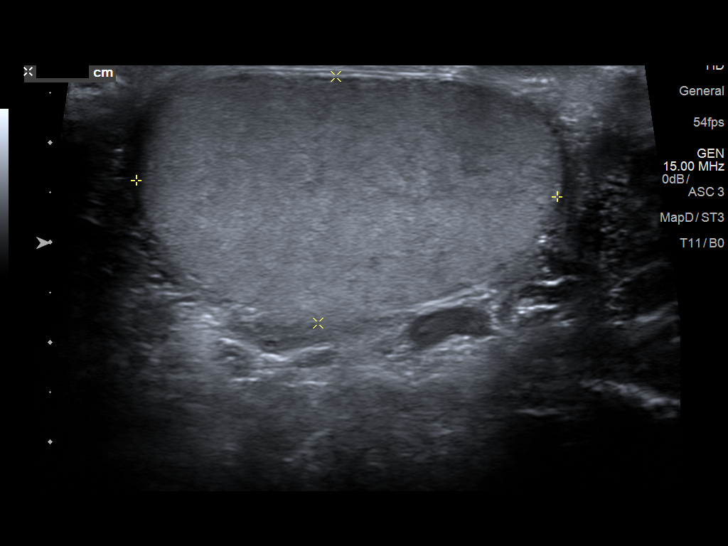
[im 29/47]
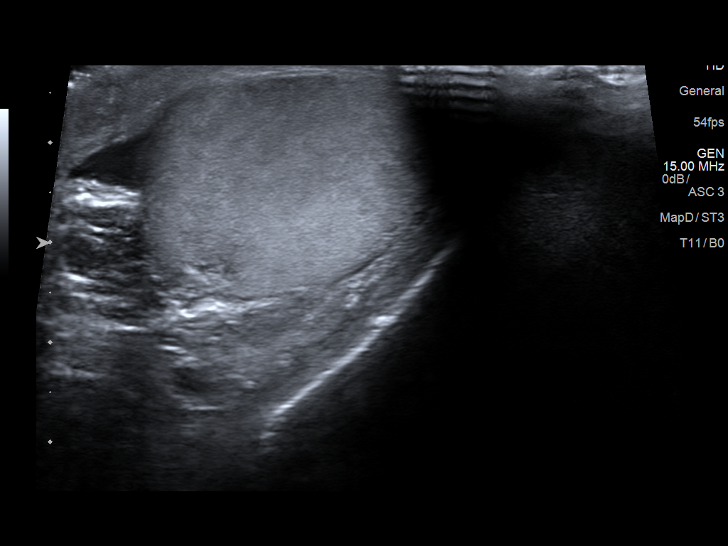
[im 31/47]
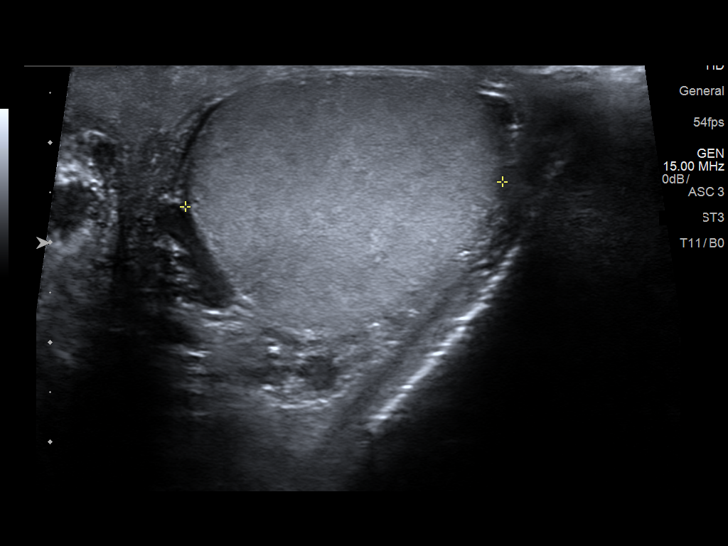
[im 35/47]
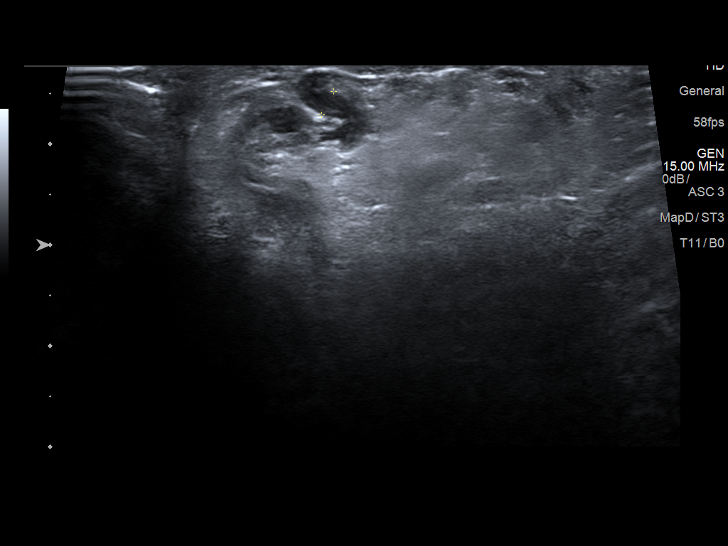
[im 39/47]
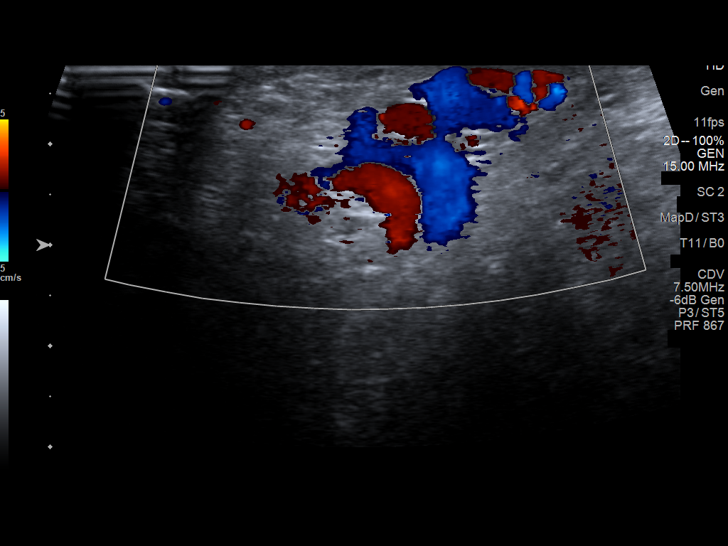
[im 43/47]
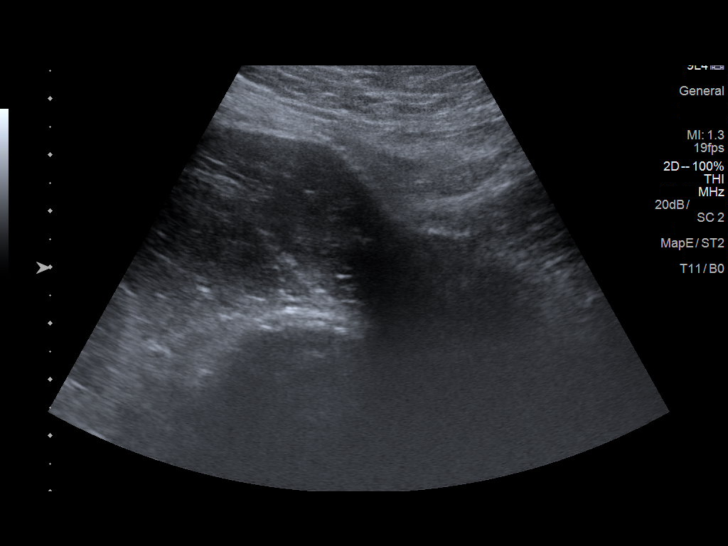
[im 47/47]
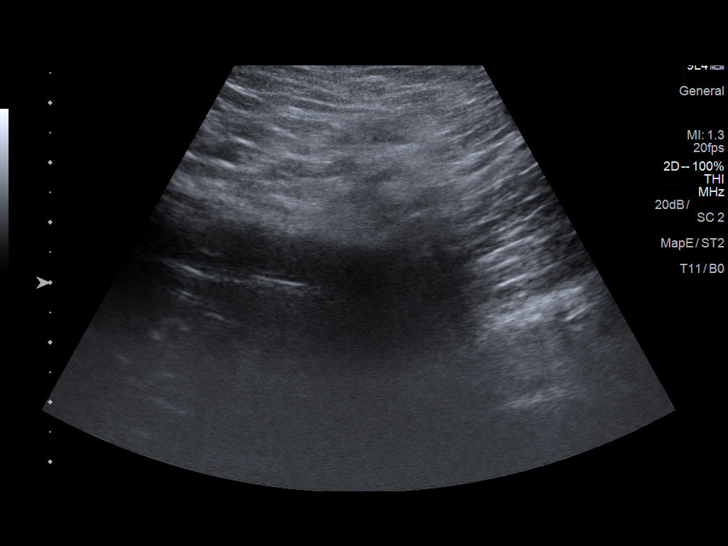

[14 of 25 positions shown; findings below may reference images not displayed]

FINDINGS: Right testicle

Measurements: 4.5 x 2.6 x 2.7 cm. No mass or microlithiasis
visualized.

Left testicle

Measurements: 4.2 x 2.5 x 3.2 cm. No mass or microlithiasis
visualized.

Right epididymis:  Normal in size and appearance.

Left epididymis:  Normal in size and appearance.

Hydrocele:  None visualized.

Varicocele:  None visualized.

Pulsed Doppler interrogation of both testes demonstrates normal low
resistance arterial and venous waveforms bilaterally.

No inguinal hernia is observed.
IMPRESSION: No evidence of testicular masses nor orchitis. Vascularity of the
testes is normal. Normal appearance of the epididymal structures. No
evidence of an inguinal hernia.

## 2018-11-03 IMAGING — US US SCROTUM W/ DOPPLER COMPLETE
1 series · 14 of 25 positions shown · non-contrast
Comparison: Ultrasound January 29, 2018.

CLINICAL DATA: Scrotal pain.

EXAM:
SCROTAL ULTRASOUND
DOPPLER ULTRASOUND OF THE TESTICLES
TECHNIQUE: Complete ultrasound examination of the testicles, epididymis, and
other scrotal structures was performed. Color and spectral Doppler
ultrasound were also utilized to evaluate blood flow to the
testicles.

[Series 1: us scrotum w/ doppler complete · 0.09mm/px · 14 of 56 slices shown]
[im 1/56]
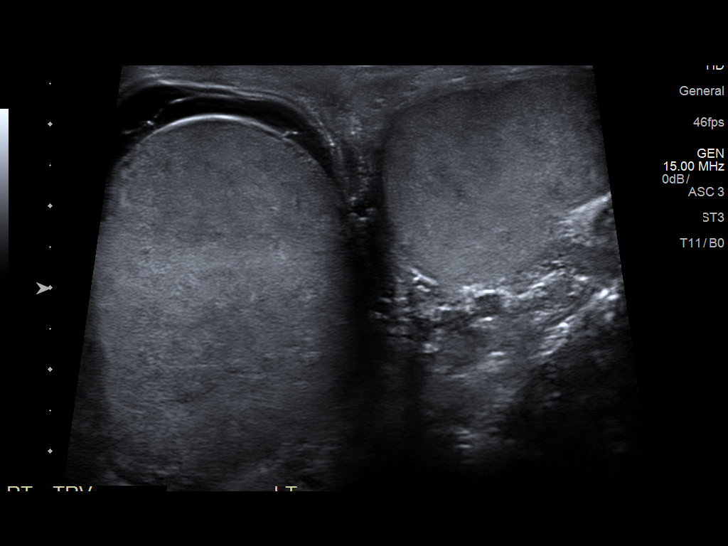
[im 5/56]
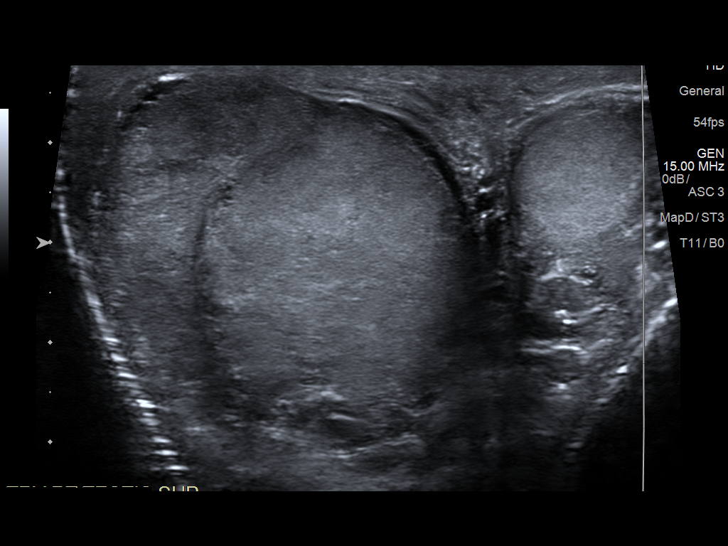
[im 10/56]
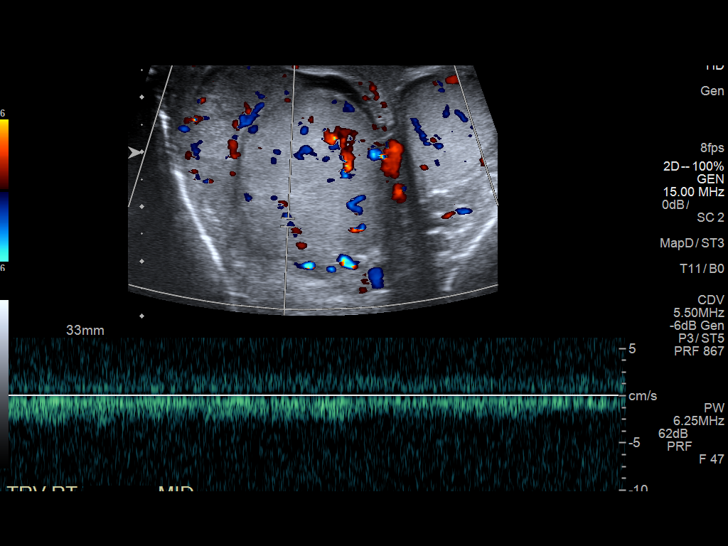
[im 14/56]
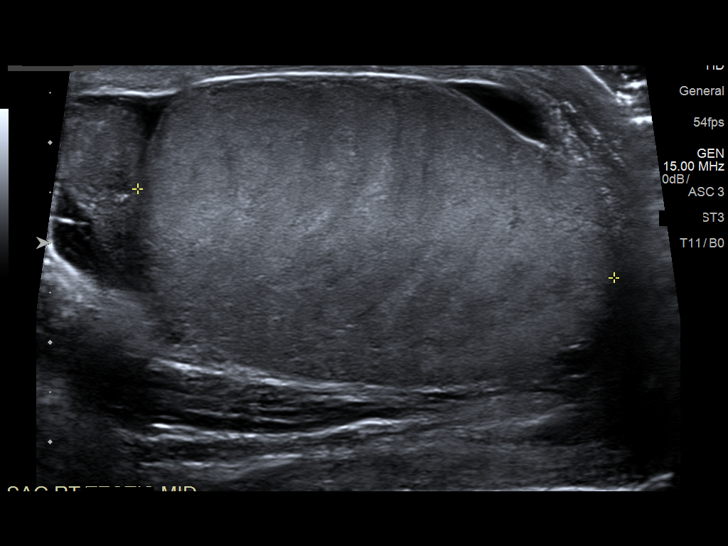
[im 19/56]
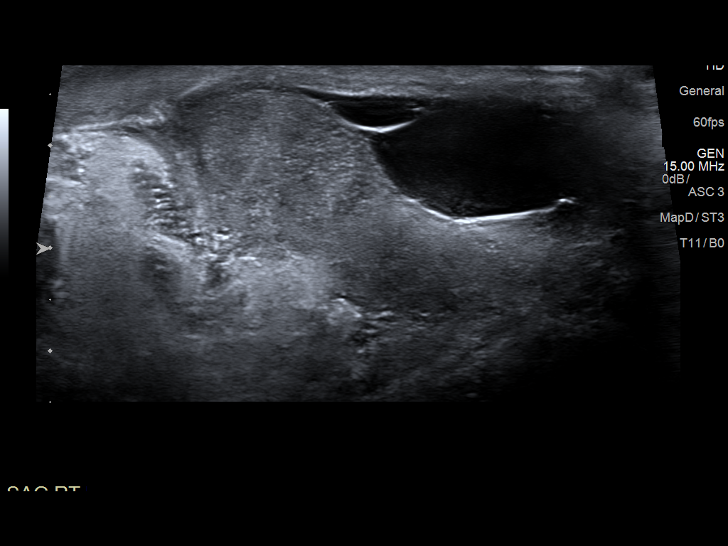
[im 21/56]
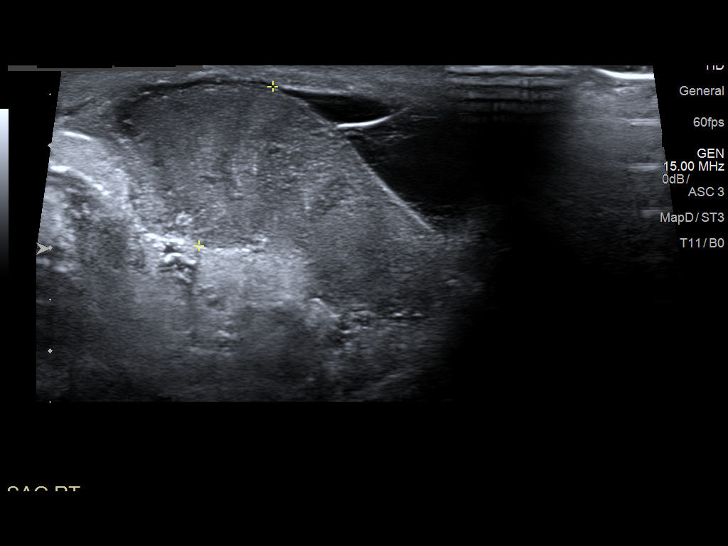
[im 26/56]
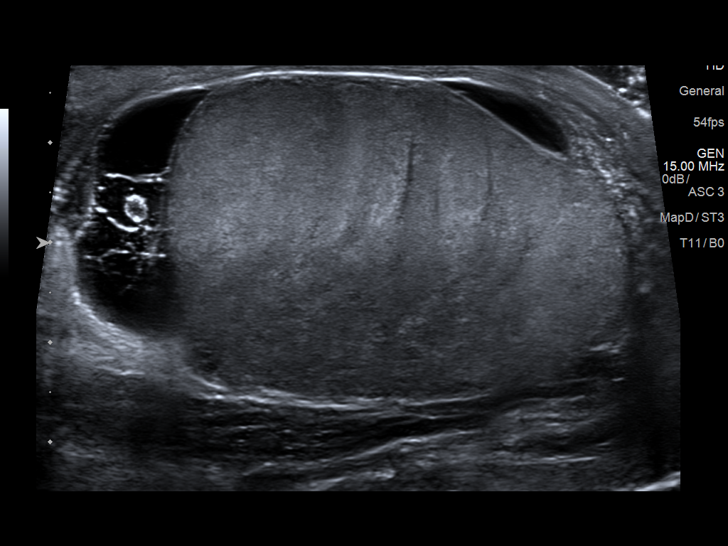
[im 30/56]
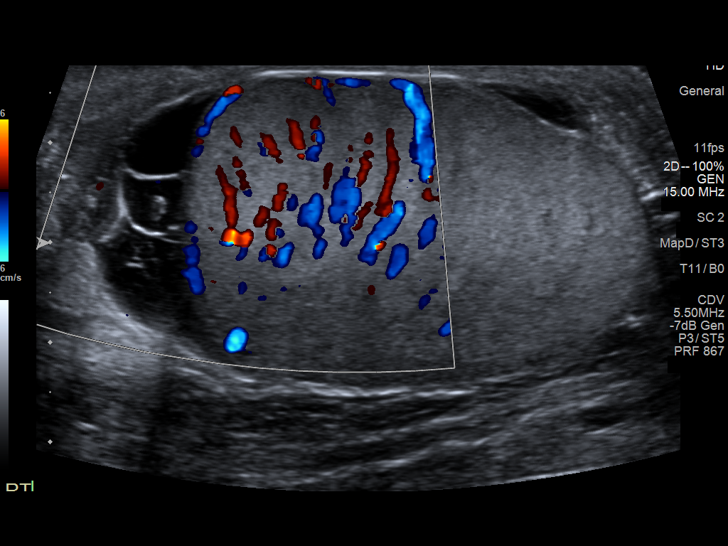
[im 35/56]
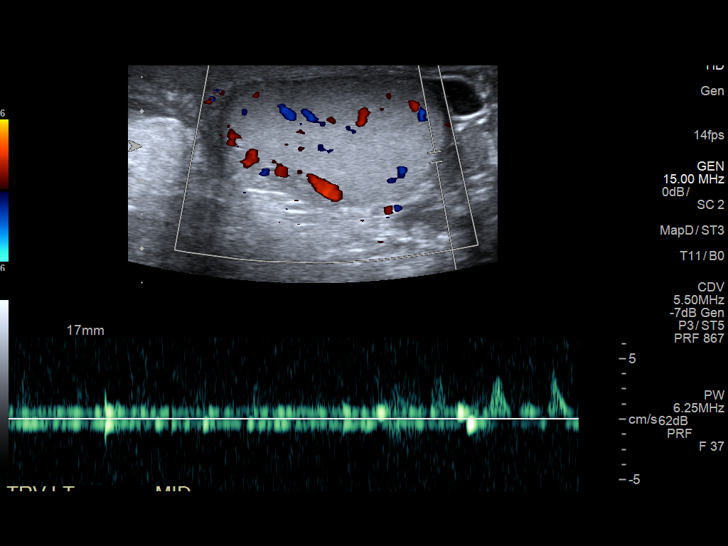
[im 37/56]
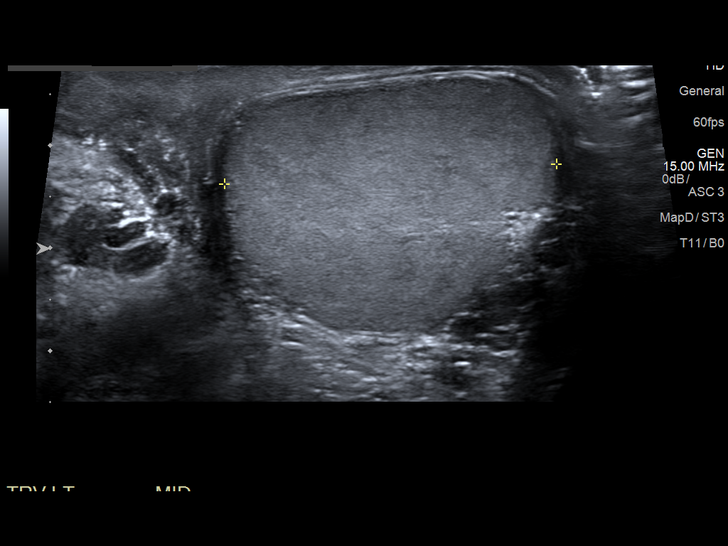
[im 42/56]
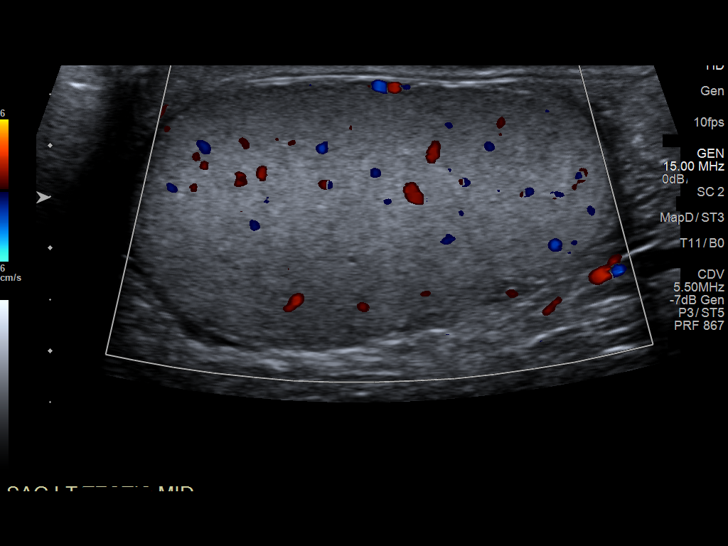
[im 46/56]
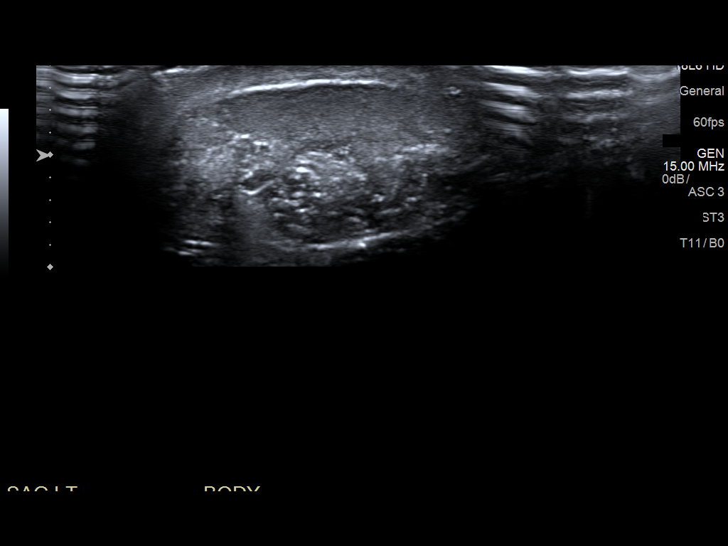
[im 51/56]
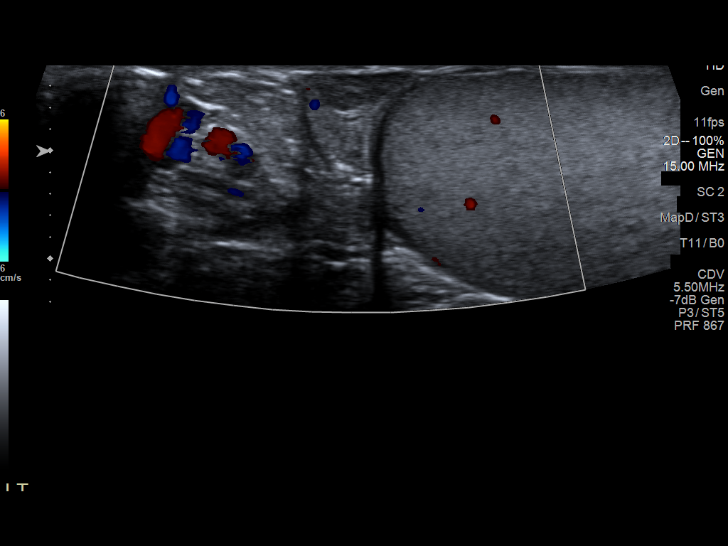
[im 56/56]
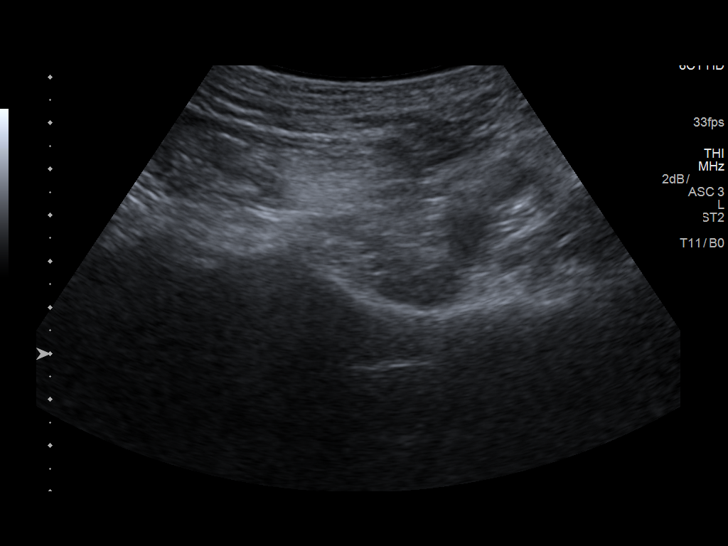

[14 of 25 positions shown; findings below may reference images not displayed]

FINDINGS: Right testicle

Measurements: 4.9 x 4.0 x 3.5 cm. No mass or microlithiasis
visualized.

Left testicle

Measurements: 4.7 x 3.2 x 2.6 cm. No mass or microlithiasis
visualized.

Right epididymis: Enlarged and hypervascular suggesting
epididymitis.

Left epididymis:  Normal in size and appearance.

Hydrocele:  Mild septated right hydrocele is noted.

Varicocele:  None visualized.

Pulsed Doppler interrogation of both testes demonstrates normal low
resistance arterial and venous waveforms bilaterally. However, right
testicle is hypervascular suggesting orchitis.

No definite inguinal hernia is noted.
IMPRESSION: Hypervascular right testicle and epididymis consistent with right
epididymo-orchitis. Mild septated right hydrocele is noted as well.

## 2020-04-14 ENCOUNTER — Emergency Department (HOSPITAL_COMMUNITY)
Admission: EM | Admit: 2020-04-14 | Discharge: 2020-04-15 | Disposition: A | Discharge status: Home / Self Care | Payer: 59 | Attending: Emergency Medicine | Admitting: Emergency Medicine

## 2020-04-14 ENCOUNTER — Encounter (HOSPITAL_COMMUNITY): Payer: Self-pay

## 2020-04-14 ENCOUNTER — Other Ambulatory Visit: Payer: Self-pay

## 2020-04-14 DIAGNOSIS — M5442 Lumbago with sciatica, left side: Secondary | ICD-10-CM | POA: Insufficient documentation

## 2020-04-14 DIAGNOSIS — M62830 Muscle spasm of back: Secondary | ICD-10-CM | POA: Insufficient documentation

## 2020-04-14 DIAGNOSIS — M5432 Sciatica, left side: Secondary | ICD-10-CM

## 2020-04-14 DIAGNOSIS — Z87891 Personal history of nicotine dependence: Secondary | ICD-10-CM | POA: Insufficient documentation

## 2020-04-14 LAB — URINALYSIS, ROUTINE W REFLEX MICROSCOPIC
Bilirubin Urine: NEGATIVE
Glucose, UA: NEGATIVE mg/dL
Hgb urine dipstick: NEGATIVE
Ketones, ur: 5 mg/dL — AB
Leukocytes,Ua: NEGATIVE
Nitrite: NEGATIVE
Protein, ur: NEGATIVE mg/dL
Specific Gravity, Urine: 1.014 (ref 1.005–1.030)
pH: 7 (ref 5.0–8.0)

## 2020-04-14 MED ORDER — OXYCODONE-ACETAMINOPHEN 5-325 MG PO TABS
1.0000 | ORAL_TABLET | Freq: Once | ORAL | Status: AC
  Administered 2020-04-14: 1 via ORAL
  Filled 2020-04-14: qty 1

## 2020-04-14 NOTE — ED Triage Notes (Signed)
Pt reports left lower back pain since yesterday, hx of pinched nerve. Some numbness in his left leg. Denies any bowel incontinence. Pt a.o, laying in recliner in triage

## 2020-04-15 MED ORDER — LIDOCAINE 5 % EX PTCH: 1.0000 | MEDICATED_PATCH | CUTANEOUS | 0 refills | Status: DC

## 2020-04-15 MED ORDER — CYCLOBENZAPRINE HCL 10 MG PO TABS: 10.0000 mg | ORAL_TABLET | Freq: Three times a day (TID) | ORAL | 0 refills | Status: DC | PRN

## 2020-04-15 MED ORDER — SODIUM CHLORIDE 0.9 % IV BOLUS
1000.0000 mL | Freq: Once | INTRAVENOUS | Status: AC
  Administered 2020-04-15: 1000 mL via INTRAVENOUS

## 2020-04-15 MED ORDER — HYDROMORPHONE HCL 1 MG/ML IJ SOLN
1.0000 mg | Freq: Once | INTRAMUSCULAR | Status: AC
  Administered 2020-04-15: 1 mg via INTRAVENOUS
  Filled 2020-04-15: qty 1

## 2020-04-15 MED ORDER — KETOROLAC TROMETHAMINE 30 MG/ML IJ SOLN
30.0000 mg | Freq: Once | INTRAMUSCULAR | Status: AC
  Administered 2020-04-15: 30 mg via INTRAVENOUS
  Filled 2020-04-15: qty 1

## 2020-04-15 MED ORDER — DIAZEPAM 5 MG PO TABS
5.0000 mg | ORAL_TABLET | Freq: Once | ORAL | Status: AC
  Administered 2020-04-15: 5 mg via ORAL
  Filled 2020-04-15: qty 1

## 2020-04-15 NOTE — ED Notes (Addendum)
Pt had a suspected vagal response to IV insertion.  Pulse dropped to 40, bp 90/62,  pt became very diaphoretic and pale,.  Provider aware, approved NS bolus.

## 2020-04-15 NOTE — ED Provider Notes (Signed)
[facility] EMERGENCY DEPARTMENT Provider Note   CSN: [id number] Arrival date & time: 04/14/20  1850     History Chief Complaint  Patient presents with  . Back Pain    Anthony Harris is a 44 y.o. male.  Patient presents to the emergency department with a chief complaint of low back pain.  He has history of sciatica.  Has had lumbar disc herniation in the past with surgery several years ago.  He denies any fevers or chills.  Denies bowel or bladder incontinence.  Denies any history of cancer in himself.  Denies IV drug use.  States that he has numbness and burning sensation down the left leg.  The majority of his back pain is on the left side of his low back.  Has tried stretching, ice, heat, and OTC meds without any relief.  The history is provided by the patient. No language interpreter was used.       Past Medical History:  Diagnosis Date  . Anxiety   . Arthritis   . Attention deficit disorder   . GERD (gastroesophageal reflux disease)   . Headache(784.0)   . Lumbar disc herniation     Patient Active Problem List   Diagnosis Date Noted  . Right inguinal hernia 07/14/2013  . Muscle spasm of back 06/21/2012  . Lumbar disc herniation 06/21/2012    Past Surgical History:  Procedure Laterality Date  . HERNIA REPAIR    . INGUINAL HERNIA REPAIR Right 07/16/2013   Procedure: HERNIA REPAIR INGUINAL ADULT;  Surgeon: Ernestene Mention, MD;  Location: [facility] OR;  Service: General;  Laterality: Right;  . INSERTION OF MESH Right 07/16/2013   Procedure: INSERTION OF MESH;  Surgeon: Ernestene Mention, MD;  Location: [facility];  Service: General;  Laterality: Right;  . LUMBAR EPIDURAL INJECTION     Hx; of for pain  . TONSILLECTOMY         Family History  Problem Relation Age of Onset  . Stroke Father   . Hypercholesterolemia Father   . Diabetes Sister   . Diabetes Brother   . Hypertension Other   . Cancer - Lung Other     Social History   Tobacco Use  .  Smoking status: Former Smoker    Types: Cigarettes  . Smokeless tobacco: Never Used  Substance Use Topics  . Alcohol use: Yes    Comment: Social drinking  . Drug use: No    Comment: No active drug use. History of past drug use.    Home Medications Prior to Admission medications   Medication Sig Start Date End Date Taking? Authorizing Provider  esomeprazole (NEXIUM 24HR) 20 MG capsule Take 20 mg by mouth daily.    [provider]  ibuprofen (ADVIL,MOTRIN) 200 MG tablet Take 800 mg by mouth every 6 (six) hours as needed (for pain or headaches).    [provider]  levofloxacin (LEVAQUIN) 500 MG tablet Take 1 tablet (500 mg total) by mouth daily. 02/04/18   Mancel Bale, MD  methocarbamol (ROBAXIN) 500 MG tablet Take 1 tablet (500 mg total) by mouth every 6 (six) hours as needed for muscle spasms. Patient not taking: Reported on 02/04/2018 04/26/14   Azalia Bilis, MD  oxyCODONE-acetaminophen (PERCOCET) 5-325 MG tablet Take 1 tablet by mouth every 4 (four) hours as needed for severe pain. 02/04/18   Mancel Bale, MD  ranitidine (ZANTAC) 150 MG tablet Take 150 mg by mouth daily.     [provider]  Allergies    Patient has no allergy information on record.  Review of Systems   Review of Systems  All other systems reviewed and are negative.   Physical Exam Updated Vital Signs BP (!) 126/93   Pulse 78   Temp 98 F (36.7 C) (Oral)   Resp 16   Ht 5\' 10"  (1.778 m)   Wt 102.1 kg   SpO2 98%   BMI 32.28 kg/m   Physical Exam Physical Exam  Constitutional: Pt appears well-developed and well-nourished. No distress.  HENT:  Head: Normocephalic and atraumatic.  Mouth/Throat: Oropharynx is clear and moist. No oropharyngeal exudate.  Eyes: Conjunctivae are normal.  Neck: Normal range of motion. Neck supple.  No meningismus Cardiovascular: Normal rate, regular rhythm and intact distal pulses.   Pulmonary/Chest: Effort normal and breath sounds normal. No  respiratory distress. Pt has no wheezes.  Abdominal: Pt exhibits no distension Musculoskeletal:  Left lumbar paraspinal muscles tender to palpation, no bony CTLS spine tenderness, deformity, step-off, or crepitus Lymphadenopathy: Pt has no cervical adenopathy.  Neurological: Pt is alert and oriented Speech is clear and goal oriented, follows commands Normal 5/5 strength in upper and lower extremities bilaterally including dorsiflexion and plantar flexion, strong and equal grip strength Sensation intact Great toe extension intact Moves extremities without ataxia, coordination intact Ankle and knee jerk reflexes intact and symmetrical  Normal gait Normal balance No Clonus Skin: Skin is warm and dry. No rash noted. Pt is not diaphoretic. No erythema.  Psychiatric: Pt has a normal mood and affect. Behavior is normal.  Nursing note and vitals reviewed. ED Results / Procedures / Treatments   Labs (all labs ordered are listed, but only abnormal results are displayed) Labs Reviewed  URINALYSIS, ROUTINE W REFLEX MICROSCOPIC - Abnormal; Notable for the following components:      Result Value   APPearance HAZY (*)    Ketones, ur 5 (*)    All other components within normal limits    EKG None  Radiology No results found.  Procedures Procedures (including critical care time)  Medications Ordered in ED Medications  diazepam (VALIUM) tablet 5 mg (has no administration in time range)  HYDROmorphone (DILAUDID) injection 1 mg (has no administration in time range)  ketorolac (TORADOL) 30 MG/ML injection 30 mg (has no administration in time range)  oxyCODONE-acetaminophen (PERCOCET/ROXICET) 5-325 MG per tablet 1 tablet (1 tablet Oral Given 04/14/20 1902)    ED Course  I have reviewed the triage vital signs and the nursing notes.  Pertinent labs & imaging results that were available during my care of the patient were reviewed by me and considered in my medical decision making (see chart for  details).    MDM Rules/Calculators/A&P                          Patient with back pain.    No neurological deficits and normal neuro exam.  Patient is ambulatory.  No loss of bowel or bladder control.  Doubt cauda equina.  Denies fever,  doubt epidural abscess or other lesion. Recommend back exercises, stretching, RICE, and will treat with a short course of flexeril.  Consultants: None  Review of prior charts show no frequent visits for back pain.  Encouraged the patient that there could be a need for additional workup and/or imaging such as MRI, if the symptoms do not resolve. Patient advised that if the back pain does not resolve, or radiates, this could progress to more  serious conditions and is encouraged to follow-up with PCP or orthopedics within 2 weeks.    Final Clinical Impression(s) / ED Diagnoses Final diagnoses:  Muscle spasm of back  Sciatica of left side    Rx / DC Orders ED Discharge Orders    None       Roxy Horseman, PA-C 04/15/20 0524    Shon Baton, MD 04/15/20 [id number]

## 2020-05-12 ENCOUNTER — Encounter: Payer: Self-pay | Admitting: Physician Assistant

## 2020-05-12 ENCOUNTER — Ambulatory Visit: Payer: 59 | Admitting: Physician Assistant

## 2020-05-12 VITALS — BP 120/90 | HR 85 | Temp 98.2°F | Resp 16 | Ht 71.0 in | Wt 237.0 lb

## 2020-05-12 DIAGNOSIS — M5417 Radiculopathy, lumbosacral region: Secondary | ICD-10-CM

## 2020-05-12 DIAGNOSIS — IMO0002 Reserved for concepts with insufficient information to code with codable children: Secondary | ICD-10-CM | POA: Insufficient documentation

## 2020-05-12 MED ORDER — METHYLPREDNISOLONE ACETATE 80 MG/ML IJ SUSP: 80.0000 mg | Freq: Once | INTRAMUSCULAR | Status: DC

## 2020-05-12 MED ORDER — METHYLPREDNISOLONE ACETATE 80 MG/ML IJ SUSP
80.0000 mg | Freq: Once | INTRAMUSCULAR | Status: DC
  Administered 2020-05-12: 80 mg via INTRAMUSCULAR

## 2020-05-12 MED ORDER — CYCLOBENZAPRINE HCL 10 MG PO TABS: 10.0000 mg | ORAL_TABLET | Freq: Every day | ORAL | 0 refills | Status: DC

## 2020-05-12 MED ORDER — METHYLPREDNISOLONE 4 MG PO TBPK: ORAL_TABLET | ORAL | 0 refills | Status: DC

## 2020-05-12 MED ORDER — TRAMADOL HCL 50 MG PO TABS: 50.0000 mg | ORAL_TABLET | Freq: Three times a day (TID) | ORAL | 0 refills | Status: AC | PRN

## 2020-05-12 NOTE — Progress Notes (Signed)
Patient presents to clinic today to establish care and to discuss ongoing and worsening back pain. Patient endorses remote history of lumbar disc herniation at L4/5 status post surgical correction with neurosurgery in 2017. No records available for review. States at that time he was told he had another disc herniations at the sacral level that were mild. No intervention at that time. Meds has been doing very well overall up until the past month. Denies any trauma or injury. Started having significant back pain, mainly left-sided with radiation down the left leg. Was evaluated in the emergency room 3 weeks ago at which time he was given pain medicine and muscle relaxer and encouraged to follow-up with primary care neurosurgery. Endorses taking muscle relaxer as directed and notes substantial improvement in his spasms. Pain is continued and is now radiating into his groin. Denies any saddle anesthesia or change to bowel or bladder habits. Does have chronic numbness of distal left lower extremity since his surgery. Denies symptoms of right side. Is wanting to get back in with a specialist.   Past Medical History:  Diagnosis Date  . Anxiety   . Attention deficit disorder    childhood  . GERD (gastroesophageal reflux disease)   . Headache(784.0)   . Lumbar disc herniation     Current Outpatient Medications on File Prior to Visit  Medication Sig Dispense Refill  . omeprazole (PRILOSEC OTC) 20 MG tablet Take 20 mg by mouth daily.     No current facility-administered medications on file prior to visit.    No Known Allergies  Family History  Problem Relation Age of Onset  . Stroke Father   . Hypercholesterolemia Father   . Diabetes Sister   . Diabetes Brother   . Hypertension Other   . Cancer - Lung Other     Social History   Socioeconomic History  . Marital status: Married    Spouse name: Not on file  . Number of children: Not on file  . Years of education: Not on file  . Highest  education level: Not on file  Occupational History  . Not on file  Tobacco Use  . Smoking status: Former Smoker    Types: Cigarettes  . Smokeless tobacco: Never Used  . Tobacco comment: at age 53  Vaping Use  . Vaping Use: Never used  Substance and Sexual Activity  . Alcohol use: Yes    Comment: Social drinking  . Drug use: Not Currently    Comment: No active drug use. History of past drug use.  Marland Kitchen Sexual activity: Yes  Other Topics Concern  . Not on file  Social History Narrative   Patient lives in [place] with his wife.   Had education up to 10th grade.   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   Has 3 kids. All healthy.         Social Determinants of Health   Financial Resource Strain:   . Difficulty of Paying Living Expenses: Not on file  Food Insecurity:   . Worried About Programme researcher, broadcasting/film/video in the Last Year: Not on file  . Ran Out of Food in the Last Year: Not on file  Transportation Needs:   . Lack of Transportation (Medical): Not on file  . Lack of Transportation (Non-Medical): Not on file  Physical Activity:   . Days of Exercise per Week: Not on file  . Minutes of Exercise per Session: Not on file  Stress:   .  Feeling of Stress : Not on file  Social Connections:   . Frequency of Communication with Friends and Family: Not on file  . Frequency of Social Gatherings with Friends and Family: Not on file  . Attends Religious Services: Not on file  . Active Member of Clubs or Organizations: Not on file  . Attends Banker Meetings: Not on file  . Marital Status: Not on file   Review of Systems - See HPI.  All other ROS are negative.  BP 120/90   Pulse 85   Temp 98.2 F (36.8 C) (Temporal)   Resp 16   Ht 5\' 11"  (1.803 m)   Wt 237 lb (107.5 kg)   SpO2 99%   BMI 33.05 kg/m   Physical Exam Vitals reviewed.  Constitutional:      Appearance: Normal appearance.  HENT:     Head: Normocephalic and atraumatic.    Cardiovascular:     Rate and Rhythm: Normal rate and regular rhythm.     Pulses: Normal pulses.     Heart sounds: Normal heart sounds.  Pulmonary:     Effort: Pulmonary effort is normal.     Breath sounds: Normal breath sounds.  Musculoskeletal:     Cervical back: Neck supple.     Lumbar back: Spasms present. No swelling, edema, tenderness or bony tenderness. No scoliosis.     Right hip: Normal. Normal strength.     Left hip: Normal. Normal strength.     Right upper leg: No swelling, edema, tenderness or bony tenderness.     Left upper leg: No swelling, edema, tenderness or bony tenderness.  Neurological:     General: No focal deficit present.     Mental Status: He is alert and oriented to person, place, and time.  Psychiatric:        Mood and Affect: Mood normal.    Recent Results (from the past 2160 hour(s))  Urinalysis, Routine w reflex microscopic Urine, Clean Catch     Status: Abnormal   Collection Time: 04/14/20  9:09 PM  Result Value Ref Range   Color, Urine YELLOW YELLOW   APPearance HAZY (A) CLEAR   Specific Gravity, Urine 1.014 1.005 - 1.030   pH 7.0 5.0 - 8.0   Glucose, UA NEGATIVE NEGATIVE mg/dL   Hgb urine dipstick NEGATIVE NEGATIVE   Bilirubin Urine NEGATIVE NEGATIVE   Ketones, ur 5 (A) NEGATIVE mg/dL   Protein, ur NEGATIVE NEGATIVE mg/dL   Nitrite NEGATIVE NEGATIVE   Leukocytes,Ua NEGATIVE NEGATIVE    Comment: Performed at [facility], [address], [place], [place]    Assessment/Plan: 1. Lumbosacral radiculopathy No saddle anesthesia or change to bowel or bladder habits. No trauma noted. Needs further evaluation and management. We will proceed with x-ray of lumbar spine to assess. This will also help [id number] get approval for MRI and be beneficial to neurosurgery. Depo-Medrol IM given today in office. He will start Medrol Dosepak tomorrow. Cyclobenzaprine refilled. Rx tramadol given for moderate to severe pain, transitioning to Tylenol for minor  pain. Strict ER precautions reviewed with patient and wife who voiced understanding and agreement with plan. Referral to neurosurgery placed. - Ambulatory referral to Neurosurgery - DG Lumbar Spine Complete; Future - methylPREDNISolone (MEDROL DOSEPAK) 4 MG TBPK tablet; Take following package directions  Dispense: 21 tablet; Refill: 0 - cyclobenzaprine (FLEXERIL) 10 MG tablet; Take 1 tablet (10 mg total) by mouth at bedtime.  Dispense: 30 tablet; Refill: 0 - traMADol (ULTRAM) 50  MG tablet; Take 1 tablet (50 mg total) by mouth every 8 (eight) hours as needed for up to 5 days.  Dispense: 15 tablet; Refill: 0 - methylPREDNISolone acetate (DEPO-MEDROL) injection 80 mg  This visit occurred during the SARS-CoV-2 public health emergency.  Safety protocols were in place, including screening questions prior to the visit, additional usage of staff PPE, and extensive cleaning of exam room while observing appropriate contact time as indicated for disinfecting solutions.     Piedad Climes, PA-C

## 2020-05-12 NOTE — Patient Instructions (Signed)
The steroid injection given today should help kick-start improvement.  Start the Medrol dose pack tomorrow, taking as directed.  Use the Tramadol as directed, if needed for severe pain. Tylenol instead for milder pain. Use the Flexeril as directed.  I am working on getting you in with Neurosurgery ASAP.  They will want x-ray to review and to help with insurance approval for MRI.  Please speak with front desk about going for your x-ray.  If you note any acutely worsening symptoms, inability to use the bathroom or if you have any numbness in the groin, you need to be seen ASAP in the ER.

## 2020-08-27 ENCOUNTER — Telehealth: Payer: Self-pay | Admitting: Physician Assistant

## 2020-08-27 DIAGNOSIS — M5417 Radiculopathy, lumbosacral region: Secondary | ICD-10-CM

## 2020-08-27 NOTE — Telephone Encounter (Signed)
In reviewing patient chart, his OV was 05/12/20 for lumbosacral radiculopathy. He was given steroid injection, Flexeril and Tramadol and a referral was placed to Neurosurgery but patient did not schedule. Now he is not working.  Please advise that patient would need another appointment

## 2020-08-27 NOTE — Telephone Encounter (Signed)
..  Medication Refills  Last OV:  Medication:  cyclobensaprine & tramadol  Pharmacy:  [address], [place]  Let patient know to contact pharmacy at the end of the day to make sure medication is ready.   Please notify patient to allow 48-72 hours to process.  Encourage patient to contact the pharmacy for refills or they can request refills through [facility]  Clinical Fills out below:   Last refill:  QTY:  Refill Date:    Other Comments:  Patient lost his job and has not gotten to go to his referral yet.  He needs the tramadol and muscle    Okay for refill?  Please advise.

## 2020-08-31 NOTE — Telephone Encounter (Signed)
Sorry for the delay. Spoke with patient wife, since he doesn't have insurance and is not working. He did not go to Neurosurgery or get the xray or MRI completed. His back did improve with the medications. They has stretched the medicine until now.  He did start having back pain again about 1 week ago. Requesting refill of medication to help.  Advised may need an appointment for further evaluation. She is understandable but didn't want to due to not having insurance and unknown amount for OV.   Please advise

## 2020-09-01 MED ORDER — CYCLOBENZAPRINE HCL 10 MG PO TABS: 10.0000 mg | ORAL_TABLET | Freq: Every day | ORAL | 0 refills | Status: DC

## 2020-09-01 MED ORDER — TRAMADOL HCL 50 MG PO TABS: 50.0000 mg | ORAL_TABLET | Freq: Three times a day (TID) | ORAL | 0 refills | Status: AC | PRN

## 2020-09-01 NOTE — Addendum Note (Signed)
Addended by: Worthy Rancher B on: 09/01/2020 12:56 PM   Modules accepted: Orders

## 2020-09-01 NOTE — Telephone Encounter (Signed)
Left a detailed message on VM of medications were sent to the pharmacy. If symptoms has not improved will need to schedule an office visit within the next 30 days.

## 2022-02-01 ENCOUNTER — Encounter: Payer: Self-pay | Admitting: Family Medicine

## 2022-02-01 ENCOUNTER — Ambulatory Visit (INDEPENDENT_AMBULATORY_CARE_PROVIDER_SITE_OTHER): Payer: BC Managed Care – PPO | Admitting: Family Medicine

## 2022-02-01 VITALS — BP 132/84 | HR 75 | Temp 97.6°F | Ht 69.0 in | Wt 240.8 lb

## 2022-02-01 DIAGNOSIS — R0681 Apnea, not elsewhere classified: Secondary | ICD-10-CM | POA: Diagnosis not present

## 2022-02-01 DIAGNOSIS — R200 Anesthesia of skin: Secondary | ICD-10-CM | POA: Insufficient documentation

## 2022-02-01 DIAGNOSIS — F419 Anxiety disorder, unspecified: Secondary | ICD-10-CM

## 2022-02-01 DIAGNOSIS — F339 Major depressive disorder, recurrent, unspecified: Secondary | ICD-10-CM | POA: Insufficient documentation

## 2022-02-01 DIAGNOSIS — M5126 Other intervertebral disc displacement, lumbar region: Secondary | ICD-10-CM | POA: Diagnosis not present

## 2022-02-01 NOTE — Assessment & Plan Note (Signed)
Elevated GAD-7 Records from prior PCP to gauge which medicines been tried, patient is poor historian Follow-up 1 month

## 2022-02-01 NOTE — Assessment & Plan Note (Signed)
Given extensive history Referral to to orthopedics

## 2022-02-01 NOTE — Assessment & Plan Note (Signed)
Elevated PHQ-9 No SI or HI Request for prior records from previous PCP Follow-up 1 month

## 2022-02-01 NOTE — Progress Notes (Signed)
Assessment/Plan:  Spent 60 minutes reviewing patient's chart, discussing treatments At today's visit, we discussed treatment options, associated risk and benefits, and engage in counseling as needed.  Additionally the following were reviewed: Past medical records, past medical and surgical history, family and social background, as well as relevant laboratory results, imaging findings, and specialty notes, where applicable.  This message was generated using dictation software, and as a result, it may contain unintentional typos or errors.  Nevertheless, extensive effort was made to accurately convey at the pertinent aspects of the patient visit.    There may have been are other unrelated non-urgent complaints, but due to the busy schedule and the amount of time already spent with him, time does not permit to address these issues at today's visit. Another appointment may have or has been requested to review these additional issues.  Problem List Items Addressed This Visit       Musculoskeletal and Integument   Lumbar disc herniation    Given extensive history Referral to to orthopedics      Relevant Orders   Ambulatory referral to Orthopedics     Other   Depression, recurrent (HCC)    Elevated PHQ-9 No SI or HI Request for prior records from previous PCP Follow-up 1 month      Anxiety    Elevated GAD-7 Records from prior PCP to gauge which medicines been tried, patient is poor historian Follow-up 1 month      Numbness of left foot    Bilateral foot numbness with pain Worse when standing Possibly associated with lumbar disease, although has not had any recent imaging Patient is following up with orthopedics per referral Recommend patient also get work-up for peripheral neuropathy including screening for hemoglobin A1c, B12, x-ray of bilateral feet, can consider SPEP/UPEP if these are negative      Numbness of right foot   Other Visit Diagnoses     Apnea    -  Primary    Relevant Orders   Ambulatory referral to Sleep Studies          Subjective:  HPI:  Anthony Harris is a 46 y.o. male who has Lumbar disc herniation; HNP (herniated nucleus pulposus); Depression, recurrent (HCC); Anxiety; Numbness of left foot; and Numbness of right foot on their problem list..   He  has a past medical history of Anxiety, Attention deficit disorder, GERD (gastroesophageal reflux disease), Headache(784.0), Lumbar disc herniation, Muscle spasm of back (06/21/2012), and Right inguinal hernia (07/14/2013).Marland Kitchen   He presents with chief complaint of Numbness (Numbness in right and left in the ball of foot and toes. Would like to discuss colonoscopy options. ) .   Neuropathy: He describes symptoms of numbness. Onset of symptoms was gradual, starting about 3 months ago. Symptoms are currently of moderate severity. Symptoms occur all day and last months. The patient denies burning, lancinating pain, tingling, and cramping. Symptoms are worse in the lower extremities and in bilateral legs, chronic numbness in left leg, but new bilateral numbness in feet. He also describes autonomic symptoms of  none. He denies  orthostasis, bloating, nausea, early satiety, diarrhea, constipation, alternating diarrhea and constipation, dry eyes, dry mouth, dry eyes and dry mouth, blurred vision, lack of sweating, and sexual dysfunction. Previous treatment has included gabapentin in 2015, which had improved symptoms.  MRI from 2015 showed L4 disc disease with some left nerve root inflammation.  No records seen for surgery, there was one scheduled 2015, but appears to have been canceled.  Patient with history of orchitis found on imaging in 2019.  Denies any GU or testicular symptoms. BMP from 2019 unremarkable with normal creatinine and electrolytes.  CBC from 2019 showed significant leukocytosis at 23.  There are no other associated notes with this.  In this chart  Back Pain  He reports chronic back  pain. There was an injury that may have caused the pain. The most recent episode started  10 years ago and is staying constant. The pain is located in the right lumbar area or radiating to left leg(s)to the left foot. It is described as aching, is moderate in intensity, occurring constantly. Symptoms are worse in the: all day  Aggravating factors: bending backwards, bending forwards, bending sideways, recumbency, sitting, and standing Relieving factors: sitting.  He has tried application of heat, application of ice, and NSAIDs with mild relief.   Associated symptoms: No abdominal pain No bowel incontinence  No chest pain No dysuria   No fever No headaches  No joint pains No pelvic pain  No weakness in leg  No tingling in lower extremities  No urinary incontinence No weight loss   Reports prior surgery in 2017 L4/L5. Previously [facility] at [facility], Dr. Yetta Barre prior surgery, has also had epidural  ----------------------------------------------------------------------------------------- Depression: Patient complains of depression. He complains of depressed mood. Onset was approximately several years ago, unchanged since that time.  He denies current suicidal and homicidal plan or intent.   Family history significant for alcoholism, anxiety, depression, and substance abuse.Possible organic causes contributing are: neuro.  Risk factors: positive family history in  brother(s) and father and previous episode of depression Previous treatment includes  tried multiple, Ritalin, Adderal, Zoloft, others, can't remember  and individual therapy and medication. He complains of the following side effects from the treatment:  affecting mood, making feeling "funny" .  Anxiety: Patient complains of anxiety disorder.  He has the following symptoms: difficulty concentrating, insomnia, irritable, racing thoughts. Onset of symptoms was approximately several years ago, unchanged since that time. He denies current suicidal  and homicidal ideation. Family history significant for alcoholism, anxiety, and depression.Possible organic causes contributing are: none. Risk factors: positive family history in  brother(s) and father and previous episode of depression Previous treatment includes Zoloft and ritalin  and individual therapy.  He complains of the following side effects from the treatment: none.  Also reports insomnia and snoring, seen apnic by wife.  STOP-BANG 5  Past Surgical History:  Procedure Laterality Date   HERNIA REPAIR     INGUINAL HERNIA REPAIR Right 07/16/2013   Procedure: HERNIA REPAIR INGUINAL ADULT;  Surgeon: Ernestene Mention, MD;  Location: [facility] OR;  Service: General;  Laterality: Right;   INSERTION OF MESH Right 07/16/2013   Procedure: INSERTION OF MESH;  Surgeon: Ernestene Mention, MD;  Location: [facility] OR;  Service: General;  Laterality: Right;   LUMBAR EPIDURAL INJECTION     Hx; of for pain   TONSILLECTOMY      Outpatient Medications Prior to Visit  Medication Sig Dispense Refill   Omeprazole Magnesium (PRILOSEC OTC PO) Take by mouth.     cyclobenzaprine (FLEXERIL) 10 MG tablet Take 1 tablet (10 mg total) by mouth at bedtime. (Patient not taking: Reported on 02/01/2022) 30 tablet 0   methylPREDNISolone (MEDROL DOSEPAK) 4 MG TBPK tablet Take following package directions (Patient not taking: Reported on 02/01/2022) 21 tablet 0   omeprazole (PRILOSEC OTC) 20 MG tablet Take 20 mg by mouth daily. (Patient not taking: Reported on  02/01/2022)     methylPREDNISolone acetate (DEPO-MEDROL) injection 80 mg      No facility-administered medications prior to visit.    Family History  Problem Relation Age of Onset   Stroke Father    Hypercholesterolemia Father    Diabetes Sister    Diabetes Brother    Hypertension Other    Cancer - Lung Other     Social History   Socioeconomic History   Marital status: Married    Spouse name: Not on file   Number of children: Not on file   Years of education: Not on  file   Highest education level: Not on file  Occupational History   Not on file  Tobacco Use   Smoking status: Former    Types: Cigarettes   Smokeless tobacco: Never   Tobacco comments:    at age 61  Vaping Use   Vaping Use: Never used  Substance and Sexual Activity   Alcohol use: Yes    Comment: Social drinking   Drug use: Not Currently    Comment: No active drug use. History of past drug use.   Sexual activity: Yes  Other Topics Concern   Not on file  Social History Narrative   Patient lives in [place] with his wife.   Had education up to 10th grade.   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   Has 3 kids. All healthy.         Social Determinants of Health   Financial Resource Strain: Not on file  Food Insecurity: Not on file  Transportation Needs: Not on file  Physical Activity: Not on file  Stress: Not on file  Social Connections: Not on file  Intimate Partner Violence: Not on file                                                                                                 Objective:  Physical Exam: BP 132/84 (BP Location: Left Arm, Patient Position: Sitting, Cuff Size: Large)   Pulse 75   Temp 97.6 F (36.4 C) (Temporal)   Ht 5\' 9"  (1.753 m)   Wt 240 lb 12.8 oz (109.2 kg)   SpO2 98%   BMI 35.56 kg/m    General: No acute distress. Awake and conversant.  Eyes: Normal conjunctiva, anicteric. Round symmetric pupils.  ENT: Hearing grossly intact. No nasal discharge.  Neck: Neck is supple. No masses or thyromegaly.  Respiratory: Respirations are non-labored. No auditory wheezing.  Skin: Warm. No rashes or ulcers.  Psych: Alert and oriented. Cooperative, Appropriate mood and affect, Normal judgment.  CV: No cyanosis or JVD MSK: Normal ambulation. No clubbing, 5 of 5 strength throughout bilateral lower extremities, right lumbar back tender Neuro: CN II-XII grossly normal.        , MD, MS

## 2022-02-01 NOTE — Patient Instructions (Addendum)
We are referring to sleep study. We are referring to Orthopedics. Please have old medical records sent to office

## 2022-02-01 NOTE — Assessment & Plan Note (Signed)
Bilateral foot numbness with pain Worse when standing Possibly associated with lumbar disease, although has not had any recent imaging Patient is following up with orthopedics per referral Recommend patient also get work-up for peripheral neuropathy including screening for hemoglobin A1c, B12, x-ray of bilateral feet, can consider SPEP/UPEP if these are negative

## 2022-02-06 DIAGNOSIS — M5451 Vertebrogenic low back pain: Secondary | ICD-10-CM | POA: Diagnosis not present

## 2022-02-16 DIAGNOSIS — M545 Low back pain, unspecified: Secondary | ICD-10-CM | POA: Diagnosis not present

## 2022-02-21 DIAGNOSIS — M5416 Radiculopathy, lumbar region: Secondary | ICD-10-CM | POA: Diagnosis not present

## 2022-03-01 ENCOUNTER — Encounter: Payer: Self-pay | Admitting: Family Medicine

## 2022-03-01 ENCOUNTER — Ambulatory Visit (INDEPENDENT_AMBULATORY_CARE_PROVIDER_SITE_OTHER): Payer: BC Managed Care – PPO | Admitting: Family Medicine

## 2022-03-01 VITALS — BP 130/84 | HR 79 | Temp 97.8°F | Wt 242.6 lb

## 2022-03-01 DIAGNOSIS — L309 Dermatitis, unspecified: Secondary | ICD-10-CM

## 2022-03-01 DIAGNOSIS — F419 Anxiety disorder, unspecified: Secondary | ICD-10-CM

## 2022-03-01 MED ORDER — HYDROXYZINE HCL 50 MG PO TABS: 25.0000 mg | ORAL_TABLET | Freq: Three times a day (TID) | ORAL | 0 refills | Status: DC | PRN

## 2022-03-01 MED ORDER — TRIAMCINOLONE ACETONIDE 0.5 % EX OINT: 1.0000 | TOPICAL_OINTMENT | Freq: Two times a day (BID) | CUTANEOUS | 3 refills | Status: DC

## 2022-03-01 NOTE — Assessment & Plan Note (Signed)
On elbows Trial Kenalog

## 2022-03-01 NOTE — Progress Notes (Signed)
Assessment/Plan:  At today's visit, we discussed treatment options, associated risk and benefits, and engage in counseling as needed.  Additionally the following were reviewed: Past medical records, past medical and surgical history, family and social background, as well as relevant laboratory results, imaging findings, and specialty notes, where applicable.  This message was generated using dictation software, and as a result, it may contain unintentional typos or errors.  Nevertheless, extensive effort was made to accurately convey at the pertinent aspects of the patient visit.    There may have been are other unrelated non-urgent complaints, but due to the busy schedule and the amount of time already spent with him, time does not permit to address these issues at today's visit. Another appointment may have or has been requested to review these additional issues.  Problem List Items Addressed This Visit       Musculoskeletal and Integument   Eczema    On elbows Trial Kenalog      Relevant Medications   triamcinolone ointment (KENALOG) 0.5 %     Other   Anxiety - Primary    Associated w/ depression No SI/HI Only wants something PRN Trial atarax 25-50 mg qid prn  Sedation side effect discussed      Relevant Medications   hydrOXYzine (ATARAX) 50 MG tablet       Subjective:  HPI:  Anthony Harris is a 46 y.o. male who has Lumbar disc herniation; HNP (herniated nucleus pulposus); Depression, recurrent (HCC); Anxiety; Numbness of left foot; Numbness of right foot; and Eczema on their problem list..   He  has a past medical history of Anxiety, Attention deficit disorder, GERD (gastroesophageal reflux disease), Headache(784.0), Lumbar disc herniation, Muscle spasm of back (06/21/2012), and Right inguinal hernia (07/14/2013).Marland Kitchen   He presents with chief complaint of Follow-up (4 week follow up PHQ/GAD and foot numbness. Patient states that foot numbness is still present and is  getting an epidural shot.Rash on right and left arms. Colonoscopy options requested. ) .   Depression and Anxiety, established problem, uncontrolled Current Medications: none Side Effects:  Current Symptoms/Interim History: Wants to take a medication on prn basis, concerned of daily mediation that may make him feel withdrawal if he misses it     03/01/2022    3:34 PM  Depression screen PHQ 2/9  Decreased Interest 3  Down, Depressed, Hopeless 2  PHQ - 2 Score 5  Altered sleeping 3  Tired, decreased energy 3  Change in appetite 2  Feeling bad or failure about yourself  3  Trouble concentrating 2  Moving slowly or fidgety/restless 3  Suicidal thoughts 0  PHQ-9 Score 21  Difficult doing work/chores Somewhat difficult       03/01/2022    3:34 PM  GAD 7 : Generalized Anxiety Score  Nervous, Anxious, on Edge 3  Control/stop worrying 3  Worry too much - different things 3  Trouble relaxing 3  Restless 3  Easily annoyed or irritable 3  Afraid - awful might happen 0  Total GAD 7 Score 18  Anxiety Difficulty Somewhat difficult    ROS: No SI or HI.  Patient complains of rash on arms. It has been there several years. It is pruitic. He has tried lotion, but no improvement. There are no fevers, drainage, pain. It is not worsening.    Past Surgical History:  Procedure Laterality Date   HERNIA REPAIR     INGUINAL HERNIA REPAIR Right 07/16/2013   Procedure: HERNIA REPAIR INGUINAL ADULT;  Surgeon: Ernestene Mention, MD;  Location: [facility] OR;  Service: General;  Laterality: Right;   INSERTION OF MESH Right 07/16/2013   Procedure: INSERTION OF MESH;  Surgeon: Ernestene Mention, MD;  Location: [facility] OR;  Service: General;  Laterality: Right;   LUMBAR EPIDURAL INJECTION     Hx; of for pain   TONSILLECTOMY      Outpatient Medications Prior to Visit  Medication Sig Dispense Refill   Omeprazole Magnesium (PRILOSEC OTC PO) Take by mouth.     No facility-administered medications prior to visit.     Family History  Problem Relation Age of Onset   Stroke Father    Hypercholesterolemia Father    Diabetes Sister    Diabetes Brother    Hypertension Other    Cancer - Lung Other     Social History   Socioeconomic History   Marital status: Married    Spouse name: Not on file   Number of children: Not on file   Years of education: Not on file   Highest education level: Not on file  Occupational History   Not on file  Tobacco Use   Smoking status: Former    Types: Cigarettes   Smokeless tobacco: Never   Tobacco comments:    at age 20  Vaping Use   Vaping Use: Never used  Substance and Sexual Activity   Alcohol use: Yes    Comment: Social drinking   Drug use: Not Currently    Comment: No active drug use. History of past drug use.   Sexual activity: Yes  Other Topics Concern   Not on file  Social History Narrative   Patient lives in [place] with his wife.   Had education up to 10th grade.   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   Has 3 kids. All healthy.         Social Determinants of Health   Financial Resource Strain: Not on file  Food Insecurity: Not on file  Transportation Needs: Not on file  Physical Activity: Not on file  Stress: Not on file  Social Connections: Not on file  Intimate Partner Violence: Not on file                                                                                                 Objective:  Physical Exam: BP 130/84 (BP Location: Left Arm, Patient Position: Sitting, Cuff Size: Large)   Pulse 79   Temp 97.8 F (36.6 C) (Temporal)   Wt 242 lb 9.6 oz (110 kg)   SpO2 98%   BMI 35.83 kg/m    General: No acute distress. Awake and conversant.  Eyes: Normal conjunctiva, anicteric. Round symmetric pupils.  ENT: Hearing grossly intact. No nasal discharge.  Neck: Neck is supple. No masses or thyromegaly.  Respiratory: Respirations are non-labored. No auditory wheezing.  Skin: erythema, scaling,  excoriation, patchs on BLT flexorial surface of elbows Psych: Alert and oriented. Cooperative, Appropriate mood and affect, Normal judgment.  CV: No cyanosis or JVD MSK: Normal ambulation. No clubbing  Neuro: Sensation and CN  II-XII grossly normal.        Alesia Banda, MD, MS

## 2022-03-01 NOTE — Patient Instructions (Signed)
Try hydroxyzine 1/2 - 1 tablet for anxiety as needed Use ointment for rash and itching

## 2022-03-01 NOTE — Assessment & Plan Note (Signed)
Associated w/ depression No SI/HI Only wants something PRN Trial atarax 25-50 mg qid prn  Sedation side effect discussed

## 2022-03-22 ENCOUNTER — Encounter: Payer: Self-pay | Admitting: Neurology

## 2022-03-22 ENCOUNTER — Ambulatory Visit (INDEPENDENT_AMBULATORY_CARE_PROVIDER_SITE_OTHER): Payer: BC Managed Care – PPO | Admitting: Neurology

## 2022-03-22 VITALS — BP 120/87 | HR 59 | Ht 70.0 in | Wt 243.4 lb

## 2022-03-22 DIAGNOSIS — R6889 Other general symptoms and signs: Secondary | ICD-10-CM

## 2022-03-22 DIAGNOSIS — G4752 REM sleep behavior disorder: Secondary | ICD-10-CM | POA: Diagnosis not present

## 2022-03-22 DIAGNOSIS — Z9189 Other specified personal risk factors, not elsewhere classified: Secondary | ICD-10-CM

## 2022-03-22 DIAGNOSIS — G4719 Other hypersomnia: Secondary | ICD-10-CM | POA: Diagnosis not present

## 2022-03-22 DIAGNOSIS — R5382 Chronic fatigue, unspecified: Secondary | ICD-10-CM | POA: Insufficient documentation

## 2022-03-22 DIAGNOSIS — G4753 Recurrent isolated sleep paralysis: Secondary | ICD-10-CM

## 2022-03-22 NOTE — Addendum Note (Signed)
Addended by: Melvyn Novas on: 03/22/2022 04:08 PM   Modules accepted: Orders

## 2022-03-22 NOTE — Progress Notes (Signed)
SLEEP MEDICINE CLINIC    Provider:  Melvyn Novas, MD  Primary Care Physician:  Garnette Gunner, MD [address] [place] [address]     Referring Provider: Garnette Gunner, Md [address] [place],  [place] [address]          Chief Complaint according to patient   Patient presents with:     New Patient (Initial Visit)     Pt sleeps  five to six hours a night. Pt has never had a sleep study test.  Pt states he snores in his sleep. Pt's wife reports he talks and yells in his sleep, wakes up feeling paralyzed,  vivid dreams. Dream enactment.       HISTORY OF PRESENT ILLNESS:  Anthony Harris is a 46 y.o. year old White or Caucasian male patient seen here as a referral on 03/22/2022 from Dr Fanny Bien for a sleep consultation. .  Chief concern according to patient :  Frequent Nocturia, Pt's wife reports he talks and yells in his sleep, wakes up feeling paralyzed,  vivid dreams. Dream enactment started 1-2 year ago. , dreams in installments- REM sleep.    I have the pleasure of seeing [name] today, a right -hand Caucasian male with a possible sleep disorder.  He  has a past medical history of Anxiety, Attention deficit disorder, GERD (gastroesophageal reflux disease), Headache(784.0), Lumbar disc herniation, surgery-Muscle spasm of back (06/21/2012), and Right inguinal hernia (07/14/2013)., left hernia earlier.    The patient had not had a sleep study in the past.  Sleep relevant medical history: Nocturia 2-3, confusional arousals.sinus allergic- deviated septum , rhinitis, congestion,    Family medical /sleep history: maternal family member on CPAP with OSA,    Social history:  HS graduate- oppositional defiant. Patient is working with glass- windows, he  and lives in a household with spouse, 1 child in HS, 2 adults.  The patient currently works.Pets _ cats and dogs. Tobacco PPJ:KDTOI- no nicotine use in years-.  ETOH use : social , WE,   Caffeine intake in form of Coffee( 1 in AM ) Soda( lunch) Tea ( /) - Gatorade energy drinks. Regular exercise: none , back problems. .   Hobbies :/    Sleep habits are as follows: The patient's dinner time is between 6 PM. The patient goes to bed at 10-11 PM and already slept in the den - watching TV, continues to sleep for only 1 hour intervals , wakes for 3 bathroom breaks, the first time at 12 AM.   The preferred sleep position is supine , with the support of 2 pillows.  Dreams are reportedly very frequent/vivid.  6.45  AM is the usual rise time. The patient wakes up spontaneously He reports not feeling refreshed or restored in AM, feels as if he never slept. with symptoms such as dry mouth, and residual fatigue. Naps are taken frequently, lasting from 15 to 30 minutes and are refreshing .   Review of Systems: Out of a complete 14 system review, the patient complains of only the following symptoms, and all other reviewed systems are negative.:  Fatigue, sleepiness , snoring, fragmented sleep, Nocturia, vivid dreams . ADHD    How likely are you to doze in the following situations: 0 = not likely, 1 = slight chance, 2 = moderate chance, 3 = high chance   Sitting and Reading? Watching Television? Sitting inactive in a public place (theater or meeting)? As  a passenger in a car for an hour without a break? Lying down in the afternoon when circumstances permit? Sitting and talking to someone? Sitting quietly after lunch without alcohol? In a car, while stopped for a few minutes in traffic?   Total = 19/ 24 points   FSS endorsed at 41/ 63 points.   Social History   Socioeconomic History   Marital status: Married    Spouse name: Not on file   Number of children: Not on file   Years of education: Not on file   Highest education level: Not on file  Occupational History   Not on file  Tobacco Use   Smoking status: Former    Types: Cigarettes   Smokeless tobacco: Never    Tobacco comments:    at age 64  Vaping Use   Vaping Use: Never used  Substance and Sexual Activity   Alcohol use: Yes    Comment: Social drinking   Drug use: Not Currently    Comment: No active drug use. History of past drug use.   Sexual activity: Yes  Other Topics Concern   Not on file  Social History Narrative   Patient lives in [place] with his wife.   Had education up to 10th grade.   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   Has 3 kids. All healthy.         Social Determinants of Health   Financial Resource Strain: Not on file  Food Insecurity: Not on file  Transportation Needs: Not on file  Physical Activity: Not on file  Stress: Not on file  Social Connections: Not on file    Family History  Problem Relation Age of Onset   Stroke Father    Hypercholesterolemia Father    Diabetes Sister    Diabetes Brother    Hypertension Other    Cancer - Lung Other     Past Medical History:  Diagnosis Date   Anxiety    Attention deficit disorder    childhood   GERD (gastroesophageal reflux disease)    Headache(784.0)    Lumbar disc herniation    Muscle spasm of back 06/21/2012   Right inguinal hernia 07/14/2013    Past Surgical History:  Procedure Laterality Date   HERNIA REPAIR     INGUINAL HERNIA REPAIR Right 07/16/2013   Procedure: HERNIA REPAIR INGUINAL ADULT;  Surgeon: Ernestene Mention, MD;  Location: [facility] OR;  Service: General;  Laterality: Right;   INSERTION OF MESH Right 07/16/2013   Procedure: INSERTION OF MESH;  Surgeon: Ernestene Mention, MD;  Location: [facility] OR;  Service: General;  Laterality: Right;   LUMBAR EPIDURAL INJECTION     Hx; of for pain   TONSILLECTOMY       Current Outpatient Medications on File Prior to Visit  Medication Sig Dispense Refill   hydrOXYzine (ATARAX) 50 MG tablet Take 0.5-1 tablets (25-50 mg total) by mouth 3 (three) times daily as needed for anxiety. 90 tablet 0   Omeprazole Magnesium (PRILOSEC OTC PO) Take  by mouth.     triamcinolone ointment (KENALOG) 0.5 % Apply 1 Application topically 2 (two) times daily. For moderate to severe eczema.  Do not use for more than 1 week at a time. 60 g 3   No current facility-administered medications on file prior to visit.    No Known Allergies  Physical exam:  Today's Vitals   03/22/22 1513  BP: 120/87  Pulse: (!) 59  Weight:  243 lb 6.4 oz (110.4 kg)  Height: 5\' 10"  (1.778 m)   Body mass index is 34.92 kg/m.   Wt Readings from Last 3 Encounters:  03/22/22 243 lb 6.4 oz (110.4 kg)  03/01/22 242 lb 9.6 oz (110 kg)  02/01/22 240 lb 12.8 oz (109.2 kg)     Ht Readings from Last 3 Encounters:  03/22/22 5\' 10"  (1.778 m)  02/01/22 5\' 9"  (1.753 m)  05/12/20 5\' 11"  (1.803 m)      General: The patient is awake, alert and appears not in acute distress. The patient is groomed. Head: Normocephalic, atraumatic. Neck is supple.  Mallampati 3 pus-,  neck circumference:20 inches . Nasal airflow barely patent.  Retrognathia is not seen.   Dental status:  Cardiovascular:  Regular rate and cardiac rhythm by pulse,  without distended neck veins. Respiratory: Lungs are clear to auscultation.  Skin:  Without evidence of ankle edema, or rash.tattooed.  Trunk: The patient's posture is erect.   Neurologic exam : The patient is awake and alert, oriented to place and time.   Memory subjective described as intact.  Attention span & concentration ability appears LIMITED, interruption, impulsive. Speech is fluent,  without dysarthria, dysphonia or aphasia.  Mood and affect are appropriate.   Cranial nerves: no loss of smell or taste reported  Pupils are equal and briskly reactive to light. Funduscopic exam deferred. .  Extraocular movements in vertical and horizontal planes were intact and without nystagmus. No Diplopia. Visual fields by finger perimetry are intact. Hearing was intact to soft voice and finger rubbing.    Facial sensation intact to fine  touch.  Facial motor strength is symmetric and tongue and uvula move midline.  Neck ROM : rotation, tilt and flexion extension were normal for age and shoulder shrug was symmetrical.    Motor exam:  Symmetric bulk, tone and ROM.   Normal tone without cog -wheeling, symmetric grip strength .   Sensory: deferred.   Coordination: Rapid alternating movements in the fingers/hands were of normal speed.  The Finger-to-nose maneuver was intact without evidence of ataxia, dysmetria or tremor.   Gait and station: Patient could rise unassisted from a seated position, walked without assistive device.  Stance is of normal width/ base - he is slightly bend.  Toe and heel walk were deferred.  Deep tendon reflexes: in the  upper and lower extremities are symmetric and intact.  Babinski response was deferred.        After spending a total time of  45  minutes face to face and additional time for physical and neurologic examination, review of laboratory studies,  personal review of imaging studies, reports and results of other testing and review of referral information / records as far as provided in visit, I have established the following assessments:  1) Mr. [birth year] presents today with his wife for an interval exam in relationship to a sleep evaluation.  I am quite curious about this case the patient endorsed a high Epworth sleepiness score 19 out of 24 fatigue is moderately elevated 41 out of 63 points, he carries a diagnosis or possible diagnosis of ADHD and he reports that his excessive daytime sleepiness for the last 18 months has been paired with a lot more dreams sleep sleep and in sleep and dream enactment.    He has also woken up being unable to being unable to move sleep paralysis he has asked his wife for help for help to wake him ""..  He has  frequent frequent bathroom breaks frequent bathroom breaks.  When he goes back to the bed he cannot stand start with the sequence to the same dream that he  is that he finished the but he finished.  This kind of dreaming and then stop months with very active active dreams active dreams sometimes dream enactment can mean 2  disorders :  REM BD- or narcolepsy  The dream force or pressure of draining points much more to narcolepsy. He dreams even in short naps. He has never had cataplexy.    I he has high risk factors for sleep apnea, OSA-  he was hospitalized and telemetry showed apnea, severe apnea.      My Plan is to proceed with:  0) I suspect a high risk for apnea- he may be very much sleepy due to apnea.  SPLIT if AHI exceeds / 20   1) PSG in Bed 2 with MSLT to follow, patient is apprehensive. He would like to have a HST- I explained the nature of PSG with MSLT,  if he can't do it we can first rule out apnea with a HST.   2) he is not on REM sleep supressant medications.    3) he is not a candidate for INSPIRE.     I would like to thank Garnette Gunner, MD and Garnette Gunner, Md [address] [place],  [place] [address] for allowing me to meet with and to take care of this interesting patient.   II plan to follow up either personally or through our NP within 2-4 months.   CC: I will share my notes with PCP ! .  Electronically signed by: Melvyn Novas, MD 03/22/2022 3:21 PM  [facility] Board certified by The [employer] of Sleep Medicine and Diplomate of the [employer] of Sleep Medicine. Board certified In Neurology through the ABPN, Fellow of the [employer] of Neurology. Medical Director of [facility].

## 2022-03-29 ENCOUNTER — Telehealth: Payer: Self-pay | Admitting: Family Medicine

## 2022-03-29 ENCOUNTER — Ambulatory Visit: Payer: BC Managed Care – PPO | Admitting: Family Medicine

## 2022-03-29 NOTE — Telephone Encounter (Signed)
Pt was a no show 9/20 for an OV with Dr. Grandville Silos. This is his first, letter has been sent

## 2022-03-31 LAB — NARCOLEPSY EVALUATION
DQA1*01:02: NEGATIVE
DQB1*06:02: NEGATIVE

## 2022-04-03 ENCOUNTER — Encounter: Payer: Self-pay | Admitting: Neurology

## 2022-04-03 NOTE — Telephone Encounter (Signed)
1st no show, fee waived, letter sent 

## 2022-04-04 NOTE — Telephone Encounter (Signed)
Called and spoke w/ wife about lab results. She verbalized understanding and will let her husband know. Advised insurance auth still pending for sleep study but once obtained, they will be called to schedule this.

## 2022-07-19 ENCOUNTER — Telehealth: Payer: Self-pay | Admitting: Neurology

## 2022-07-19 ENCOUNTER — Telehealth: Payer: BC Managed Care – PPO | Admitting: Nurse Practitioner

## 2022-07-19 DIAGNOSIS — J020 Streptococcal pharyngitis: Secondary | ICD-10-CM | POA: Diagnosis not present

## 2022-07-19 MED ORDER — AZITHROMYCIN 250 MG PO TABS: ORAL_TABLET | ORAL | 0 refills | Status: AC

## 2022-07-19 NOTE — Telephone Encounter (Signed)
NPSG/MSLT- [employer] Josem Kaufmann: [id number] (exp. 07/19/22 to 09/16/22)   Patient is scheduled at [facility] for 08/27/22 at 8 pm and then 08/28/22.   Mailed packet to the patient.

## 2022-07-19 NOTE — Progress Notes (Signed)
E-Visit for Sore Throat - Strep Symptoms  We are sorry that you are not feeling well.  Here is how we plan to help!  Based on what you have shared with me it is likely that you have strep pharyngitis.  Strep pharyngitis is inflammation and infection in the back of the throat.  This is an infection cause by bacteria and is treated with antibiotics.  I have prescribed Azithromycin 250 mg two tablets today and then one daily for 4 additional days. For throat pain, we recommend over the counter oral pain relief medications such as acetaminophen or aspirin, or anti-inflammatory medications such as ibuprofen or naproxen sodium. Topical treatments such as oral throat lozenges or sprays may be used as needed. Strep infections are not as easily transmitted as other respiratory infections, however we still recommend that you avoid close contact with loved ones, especially the very young and elderly.  Remember to wash your hands thoroughly throughout the day as this is the number one way to prevent the spread of infection and wipe down door knobs and counters with disinfectant.   Home Care: Only take medications as instructed by your medical team. Complete the entire course of an antibiotic. Do not take these medications with alcohol. A steam or ultrasonic humidifier can help congestion.  You can place a towel over your head and breathe in the steam from hot water coming from a faucet. Avoid close contacts especially the very young and the elderly. Cover your mouth when you cough or sneeze. Always remember to wash your hands.  Get Help Right Away If: You develop worsening fever or sinus pain. You develop a severe head ache or visual changes. Your symptoms persist after you have completed your treatment plan.  Make sure you Understand these instructions. Will watch your condition. Will get help right away if you are not doing well or get worse.   Thank you for choosing an e-visit.  Your e-visit  answers were reviewed by a board certified advanced clinical practitioner to complete your personal care plan. Depending upon the condition, your plan could have included both over the counter or prescription medications.  Please review your pharmacy choice. Make sure the pharmacy is open so you can pick up prescription now. If there is a problem, you may contact your provider through MyChart messaging and have the prescription routed to another pharmacy.  Your safety is important to us. If you have drug allergies check your prescription carefully.   For the next 24 hours you can use MyChart to ask questions about today's visit, request a non-urgent call back, or ask for a work or school excuse. You will get an email in the next two days asking about your experience. I hope that your e-visit has been valuable and will speed your recovery.   Meds ordered this encounter  Medications   azithromycin (ZITHROMAX) 250 MG tablet    Sig: Take 2 tablets on day 1, then 1 tablet daily on days 2 through 5    Dispense:  6 tablet    Refill:  0     I spent approximately 5 minutes reviewing the patient's history, current symptoms and coordinating their care today.   

## 2023-01-02 ENCOUNTER — Ambulatory Visit: Payer: BC Managed Care – PPO | Admitting: Family Medicine

## 2023-01-02 ENCOUNTER — Encounter: Payer: Self-pay | Admitting: Family Medicine

## 2023-01-02 VITALS — BP 122/88 | HR 83 | Temp 97.8°F | Ht 70.0 in | Wt 241.8 lb

## 2023-01-02 DIAGNOSIS — B354 Tinea corporis: Secondary | ICD-10-CM

## 2023-01-02 DIAGNOSIS — Z1159 Encounter for screening for other viral diseases: Secondary | ICD-10-CM | POA: Diagnosis not present

## 2023-01-02 DIAGNOSIS — Z114 Encounter for screening for human immunodeficiency virus [HIV]: Secondary | ICD-10-CM

## 2023-01-02 DIAGNOSIS — E6609 Other obesity due to excess calories: Secondary | ICD-10-CM

## 2023-01-02 DIAGNOSIS — Z1211 Encounter for screening for malignant neoplasm of colon: Secondary | ICD-10-CM

## 2023-01-02 DIAGNOSIS — Z6834 Body mass index (BMI) 34.0-34.9, adult: Secondary | ICD-10-CM | POA: Diagnosis not present

## 2023-01-02 LAB — CBC WITH DIFFERENTIAL/PLATELET
Basophils Absolute: 0 10*3/uL (ref 0.0–0.1)
Basophils Relative: 0.5 % (ref 0.0–3.0)
Eosinophils Absolute: 0.3 10*3/uL (ref 0.0–0.7)
Eosinophils Relative: 4.2 % (ref 0.0–5.0)
HCT: 47.9 % (ref 39.0–52.0)
Hemoglobin: 15.7 g/dL (ref 13.0–17.0)
Lymphocytes Relative: 27.6 % (ref 12.0–46.0)
Lymphs Abs: 2 10*3/uL (ref 0.7–4.0)
MCHC: 32.7 g/dL (ref 30.0–36.0)
MCV: 90.7 fl (ref 78.0–100.0)
Monocytes Absolute: 0.7 10*3/uL (ref 0.1–1.0)
Monocytes Relative: 9.6 % (ref 3.0–12.0)
Neutro Abs: 4.3 10*3/uL (ref 1.4–7.7)
Neutrophils Relative %: 58.1 % (ref 43.0–77.0)
Platelets: 279 10*3/uL (ref 150.0–400.0)
RBC: 5.28 Mil/uL (ref 4.22–5.81)
RDW: 14 % (ref 11.5–15.5)
WBC: 7.3 10*3/uL (ref 4.0–10.5)

## 2023-01-02 LAB — COMPREHENSIVE METABOLIC PANEL
ALT: 30 U/L (ref 0–53)
AST: 23 U/L (ref 0–37)
Albumin: 4.1 g/dL (ref 3.5–5.2)
Alkaline Phosphatase: 70 U/L (ref 39–117)
BUN: 13 mg/dL (ref 6–23)
CO2: 24 mEq/L (ref 19–32)
Calcium: 9.6 mg/dL (ref 8.4–10.5)
Chloride: 103 mEq/L (ref 96–112)
Creatinine, Ser: 1.01 mg/dL (ref 0.40–1.50)
GFR: 87.41 mL/min (ref >60.00)
Glucose, Bld: 129 mg/dL — ABNORMAL HIGH (ref 70–99)
Potassium: 4 mEq/L (ref 3.5–5.1)
Sodium: 137 mEq/L (ref 135–145)
Total Bilirubin: 0.4 mg/dL (ref 0.2–1.2)
Total Protein: 7.1 g/dL (ref 6.0–8.3)

## 2023-01-02 LAB — LIPID PANEL
Cholesterol: 182 mg/dL (ref 0–200)
HDL: 59.2 mg/dL (ref >39.00)
LDL Cholesterol: 88 mg/dL (ref 0–99)
NonHDL: 122.7
Total CHOL/HDL Ratio: 3
Triglycerides: 175 mg/dL — ABNORMAL HIGH (ref 0.0–149.0)
VLDL: 35 mg/dL (ref 0.0–40.0)

## 2023-01-02 LAB — VITAMIN B12: Vitamin B-12: 283 pg/mL (ref 211–911)

## 2023-01-02 LAB — TSH: TSH: 1.62 u[IU]/mL (ref 0.35–5.50)

## 2023-01-02 LAB — VITAMIN D 25 HYDROXY (VIT D DEFICIENCY, FRACTURES): VITD: 10.39 ng/mL — ABNORMAL LOW (ref 30.00–100.00)

## 2023-01-02 LAB — HEMOGLOBIN A1C: Hgb A1c MFr Bld: 5.8 % (ref 4.6–6.5)

## 2023-01-02 MED ORDER — KETOCONAZOLE 2 % EX CREA: TOPICAL_CREAM | CUTANEOUS | 0 refills | Status: DC

## 2023-01-02 NOTE — Patient Instructions (Addendum)
It was a pleasure meeting you today. Thank you for allowing me to take part in your health care.  Our goals for today as we discussed include:  Use Ketoconazole cream twice a day for 21 days  We will get some labs today.  If they are abnormal or we need to do something about them, I will call you.  If they are normal, I will send you a message on MyChart (if it is active) or a letter in the mail.  If you don't hear from [place] in 2 weeks, please call the office at the number below.   Recommend Tetanus Vaccination.  This is given every 10 years.    Follow up as needed  Follow up in 1 year for annual physical  If you have any questions or concerns, please do not hesitate to call the office at [phone].  I look forward to our next visit and until then take care and stay safe.  Regards,   Dana Allan, MD   [facility]

## 2023-01-02 NOTE — Progress Notes (Signed)
SUBJECTIVE:   Chief Complaint  Patient presents with   Establish Care    NP. Est Care, concerns with skin fungus on left upper abdomen and waist   HPI Presents to clinic to establish care  Body rash Requesting medication cream for fungal infection.  Reports rash on upper trunk, groin and arms. Has had similar symptoms in past that resolved with medicated creams.  Endorses itching.  No fevers, blisters, environmental changes or known triggers.  No other household contacts affected.    PERTINENT PMH / PSH: ADHD Depression/Anxiety  Previous tobacco use  OBJECTIVE:  BP 122/88 (BP Location: Right Arm)   Pulse 83   Temp 97.8 F (36.6 C)   Ht 5\' 10"  (1.778 m)   Wt 241 lb 12.8 oz (109.7 kg)   SpO2 97%   BMI 34.69 kg/m    Physical Exam Vitals reviewed.  HENT:     Head: Normocephalic.     Right Ear: Tympanic membrane, ear canal and external ear normal.     Left Ear: Tympanic membrane, ear canal and external ear normal.     Nose: Nose normal.     Mouth/Throat:     Mouth: Mucous membranes are moist.  Eyes:     Conjunctiva/sclera: Conjunctivae normal.     Pupils: Pupils are equal, round, and reactive to light.  Neck:     Thyroid: No thyromegaly or thyroid tenderness.     Vascular: No carotid bruit.  Cardiovascular:     Rate and Rhythm: Normal rate and regular rhythm.     Pulses: Normal pulses.     Heart sounds: Normal heart sounds.  Pulmonary:     Effort: Pulmonary effort is normal.     Breath sounds: Normal breath sounds.  Abdominal:     General: Abdomen is flat. Bowel sounds are normal.     Palpations: Abdomen is soft.  Musculoskeletal:        General: Normal range of motion.     Cervical back: Normal range of motion and neck supple.     Right lower leg: No edema.     Left lower leg: No edema.  Lymphadenopathy:     Cervical: No cervical adenopathy.  Neurological:     Mental Status: He is alert.  Psychiatric:        Mood and Affect: Mood normal.         Behavior: Behavior normal.        Thought Content: Thought content normal.        Judgment: Judgment normal.                 01/23/2023    3:46 PM 01/02/2023   10:28 AM 03/01/2022    3:34 PM 02/01/2022    4:09 PM 02/01/2022    3:38 PM  Depression screen PHQ 2/9  Decreased Interest 2 3 3 2 1   Down, Depressed, Hopeless 2 1 2 1  0  PHQ - 2 Score 4 4 5 3 1   Altered sleeping 3 3 3 3    Tired, decreased energy 2 2 3 3    Change in appetite 2 0 2 1   Feeling bad or failure about yourself  2 0 3 1   Trouble concentrating 2 3 2 2    Moving slowly or fidgety/restless 3 0 3 0   Suicidal thoughts 0 0 0 0   PHQ-9 Score 18 12 21 13    Difficult doing work/chores Somewhat difficult Very difficult Somewhat difficult Not difficult at all  01/23/2023    3:46 PM 01/02/2023   10:28 AM 03/01/2022    3:34 PM 02/01/2022    4:10 PM  GAD 7 : Generalized Anxiety Score  Nervous, Anxious, on Edge 3 3 3 3   Control/stop worrying 3 3 3 3   Worry too much - different things 3 3 3 3   Trouble relaxing 3 3 3 3   Restless 3 2 3 3   Easily annoyed or irritable 3 3 3 3   Afraid - awful might happen 3 2 0 3  Total GAD 7 Score 21 19 18 21   Anxiety Difficulty Very difficult Very difficult Somewhat difficult Very difficult    ASSESSMENT/PLAN:  Tinea corporis Assessment & Plan: Ketoconazole cream BID x 21 days If no improvement follow up with PCP  Orders: -     Comprehensive metabolic panel -     CBC with Differential/Platelet  Need for hepatitis C screening test -     HIV Antibody (routine testing w rflx)  Encounter for screening for HIV -     Hepatitis C antibody  Class 1 obesity due to excess calories without serious comorbidity with body mass index (BMI) of 34.0 to 34.9 in adult -     Comprehensive metabolic panel -     Hemoglobin A1c -     CBC with Differential/Platelet -     Lipid panel -     TSH -     VITAMIN D 25 Hydroxy (Vit-D Deficiency, Fractures) -     Vitamin B12  Colon cancer  screening -     Ambulatory referral to Gastroenterology   PDMP reviewed  Return in about 1 year (around 01/02/2024), or if symptoms worsen or fail to improve, for PCP.  Dana Allan, MD

## 2023-01-03 LAB — HEPATITIS C ANTIBODY: Hepatitis C Ab: NONREACTIVE

## 2023-01-03 LAB — HIV ANTIBODY (ROUTINE TESTING W REFLEX): HIV 1&2 Ab, 4th Generation: NONREACTIVE

## 2023-01-04 ENCOUNTER — Ambulatory Visit: Payer: BC Managed Care – PPO | Admitting: Family Medicine

## 2023-01-05 ENCOUNTER — Other Ambulatory Visit: Payer: Self-pay | Admitting: Family Medicine

## 2023-01-05 DIAGNOSIS — E559 Vitamin D deficiency, unspecified: Secondary | ICD-10-CM

## 2023-01-05 MED ORDER — VITAMIN D (ERGOCALCIFEROL) 1.25 MG (50000 UNIT) PO CAPS: 50000.0000 [IU] | ORAL_CAPSULE | ORAL | 1 refills | Status: DC

## 2023-01-17 ENCOUNTER — Other Ambulatory Visit: Payer: Self-pay | Admitting: Family Medicine

## 2023-01-17 DIAGNOSIS — B354 Tinea corporis: Secondary | ICD-10-CM

## 2023-01-18 ENCOUNTER — Ambulatory Visit: Payer: BC Managed Care – PPO | Admitting: Nurse Practitioner

## 2023-01-23 ENCOUNTER — Ambulatory Visit (INDEPENDENT_AMBULATORY_CARE_PROVIDER_SITE_OTHER): Payer: BC Managed Care – PPO | Admitting: Nurse Practitioner

## 2023-01-23 ENCOUNTER — Encounter: Payer: Self-pay | Admitting: Nurse Practitioner

## 2023-01-23 VITALS — BP 122/78 | HR 90 | Temp 97.8°F | Ht 70.0 in | Wt 241.8 lb

## 2023-01-23 DIAGNOSIS — R202 Paresthesia of skin: Secondary | ICD-10-CM

## 2023-01-23 DIAGNOSIS — R200 Anesthesia of skin: Secondary | ICD-10-CM | POA: Diagnosis not present

## 2023-01-23 DIAGNOSIS — L309 Dermatitis, unspecified: Secondary | ICD-10-CM

## 2023-01-23 MED ORDER — CETIRIZINE HCL 10 MG PO TABS: 10.0000 mg | ORAL_TABLET | Freq: Every day | ORAL | 11 refills | Status: DC

## 2023-01-23 MED ORDER — METHYLPREDNISOLONE 4 MG PO TBPK: ORAL_TABLET | ORAL | 0 refills | Status: DC

## 2023-01-23 NOTE — Progress Notes (Signed)
Bethanie Dicker, NP-C Phone: [phone]  Anthony Harris is a 47 y.o. male who presents today for itching on bilateral legs and hand numbness.   Patient with numbness and tingling in his 4th and 5th fingers on his right hand for the last week. He has decreased sensation in that side of his hand. The feeling is constant, it waxes and wanes in intensity. It goes down both fingers into his hand and stops at his wrist. The numbness and tingling is worse when he is using his right hand more. He had been using his right hand more and having to grip more prior to this starting.  The front of both of his lower legs have been itching for the past 2 months. He does have a Hx of eczema for the last several years. It has never occurred on his lower extremities before. He denies any new exposure to lotions, soaps, foods, etc. He has red, dry, patchy areas.   Social History   Tobacco Use  Smoking Status Former   Types: Cigarettes  Smokeless Tobacco Never  Tobacco Comments   at age 19    Current Outpatient Medications on File Prior to Visit  Medication Sig Dispense Refill   Vitamin D, Ergocalciferol, (DRISDOL) 1.25 MG (50000 UNIT) CAPS capsule Take 1 capsule (50,000 Units total) by mouth every 7 (seven) days. 12 capsule 1   ketoconazole (NIZORAL) 2 % cream APPLY TWO TIMES DAILY FOR 21 DAYS (MAXIMUM OF 2.8 GRAMS DAILY) 60 g 0   No current facility-administered medications on file prior to visit.    ROS see history of present illness  Objective  Physical Exam Vitals:   01/23/23 1531  BP: 122/78  Pulse: 90  Temp: 97.8 F (36.6 C)  SpO2: 96%    BP Readings from Last 3 Encounters:  01/23/23 122/78  01/02/23 122/88  03/22/22 120/87   Wt Readings from Last 3 Encounters:  01/23/23 241 lb 12.8 oz (109.7 kg)  01/02/23 241 lb 12.8 oz (109.7 kg)  03/22/22 243 lb 6.4 oz (110.4 kg)    Physical Exam Constitutional:      General: He is not in acute distress.    Appearance: Normal appearance.   HENT:     Head: Normocephalic.  Cardiovascular:     Rate and Rhythm: Normal rate and regular rhythm.     Heart sounds: Normal heart sounds.  Pulmonary:     Effort: Pulmonary effort is normal.     Breath sounds: Normal breath sounds.  Musculoskeletal:     Right hand: No tenderness. Normal range of motion. Normal strength. Decreased sensation of the ulnar distribution.     Left hand: Normal.  Skin:    General: Skin is warm and dry.     Findings: Rash (dry, erythema, scaling) present.  Neurological:     General: No focal deficit present.     Mental Status: He is alert.  Psychiatric:        Mood and Affect: Mood normal.        Behavior: Behavior normal.    Assessment/Plan: Please see individual problem list.  Eczema, unspecified type Assessment & Plan: Area consistent with eczema. Will treat with MDP and Zyrtec daily. Advised OTC creams/lotions for eczema such as CeraVe or Aquaphor to help with relief of symptoms.  Orders: -     methylPREDNISolone; Take as directed.  Dispense: 21 each; Refill: 0 -     Cetirizine HCl; Take 1 tablet (10 mg total) by mouth daily.  Dispense:  30 tablet; Refill: 11  Numbness and tingling in right hand Assessment & Plan: Concern for cubital tunnel syndrome. Will refer to Ortho for further evaluation. Information provided to patient. Discussed braces that he can wear to see if he gets relief of symptoms.  Orders: -     Ambulatory referral to Orthopedics    Return if symptoms worsen or fail to improve.   Bethanie Dicker, NP-C [facility] [employer]

## 2023-01-25 NOTE — Assessment & Plan Note (Signed)
Area consistent with eczema. Will treat with MDP and Zyrtec daily. Advised OTC creams/lotions for eczema such as CeraVe or Aquaphor to help with relief of symptoms.

## 2023-01-25 NOTE — Assessment & Plan Note (Addendum)
Concern for cubital tunnel syndrome. Will refer to Ortho for further evaluation. Information provided to patient. Discussed braces that he can wear to see if he gets relief of symptoms.

## 2023-01-31 ENCOUNTER — Encounter: Payer: Self-pay | Admitting: Family Medicine

## 2023-01-31 DIAGNOSIS — B354 Tinea corporis: Secondary | ICD-10-CM | POA: Insufficient documentation

## 2023-01-31 DIAGNOSIS — Z1159 Encounter for screening for other viral diseases: Secondary | ICD-10-CM | POA: Insufficient documentation

## 2023-01-31 DIAGNOSIS — Z114 Encounter for screening for human immunodeficiency virus [HIV]: Secondary | ICD-10-CM | POA: Insufficient documentation

## 2023-01-31 DIAGNOSIS — E6609 Other obesity due to excess calories: Secondary | ICD-10-CM | POA: Insufficient documentation

## 2023-01-31 DIAGNOSIS — Z1211 Encounter for screening for malignant neoplasm of colon: Secondary | ICD-10-CM | POA: Insufficient documentation

## 2023-01-31 NOTE — Assessment & Plan Note (Addendum)
Ketoconazole cream BID x 21 days If no improvement follow up with PCP

## 2023-02-07 ENCOUNTER — Encounter (INDEPENDENT_AMBULATORY_CARE_PROVIDER_SITE_OTHER): Payer: Self-pay

## 2023-02-15 ENCOUNTER — Other Ambulatory Visit: Payer: BC Managed Care – PPO

## 2023-02-22 ENCOUNTER — Encounter: Payer: BC Managed Care – PPO | Admitting: Family Medicine

## 2023-06-19 ENCOUNTER — Other Ambulatory Visit: Payer: Self-pay | Admitting: Family Medicine

## 2023-06-19 DIAGNOSIS — E559 Vitamin D deficiency, unspecified: Secondary | ICD-10-CM

## 2023-06-19 NOTE — Telephone Encounter (Signed)
Pt started weekly Vit D in June. How long is patient supposed to continue the weekly dose. Will pt go to OTC daily or would you like to refill this medication.  Lab note was not specific enough

## 2023-06-21 ENCOUNTER — Ambulatory Visit: Payer: BC Managed Care – PPO | Admitting: Family Medicine

## 2023-08-28 ENCOUNTER — Ambulatory Visit (INDEPENDENT_AMBULATORY_CARE_PROVIDER_SITE_OTHER): Payer: BC Managed Care – PPO | Admitting: Family Medicine

## 2023-08-28 ENCOUNTER — Encounter: Payer: Self-pay | Admitting: Family Medicine

## 2023-08-28 VITALS — BP 114/80 | HR 87 | Temp 98.2°F | Resp 18 | Ht 70.0 in | Wt 228.5 lb

## 2023-08-28 DIAGNOSIS — R350 Frequency of micturition: Secondary | ICD-10-CM

## 2023-08-28 DIAGNOSIS — R1032 Left lower quadrant pain: Secondary | ICD-10-CM

## 2023-08-28 DIAGNOSIS — Z1211 Encounter for screening for malignant neoplasm of colon: Secondary | ICD-10-CM

## 2023-08-28 LAB — URINALYSIS, ROUTINE W REFLEX MICROSCOPIC
Bilirubin Urine: NEGATIVE
Hgb urine dipstick: NEGATIVE
Ketones, ur: NEGATIVE
Leukocytes,Ua: NEGATIVE
Nitrite: NEGATIVE
RBC / HPF: NONE SEEN (ref 0–?)
Specific Gravity, Urine: 1.015 (ref 1.000–1.030)
Total Protein, Urine: NEGATIVE
Urine Glucose: NEGATIVE
Urobilinogen, UA: 0.2 (ref 0.0–1.0)
WBC, UA: NONE SEEN (ref 0–?)
pH: 7 (ref 5.0–8.0)

## 2023-08-28 LAB — POC URINALSYSI DIPSTICK (AUTOMATED)
Bilirubin, UA: NEGATIVE
Blood, UA: NEGATIVE
Glucose, UA: NEGATIVE
Ketones, UA: NEGATIVE
Leukocytes, UA: NEGATIVE
Nitrite, UA: NEGATIVE
Protein, UA: NEGATIVE
Spec Grav, UA: 1.02 (ref 1.010–1.025)
Urobilinogen, UA: 0.2 U/dL
pH, UA: 7 (ref 5.0–8.0)

## 2023-08-28 LAB — COMPREHENSIVE METABOLIC PANEL
ALT: 19 U/L (ref 0–53)
AST: 18 U/L (ref 0–37)
Albumin: 4.3 g/dL (ref 3.5–5.2)
Alkaline Phosphatase: 71 U/L (ref 39–117)
BUN: 14 mg/dL (ref 6–23)
CO2: 33 meq/L — ABNORMAL HIGH (ref 19–32)
Calcium: 9.6 mg/dL (ref 8.4–10.5)
Chloride: 100 meq/L (ref 96–112)
Creatinine, Ser: 0.94 mg/dL (ref 0.40–1.50)
GFR: 94.85 mL/min (ref >60.00)
Glucose, Bld: 114 mg/dL — ABNORMAL HIGH (ref 70–99)
Potassium: 4.7 meq/L (ref 3.5–5.1)
Sodium: 143 meq/L (ref 135–145)
Total Bilirubin: 0.4 mg/dL (ref 0.2–1.2)
Total Protein: 7.4 g/dL (ref 6.0–8.3)

## 2023-08-28 LAB — PSA: PSA: 0.96 ng/mL (ref 0.10–4.00)

## 2023-08-28 NOTE — Progress Notes (Signed)
 SUBJECTIVE:   Chief Complaint  Patient presents with   Groin Pain    X 2 weeks left side    Heartburn    Takes OTC and it is not working   HPI Presents for acute pain  Discussed the use of AI scribe software for clinical note transcription with the patient, who gave verbal consent to proceed.  History of Present Illness Anthony Harris "[place]" is a 48 year old male who presents with left groin pain.  He has been experiencing left groin pain for the past two to three weeks, which is more noticeable during erections and ejaculation. The pain is localized to the left groin area without any associated bulging. He has a history of hernias on both sides but does not currently feel a bulge. No fever, blood in urine, penile discharge, or back pain related to the groin issue. He has not identified any specific event or activity that triggered the pain and has not taken any pain medication, preferring to identify the cause rather than mask the symptoms.  He has a history of back pain from previous surgeries, which he states is unrelated to the current groin pain. He also reports numbness in his leg, which he attributes to past back surgeries. No scrotal pain or changes in urination frequency. He denies any history of sexually transmitted infections.  He has been married for thirty years and is currently focused on weight loss, having reduced his weight from the 240s to the 220s by increasing water intake and changing his diet. He is not currently exercising but plans to start soon.      PERTINENT PMH / PSH: As above  OBJECTIVE:  BP 114/80   Pulse 87   Temp 98.2 F (36.8 C)   Resp 18   Ht 5\' 10"  (1.778 m)   Wt 228 lb 8 oz (103.6 kg)   SpO2 99%   BMI 32.79 kg/m    Physical Exam Exam conducted with a chaperone present.  Abdominal:     Tenderness: There is no right CVA tenderness, left CVA tenderness, guarding or rebound. Negative signs include Murphy's sign, Rovsing's sign,  McBurney's sign and psoas sign.     Hernia: There is no hernia in the left inguinal area or right inguinal area.  Genitourinary:    Penis: No discharge.      Testes:        Left: Tenderness present. Mass not present.     Epididymis:     Right: Normal.     Left: Tenderness present. No mass.             01/23/2023    3:46 PM 01/02/2023   10:28 AM 03/01/2022    3:34 PM 02/01/2022    4:09 PM 02/01/2022    3:38 PM  Depression screen PHQ 2/9  Decreased Interest 2 3 3 2 1   Down, Depressed, Hopeless 2 1 2 1  0  PHQ - 2 Score 4 4 5 3 1   Altered sleeping 3 3 3 3    Tired, decreased energy 2 2 3 3    Change in appetite 2 0 2 1   Feeling bad or failure about yourself  2 0 3 1   Trouble concentrating 2 3 2 2    Moving slowly or fidgety/restless 3 0 3 0   Suicidal thoughts 0 0 0 0   PHQ-9 Score 18 12 21 13    Difficult doing work/chores Somewhat difficult Very difficult Somewhat difficult Not difficult at all  01/23/2023    3:46 PM 01/02/2023   10:28 AM 03/01/2022    3:34 PM 02/01/2022    4:10 PM  GAD 7 : Generalized Anxiety Score  Nervous, Anxious, on Edge 3 3 3 3   Control/stop worrying 3 3 3 3   Worry too much - different things 3 3 3 3   Trouble relaxing 3 3 3 3   Restless 3 2 3 3   Easily annoyed or irritable 3 3 3 3   Afraid - awful might happen 3 2 0 3  Total GAD 7 Score 21 19 18 21   Anxiety Difficulty Very difficult Very difficult Somewhat difficult Very difficult    ASSESSMENT/PLAN:  Left inguinal pain Assessment & Plan: Pain in the left groin area for the past 2-3 weeks, exacerbated during erection and ejaculation. No bulge palpable, no urinary symptoms, no fever, no penile discharge, no scrotal pain.  Tenderness along lateral scrotal area and upward into mons. Differential diagnosis includes kidney stone, infection, testicular torsion, inguinal hernia or musculoskeletal strain. -Order urine analysis to rule out infection or kidney stone. -Order scrotal ultrasound to rule out  testicular torsion or other structural abnormalities. -Order CBC and kidney function tests to assess overall health and rule out infection. -Plan to follow up with results and further management.   Orders: -     US SCROTUM W/DOPPLER -     Comprehensive metabolic panel -     Urinalysis, Routine w reflex microscopic  Colon cancer screening -     Cologuard  Urinary frequency -     POCT Urinalysis Dipstick (Automated) -     Urinalysis, Routine w reflex microscopic -     PSA     PDMP reviewed  Return if symptoms worsen or fail to improve, for PCP.  Dana Allan, MD

## 2023-08-28 NOTE — Patient Instructions (Signed)
It was a pleasure meeting you today. Thank you for allowing me to take part in your health care.  Our goals for today as we discussed include:  We will get some labs today.  If they are abnormal or we need to do something about them, I will call you.  If they are normal, I will send you a message on MyChart (if it is active) or a letter in the mail.  If you don't hear from [place] in 2 weeks, please call the office at the number below.   Ultrasound of scrotum.  They will call to schedule an appointment   This is a list of the screening recommended for you and due dates:  Health Maintenance  Topic Date Due   DTaP/Tdap/Td vaccine (2 - Tdap) 08/06/2018   Colon Cancer Screening  Never done   COVID-19 Vaccine (1 - 2024-25 season) Never done   Zoster (Shingles) Vaccine (1 of 2) Never done   Flu Shot  10/08/2023*   Hepatitis C Screening  Completed   HIV Screening  Completed   HPV Vaccine  Aged Out  *Topic was postponed. The date shown is not the original due date.      If you have any questions or concerns, please do not hesitate to call the office at [phone].  I look forward to our next visit and until then take care and stay safe.  Regards,   Dana Allan, MD   [facility]

## 2023-08-29 ENCOUNTER — Ambulatory Visit: Payer: BC Managed Care – PPO | Admitting: Family Medicine

## 2023-08-30 ENCOUNTER — Telehealth: Payer: Self-pay | Admitting: Family Medicine

## 2023-08-30 NOTE — Telephone Encounter (Signed)
Lft pt wife vm for pt to call ofc to sch [place]. Thank you

## 2023-09-03 ENCOUNTER — Encounter: Payer: Self-pay | Admitting: Family Medicine

## 2023-09-03 DIAGNOSIS — R350 Frequency of micturition: Secondary | ICD-10-CM | POA: Insufficient documentation

## 2023-09-03 DIAGNOSIS — R1032 Left lower quadrant pain: Secondary | ICD-10-CM | POA: Insufficient documentation

## 2023-09-03 NOTE — Assessment & Plan Note (Signed)
 Pain in the left groin area for the past 2-3 weeks, exacerbated during erection and ejaculation. No bulge palpable, no urinary symptoms, no fever, no penile discharge, no scrotal pain.  Tenderness along lateral scrotal area and upward into mons. Differential diagnosis includes kidney stone, infection, testicular torsion, inguinal hernia or musculoskeletal strain. -Order urine analysis to rule out infection or kidney stone. -Order scrotal ultrasound to rule out testicular torsion or other structural abnormalities. -Order CBC and kidney function tests to assess overall health and rule out infection. -Plan to follow up with results and further management.

## 2023-09-06 ENCOUNTER — Telehealth: Payer: Self-pay

## 2023-09-06 NOTE — Telephone Encounter (Signed)
 Called pt and spoke to wife and she thought he missed the appointment, but appointment is next week. She stated that he will be there.

## 2023-09-06 NOTE — Telephone Encounter (Signed)
 Copied from CRM [id number]. Topic: Appointments - Appointment Scheduling >> Sep 05, 2023  4:48 PM [place] J wrote: Patient needs to reschedule appointment for ultrasound.

## 2023-09-11 ENCOUNTER — Ambulatory Visit
Admission: RE | Admit: 2023-09-11 | Discharge: 2023-09-11 | Disposition: A | Discharge status: Home / Self Care | Payer: BC Managed Care – PPO | Source: Ambulatory Visit | Attending: Family Medicine | Admitting: Family Medicine

## 2023-09-11 DIAGNOSIS — R1032 Left lower quadrant pain: Secondary | ICD-10-CM | POA: Diagnosis not present

## 2023-09-11 DIAGNOSIS — N433 Hydrocele, unspecified: Secondary | ICD-10-CM | POA: Diagnosis not present

## 2023-09-11 DIAGNOSIS — R1030 Lower abdominal pain, unspecified: Secondary | ICD-10-CM | POA: Diagnosis not present

## 2023-09-19 ENCOUNTER — Encounter: Payer: Self-pay | Admitting: Family Medicine

## 2023-10-03 ENCOUNTER — Telehealth: Payer: Self-pay | Admitting: Family Medicine

## 2023-10-03 NOTE — Telephone Encounter (Signed)
 Dr Clent Ridges is leaving the practice and your appointment on 01/08/2024  needs to be rescheduled with another provider. Also you will need to schedule a transfer of care appointment with another provider to continue care at this office. Please call the office to schedule a Transfer of Care to either Dr Charlann Lange, MD, Darleen Crocker or Kara Dies, NP.  E2C2 please schedule a physical and another appointment for a TOC . Must be 2 different appointments. Thank you.

## 2023-12-19 ENCOUNTER — Ambulatory Visit: Payer: Self-pay

## 2023-12-19 ENCOUNTER — Ambulatory Visit: Payer: Self-pay | Admitting: *Deleted

## 2023-12-19 NOTE — Telephone Encounter (Signed)
 Reason for Disposition . Patient sounds very upset or troubled to the triager  Answer Assessment - Initial Assessment Questions 1. CONCERN: Did anything happen that prompted you to call today?      Worsening anxiety since wife is divorcing him. Secretly seeing convicted felon and daughter is with ex wife , lost job and living in camper at this time. 2. ANXIETY SYMPTOMS: Can you describe how you (your loved one; patient) have been feeling? (e.g., tense, restless, panicky, anxious, keyed up, overwhelmed, sense of impending doom).      Overwhelmed, anxiety having nightmares   3. ONSET: How long have you been feeling this way? (e.g., hours, days, weeks)     Greater than 3 weeks ago  4. SEVERITY: How would you rate the level of anxiety? (e.g., 0 - 10; or mild, moderate, severe).     Moderate to severe 5. FUNCTIONAL IMPAIRMENT: How have these feelings affected your ability to do daily activities? Have you had more difficulty than usual doing your normal daily activities? (e.g., getting better, same, worse; self-care, school, work, interactions)     Affected normal daily living, had to stop working. Not eating well, is staying hydrated 6. HISTORY: Have you felt this way before? Have you ever been diagnosed with an anxiety problem in the past? (e.g., generalized anxiety disorder, panic attacks, PTSD). If Yes, ask: How was this problem treated? (e.g., medicines, counseling, etc.)     hx mental health issues with anxiety , ADD, ADHD. 7. RISK OF HARM - SUICIDAL IDEATION: Do you ever have thoughts of hurting or killing yourself? If Yes, ask:  Do you have these feelings now? Do you have a plan on how you would do this?     Not hurting self .  8. TREATMENT:  What has been done so far to treat this anxiety? (e.g., medicines, relaxation strategies). What has helped?     Nothing medication wise.  9. TREATMENT - THERAPIST: Do you have a counselor or therapist? Name?     na 10.  POTENTIAL TRIGGERS: Do you drink caffeinated beverages (e.g., coffee, colas, teas), and how much daily? Do you drink alcohol or use any drugs? Have you started any new medicines recently?       Social drinker of alcohol and uses vapes with [employer]. 11. PATIENT SUPPORT: Who is with you now? Who do you live with? Do you have family or friends who you can talk to?        Patient's son Aletha Hutching.  12. OTHER SYMPTOMS: Do you have any other symptoms? (e.g., feeling depressed, trouble concentrating, trouble sleeping, trouble breathing, palpitations or fast heartbeat, chest pain, sweating, nausea, or diarrhea)       Loss greater than 15-20 lbs. Panic attacks worsening. Brain won't shut off. Hx phobia seeing Drs. Having nightmares. 13. PREGNANCY: Is there any chance you are pregnant? When was your last menstrual period?       Na   Patient very anxious throughout call and requesting help. No c/o chest pain no elevated heart rate reported no difficulty breathing. Patient reports he has moved from his house of approx 30 years and now living in camper. Only communicating with eldest son Aletha Hutching. Hx of anxiety with seeing drs. Wanting medication for short term use not medications that he needs to take daily . Does not trust medications to make him feel different. No available appt with PCP . Scheduled appt with other provider for 12/20/23 to get proper care and referral to psych. Per  patient request. Recommended if sx worsen go to ED. Gave patient information for Urgent Crisis Center and [phone] if needed.  Protocols used: Anxiety and Panic Attack-A-AH

## 2023-12-19 NOTE — Telephone Encounter (Addendum)
 Copied from CRM [id number]. Topic: Clinical - Red Word Triage >> Dec 19, 2023  4:17 PM [place] E wrote: [facility] that prompted transfer to Nurse Triage: Increased anxiety. Patient going through some personal issues and experiencing increased anxiety.  Call incomplete

## 2023-12-19 NOTE — Telephone Encounter (Signed)
 FYI Only or Action Required?: Action required by provider  Patient was last seen in primary care on 08/28/2023 by Valli Gaw, MD. Called Nurse Triage reporting Anxiety. Symptoms began several weeks ago. Interventions attempted: Other: talks to son . Symptoms are: gradually worsening.  Triage Disposition: See Physician Within 24 Hours  Patient/caregiver understands and will follow disposition?: Yes      1. CONCERN: Did anything happen that prompted you to call today?      Worsening anxiety since wife is divorcing him. Secretly seeing convicted felon and daughter is with ex wife , lost job and living in camper at this time. 2. ANXIETY SYMPTOMS: Can you describe how you (your loved one; patient) have been feeling? (e.g., tense, restless, panicky, anxious, keyed up, overwhelmed, sense of impending doom).      Overwhelmed, anxiety having nightmares   3. ONSET: How long have you been feeling this way? (e.g., hours, days, weeks)     Greater than 3 weeks ago  4. SEVERITY: How would you rate the level of anxiety? (e.g., 0 - 10; or mild, moderate, severe).     Moderate to severe 5. FUNCTIONAL IMPAIRMENT: How have these feelings affected your ability to do daily activities? Have you had more difficulty than usual doing your normal daily activities? (e.g., getting better, same, worse; self-care, school, work, interactions)     Affected normal daily living, had to stop working. Not eating well, is staying hydrated 6. HISTORY: Have you felt this way before? Have you ever been diagnosed with an anxiety problem in the past? (e.g., generalized anxiety disorder, panic attacks, PTSD). If Yes, ask: How was this problem treated? (e.g., medicines, counseling, etc.)     hx mental health issues with anxiety , ADD, ADHD. 7. RISK OF HARM - SUICIDAL IDEATION: Do you ever have thoughts of hurting or killing yourself? If Yes, ask:  Do you have these feelings now? Do you have a plan on how you  would do this?     Not hurting self .  8. TREATMENT:  What has been done so far to treat this anxiety? (e.g., medicines, relaxation strategies). What has helped?     Nothing medication wise.  9. TREATMENT - THERAPIST: Do you have a counselor or therapist? Name?     na 10. POTENTIAL TRIGGERS: Do you drink caffeinated beverages (e.g., coffee, colas, teas), and how much daily? Do you drink alcohol or use any drugs? Have you started any new medicines recently?       Social drinker of alcohol and uses vapes with [employer]. 11. PATIENT SUPPORT: Who is with you now? Who do you live with? Do you have family or friends who you can talk to?        Patient's son Aletha Hutching.  12. OTHER SYMPTOMS: Do you have any other symptoms? (e.g., feeling depressed, trouble concentrating, trouble sleeping, trouble breathing, palpitations or fast heartbeat, chest pain, sweating, nausea, or diarrhea)       Loss greater than 15-20 lbs. Panic attacks worsening. Brain won't shut off. Hx phobia seeing Drs. Having nightmares. 13. PREGNANCY: Is there any chance you are pregnant? When was your last menstrual period?       Na   Patient very anxious throughout call and requesting help. No c/o chest pain no elevated heart rate reported no difficulty breathing. Patient reports he has moved from his house of approx 30 years and now living in camper. Only communicating with eldest son Aletha Hutching. Hx of anxiety with seeing drs. Wanting  medication for short term use not medications that he needs to take daily . Does not trust medications to make him feel different. No available appt with PCP . Scheduled appt with other provider for 12/20/23 to get proper care and referral to psych. Per patient request. Recommended if sx worsen go to ED. Gave patient information for Urgent Crisis Center and [phone] if needed.

## 2023-12-20 ENCOUNTER — Encounter: Payer: Self-pay | Admitting: Nurse Practitioner

## 2023-12-20 ENCOUNTER — Ambulatory Visit (INDEPENDENT_AMBULATORY_CARE_PROVIDER_SITE_OTHER): Payer: Self-pay | Admitting: Nurse Practitioner

## 2023-12-20 VITALS — BP 128/82 | HR 80 | Temp 98.7°F | Ht 70.0 in | Wt 196.6 lb

## 2023-12-20 DIAGNOSIS — R1032 Left lower quadrant pain: Secondary | ICD-10-CM

## 2023-12-20 DIAGNOSIS — F410 Panic disorder [episodic paroxysmal anxiety] without agoraphobia: Secondary | ICD-10-CM | POA: Diagnosis not present

## 2023-12-20 MED ORDER — HYDROXYZINE PAMOATE 25 MG PO CAPS: 25.0000 mg | ORAL_CAPSULE | Freq: Four times a day (QID) | ORAL | 0 refills | Status: DC | PRN

## 2023-12-20 NOTE — Progress Notes (Signed)
 Established Patient Office Visit  Subjective:  Patient ID: Anthony Harris, male    DOB: [birth year]  Age: 48 y.o. MRN: [id number]  CC:  Chief Complaint  Patient presents with   Acute Visit    Anxiety/ panic attacks   Discussed the use of a AI scribe software for clinical note transcription with the patient, who gave verbal consent to proceed.  HPI  The patient, with ADHD and PTSD, presents with panic attacks and anxiety. He is accompanied by his aunt.  He has experienced panic attacks and anxiety for three weeks, worsening after his wife's departure. Symptoms include shortness of breath and flashbacks. He is not on medication but has previously used Ritalin, Adderall, Zoloft, and Xanax. He has sleep disturbances, including nightmares and talking in his sleep, and did not complete a sleep study due to discomfort with in-house testing. He recently quit his job due to stress. He states is not able to cope with it. Denise suicidal ideations.     He has left inguinal pain intermittently. US  of scrotum March, 2025  IMPRESSION: 1. No evidence for testicular torsion. 2. Small left hydrocele. 3. Possible small left varicocele.     HPI   Past Medical History:  Diagnosis Date   Anxiety    Attention deficit disorder    childhood   GERD (gastroesophageal reflux disease)    Headache(784.0)    Lumbar disc herniation    Muscle spasm of back 06/21/2012   Right inguinal hernia 07/14/2013    Past Surgical History:  Procedure Laterality Date   HERNIA REPAIR     INGUINAL HERNIA REPAIR Right 07/16/2013   Procedure: HERNIA REPAIR INGUINAL ADULT;  Surgeon: Levert Ready, MD;  Location: [facility] OR;  Service: General;  Laterality: Right;   INSERTION OF MESH Right 07/16/2013   Procedure: INSERTION OF MESH;  Surgeon: Levert Ready, MD;  Location: [facility] OR;  Service: General;  Laterality: Right;   LUMBAR EPIDURAL INJECTION     Hx; of for pain   TONSILLECTOMY      Family History  Problem Relation  Age of Onset   Cancer Mother    Stroke Father    Hypercholesterolemia Father    Alcohol abuse Father    Diabetes Sister    Alcohol abuse Sister    Diabetes Brother    Hypertension Other    Cancer - Lung Other     Social History   Socioeconomic History   Marital status: Married    Spouse name: Not on file   Number of children: Not on file   Years of education: Not on file   Highest education level: 12th grade  Occupational History   Occupation: Emergency planning/management officer  Tobacco Use   Smoking status: Former    Types: Cigarettes   Smokeless tobacco: Never   Tobacco comments:    at age 79  Vaping Use   Vaping status: Never Used  Substance and Sexual Activity   Alcohol use: Yes    Comment: Social drinking   Drug use: Not Currently    Comment: No active drug use. History of past drug use.   Sexual activity: Yes  Other Topics Concern   Not on file  Social History Narrative   Patient lives in [place] with his wife.   Had education up to 10th grade.   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   Has 3 kids. All healthy.         Social Drivers  of Health   Financial Resource Strain: Not on file  Food Insecurity: Not on file  Transportation Needs: Not on file  Physical Activity: Not on file  Stress: Not on file  Social Connections: Unknown (11/18/2021)   Received from [facility]   Social Network    Social Network: Not on file  Intimate Partner Violence: Unknown (10/10/2021)   Received from [facility]   HITS    Physically Hurt: Not on file    Insult or Talk Down To: Not on file    Threaten Physical Harm: Not on file    Scream or Curse: Not on file     Outpatient Medications Prior to Visit  Medication Sig Dispense Refill   cetirizine  (ZYRTEC ) 10 MG tablet Take 1 tablet (10 mg total) by mouth daily. (Patient not taking: Reported on 12/20/2023) 30 tablet 11   No facility-administered medications prior to visit.    No Known  Allergies  ROS Review of Systems Negative unless indicated in HPI.    Objective:    Physical Exam Constitutional:      Appearance: Normal appearance.   Cardiovascular:     Rate and Rhythm: Normal rate and regular rhythm.     Pulses: Normal pulses.     Heart sounds: Normal heart sounds.  Pulmonary:     Effort: Pulmonary effort is normal.     Breath sounds: Normal breath sounds.   Neurological:     General: No focal deficit present.     Mental Status: He is oriented to person, place, and time.   Psychiatric:        Mood and Affect: Mood is anxious. Affect is tearful.        Behavior: Behavior normal. Behavior is cooperative.        Thought Content: Thought content normal. Thought content does not include suicidal plan.     BP 128/82   Pulse 80   Temp 98.7 F (37.1 C)   Ht 5' 10 (1.778 m)   Wt 196 lb 9.6 oz (89.2 kg)   SpO2 97%   BMI 28.21 kg/m  Wt Readings from Last 3 Encounters:  12/20/23 196 lb 9.6 oz (89.2 kg)  08/28/23 228 lb 8 oz (103.6 kg)  01/23/23 241 lb 12.8 oz (109.7 kg)     Health Maintenance  Topic Date Due   DTaP/Tdap/Td (2 - Tdap) 08/06/2018   Fecal DNA (Cologuard)  Never done   Zoster Vaccines- Shingrix (1 of 2) Never done   COVID-19 Vaccine (1 - 2024-25 season) 01/04/2024 (Originally 03/11/2023)   INFLUENZA VACCINE  02/08/2024   Hepatitis C Screening  Completed   HIV Screening  Completed   HPV VACCINES  Aged Out   Meningococcal B Vaccine  Aged Out    There are no preventive care reminders to display for this patient.  Lab Results  Component Value Date   TSH 1.62 01/02/2023   Lab Results  Component Value Date   WBC 7.3 01/02/2023   HGB 15.7 01/02/2023   HCT 47.9 01/02/2023   MCV 90.7 01/02/2023   PLT 279.0 01/02/2023   Lab Results  Component Value Date   NA 143 08/28/2023   K 4.7 08/28/2023   CO2 33 (H) 08/28/2023   GLUCOSE 114 (H) 08/28/2023   BUN 14 08/28/2023   CREATININE 0.94 08/28/2023   BILITOT 0.4 08/28/2023    ALKPHOS 71 08/28/2023   AST 18 08/28/2023   ALT 19 08/28/2023   PROT 7.4 08/28/2023   ALBUMIN 4.3  08/28/2023   CALCIUM 9.6 08/28/2023   ANIONGAP 13 02/04/2018   GFR 94.85 08/28/2023   Lab Results  Component Value Date   CHOL 182 01/02/2023   Lab Results  Component Value Date   HDL 59.20 01/02/2023   Lab Results  Component Value Date   LDLCALC 88 01/02/2023   Lab Results  Component Value Date   TRIG 175.0 (H) 01/02/2023   Lab Results  Component Value Date   CHOLHDL 3 01/02/2023   Lab Results  Component Value Date   HGBA1C 5.8 01/02/2023      Assessment & Plan:  Panic attack Assessment & Plan: Patient with h/o anxiety, depression, PTSD, ADHD. Experiencing panic attacks and anxiety exacerbated by personal stressors. Prefers non-medication management but agreed to try hydroxyzine  for acute symptoms. - Prescribe hydroxyzine  as needed, up to three times daily, for panic attack symptoms. -Medication s/e discussed. -Send urgent referral to psychiatrist for further mental health evaluation and management. - Advised to seek emergency care if symptoms worsen. -Advised to perform breathing exercises, meditation and journaling.   Orders: -     hydrOXYzine  Pamoate; Take 1 capsule (25 mg total) by mouth every 6 (six) hours as needed.  Dispense: 30 capsule; Refill: 0 -     Ambulatory referral to Psychiatry  Left inguinal pain Assessment & Plan: Left inguinal pain. US  IMPRESSION: 1. No evidence for testicular torsion. 2. Small left hydrocele. 3. Possible small left varicocele.  -Will refer to urology    Orders: -     Ambulatory referral to Urology    Follow-up: No follow-ups on file.   Tenise Stetler, NP

## 2023-12-20 NOTE — Assessment & Plan Note (Signed)
 Left inguinal pain. US  IMPRESSION: 1. No evidence for testicular torsion. 2. Small left hydrocele. 3. Possible small left varicocele.  -Will refer to urology

## 2023-12-20 NOTE — Assessment & Plan Note (Addendum)
 Patient with h/o anxiety, depression, PTSD, ADHD. Experiencing panic attacks and anxiety exacerbated by personal stressors. Prefers non-medication management but agreed to try hydroxyzine  for acute symptoms. - Prescribe hydroxyzine  as needed, up to three times daily, for panic attack symptoms. -Medication s/e discussed. -Send urgent referral to psychiatrist for further mental health evaluation and management. - Advised to seek emergency care if symptoms worsen. -Advised to perform breathing exercises, meditation and journaling.

## 2023-12-31 ENCOUNTER — Ambulatory Visit: Payer: Self-pay

## 2023-12-31 ENCOUNTER — Telehealth: Payer: Self-pay | Admitting: Family Medicine

## 2023-12-31 NOTE — Telephone Encounter (Signed)
 Please see phone note

## 2023-12-31 NOTE — Telephone Encounter (Signed)
 Talked with the patient on the phone who reports worsening anxiety, night mares for 1 month since separating from his wife. He was seen in our clinic on 12/20/23 and started on Hydroxyzine  25 mg, every 6 hourly prn. He reports feeling more anxious when taking Hydroxyzine , has tried it 4 times so far. Denies SI but feels angry towards his ex-wife's partner. I counseled the patient to go to [facility] for urgent care located at [address] in [place], [place] or [address] [place], [place] [address] if SI/HI for hospital evaluation. Patient reports he does not feel SI/HI and plans on going to UC with his aunt [place] . Recommend office visit for follow up once patient receives urgent evaluation.    Luke Shade, MD

## 2023-12-31 NOTE — Telephone Encounter (Signed)
 FYI Only or Action Required?: Action required by provider: clinical question for provider and update on patient condition.  Patient was last seen in primary care on 12/20/2023 by Vincente Saber, NP. Called Nurse Triage reporting Depression and Anxiety. Symptoms began several weeks ago. Interventions attempted: Prescription medications: hydroxyzine  (he states he has taken x4-5 times) and Other: tries to stay busy and be around family. Symptoms are: anxiety, depression, decreased appetite unchanged.  Triage Disposition: See PCP Within 2 Weeks  Patient/caregiver understands and will follow disposition?: Yes                   Summary: Depression   Pt's aunt ([place] ) called in checking on the status of patient's psychology referral. She is not listed on his HIPAA so I could not give her any information, but there was no referral shown. States it has been 2 weeks and she has a growing concern for his overall well-being as he is severely depressed. States he is not suicidal, but is very depressed. Pt was not there with aunt but I am requesting we call this patient to check on his well-being please.  Patient callback is [phone]       Reason for Disposition  [1] Started on anti-depressant medications < 2 weeks ago AND [2] not feeling any better  Answer Assessment - Initial Assessment Questions Patient states he has not been taking the hydroxyzine  as prescribed, he states he has taken about 4-5 pills today since prescribed. He states it felt like it was making his anxiety worse when he took the medication.    1. CONCERN: What happened that made you call today?     Patient's aunt called in concerned about patient and his mental health. Patient states he feels like nothing has worsened or changed since June 12th when he saw provider.   2. DEPRESSION SYMPTOM SCREENING: How are you feeling overall? (e.g., decreased energy, increased sleeping or difficulty sleeping, difficulty  concentrating, feelings of sadness, guilt, hopelessness, or worthlessness)     Patient states when he is by himself he feels worse, he is able to keep his mind occupied when he has family around. He states it is not good, in survival mode.  3. RISK OF HARM - SUICIDAL IDEATION:  Do you ever have thoughts of hurting or killing yourself?  (e.g., yes, no, no but preoccupation with thoughts about death)   - INTENT:  Do you have thoughts of hurting or killing yourself right NOW? (e.g., yes, no, N/A)   - PLAN: Do you have a specific plan for how you would do this? (e.g., gun, knife, overdose, no plan, N/A)     No.  4. RISK OF HARM - HOMICIDAL IDEATION:  Do you ever have thoughts of hurting or killing someone else?  (e.g., yes, no, no but preoccupation with thoughts about death)   - INTENT:  Do you have thoughts of hurting or killing someone right NOW? (e.g., yes, no, N/A)   - PLAN: Do you have a specific plan for how you would do this? (e.g., gun, knife, no plan, N/A)      No.  5. FUNCTIONAL IMPAIRMENT: How have things been going for you overall? Have you had more difficulty than usual doing your normal daily activities?  (e.g., better, same, worse; self-care, school, work, interactions)     Patient states he can do his self-care and normal activities.   6. SUPPORT: Who is with you now? Who do you live with? Do you have  family or friends who you can talk to?      He states he is living in a camper on his son's property and he is living with his dog. He has 2 sons as well as his aunt in his life as support.  7. THERAPIST: Do you have a counselor or therapist? Name?     No.  8. STRESSORS: Has there been any new stress or recent changes in your life?     Patient states he is going thru separation with wife who is seeing a new man and he has not been able to see his daughter. He states he also had to quit his jobs.  9. ALCOHOL USE OR SUBSTANCE USE (DRUG USE): Do you drink  alcohol or use any illegal drugs?     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  10. OTHER: Do you have any other physical symptoms right now? (e.g., fever)       Anxiety, decreased appetite.  11. PREGNANCY: Is there any chance you are pregnant? When was your last menstrual period?       N/A.  Protocols used: Depression-A-AH

## 2023-12-31 NOTE — Telephone Encounter (Signed)
 Copied from CRM [id number]. Topic: Referral - Status >> Dec 31, 2023  2:41 PM Berneda FALCON wrote: Reason for CRM: Aunt ([place]ia ) wants to know what is going on with the Psychology referral. States it has been 2 weeks since appt and has not heard anything yet.   States she is having growing concerns about [place] and he is not getting better. I am also going to request that NT call him again because of this growing concern.  Patient callback-[phone] please call back as soon as possible. States they were supposed to Fast track him an appt.

## 2023-12-31 NOTE — Telephone Encounter (Signed)
Attempted to call Patient - no answer and his voicemail is full

## 2023-12-31 NOTE — Telephone Encounter (Signed)
 Called Patient he states he is having panic attacks and shortness of breath. His marriage of 33 years just ended and the wife took the daughter and hid with a felon that has been convicted of multiple murders. Wife will not let him see his daughter and he has been with his wife since he was 48 years old now 52 years old. Patient states no thoughts of harming himself. Patient states he feels as if he is about to lose it. Patient is asking for a sooner appointment here or with Psych which he has an appointment with on 01/15/24.

## 2023-12-31 NOTE — Telephone Encounter (Signed)
 Called Patient and gave him the addresses and phone numbers to the behavioral health places in [place]. Patient states he lives in [place] and is going now.

## 2024-01-01 ENCOUNTER — Ambulatory Visit (HOSPITAL_COMMUNITY)
Admission: EM | Admit: 2024-01-01 | Discharge: 2024-01-01 | Disposition: A | Discharge status: Home / Self Care | Payer: Self-pay | Attending: Nurse Practitioner | Admitting: Nurse Practitioner

## 2024-01-01 DIAGNOSIS — F4323 Adjustment disorder with mixed anxiety and depressed mood: Secondary | ICD-10-CM | POA: Insufficient documentation

## 2024-01-01 DIAGNOSIS — F909 Attention-deficit hyperactivity disorder, unspecified type: Secondary | ICD-10-CM | POA: Insufficient documentation

## 2024-01-01 DIAGNOSIS — F1120 Opioid dependence, uncomplicated: Secondary | ICD-10-CM | POA: Insufficient documentation

## 2024-01-01 MED ORDER — HYDROXYZINE HCL 25 MG PO TABS: 25.0000 mg | ORAL_TABLET | Freq: Three times a day (TID) | ORAL | Status: DC | PRN

## 2024-01-01 MED ORDER — HYDROXYZINE HCL 25 MG PO TABS: 25.0000 mg | ORAL_TABLET | Freq: Three times a day (TID) | ORAL | 0 refills | Status: DC | PRN

## 2024-01-01 NOTE — Progress Notes (Signed)
   01/01/24 1122  [facility] Triage Screening (Walk-ins at [facility] only)  How Did You Hear About Us ? Primary Care  What Is the Reason for Your Visit/Call Today? Anthony Harris presents to [facility] voluntarily accompanied by his aunt. Pt states that he is going through a rough divorce (married since he was 51). Pt states that he is dealing with alot of stress, depression and anxiety. Pt states that he wants the pain to stop. Pt states that he was prescribed some medication but doesn't remember what it is called. Pt currently denies SI, HI, AVH and alcohol/drug use. Pt states that he doesn't have a therapist or psychiatrist at this time.  How Long Has This Been Causing You Problems? 1-6 months  Have You Recently Had Any Thoughts About Hurting Yourself? No  Are You Planning to Commit Suicide/Harm Yourself At This time? No  Have you Recently Had Thoughts About Hurting Someone [name]? No  Are You Planning To Harm Someone At This Time? No  Physical Abuse Denies  Verbal Abuse Denies  Sexual Abuse Denies  Exploitation of patient/patient's resources Yes, past (Comment);Yes, present (Comment)  Self-Neglect Denies  Are you currently experiencing any auditory, visual or other hallucinations? No  Have You Used Any Alcohol or Drugs in the Past 24 Hours? No  Do you have any current medical co-morbidities that require immediate attention? No  Clinician description of patient physical appearance/behavior: calm, cooperative,  What Do You Feel Would Help You the Most Today? Treatment for Depression or other mood problem  If access to [facility] was not available, would you have sought care in the Emergency Department? No  Determination of Need Routine (7 days)  Options For Referral Outpatient [facility]

## 2024-01-01 NOTE — Discharge Instructions (Signed)

## 2024-01-01 NOTE — ED Provider Notes (Signed)
 Behavioral Health Urgent Care Medical Screening Exam  Patient Name: Anthony Harris MRN: [id number] Date of Evaluation: 01/01/24 Chief Complaint:  Worsening depressive symptoms Diagnosis:  Final diagnoses:  Adjustment disorder with mixed anxiety and depressed mood   History of Present illness: Anthony Harris is a 48 y.o. male with a reported mental health diagnosis of ADHD.  As per triage: Anthony Harris presents to [facility] voluntarily accompanied by his aunt. Pt states that he is going through a rough divorce (married since he was 44). Pt states that he is dealing with alot of stress, depression and anxiety. Pt states that he wants the pain to stop. Pt states that he was prescribed some medication but doesn't remember what it is called. Pt currently denies SI, HI, AVH and alcohol/drug use. Pt states that he doesn't have a therapist or psychiatrist at this time.  Assessment: During encounter with patient, mood is dysphoric, depressed, and he recounts how 1.5 months ago, his wife left him, and moved in with somebody that she met while at work, states that the person that she met was someone who was a out of prison on 09/27/22 for several charges including 2 attempted murders and drug charges, states that he was a convicted felon. Reports that he had been married to wife for 30 yrs prior to she leaving with this person, shares that they share three children, a 38 yo son, a 39 yo son and a 40 yo daughter who also moved in with his wife. He shares that he is not talking to his daughter because she was involved in it too. When asked to elaborate on this, pt states that he was close to his daughter, and she never revealed to him that his wife was dating the jail bird, and she saw his contact information in her phone.   Pt states that he has had difficulty adapting to the fact that his marriage had ended, and he is not talking to his wife, but shares that he has protective factors in his two  sons who are supportive. He shares that he also has a grandson from his second son who is almost 50 yo old, and is able to make him smile and laugh. He share that he has another grand child from his first son on the way also, and lives on the  camp [place] in [place] owned by his first son. He shares that he second son will be moving to a home on same grounds in July.   Pt shares that since his wife left, he has had trouble functioning overall; he reports that he used to work at a [employer], and he quit. He reports trouble with sleep, states that he sleeps for 2-3 hrs per night, reports trouble with appetite, states that he has had decreased energy levels, feels more anxious, and has had trouble enjoying things that render him feeling typically happy. States that he suspects that his wife is back to using recreational drugs because she used to have an Opioid addiction in the past, and squandered all of their money when they first got together. He perseverates about other things. States his PCP told him to come, here together, states that he already has a mental health appt set on July 8th. Provided with Hydroxyzine  25 mg TID PRN, educated on the rationales, benefits, as well as possible medication side effects and verbalizes understanding.   Pt with flat affect and depressed mood, attention to personal hygiene and grooming is  fair, eye contact is good, speech is clear & coherent. Thought contents are organized and logical, and pt currently denies SI/HI/AVH or paranoia. There is no evidence of delusional thoughts.  Denies plan or intent to harm self or others. Denies access to firearms. Denies suicide attempts in the past. Denies substance use. Denies mental health hospitalizations in the past. Offered inpatient hospitalization for treatment and stabilization of depressive symptoms, but not interested in Psychotropic medication management of depressive symptoms and not meeting criteria for  involuntary commitment at this time. Discharged with education on when to seek help and provided with mental health resources.  Suicide Risk Assessment: Minimal: No identifiable suicidal ideation.  Patients presenting with some risk factors but with morbid ruminations; may be classified as minimal risk based on the severity of the depressive symptoms   Flowsheet Row ED from 01/01/2024 in [facility]  C-SSRS RISK CATEGORY No Risk    Psychiatric Specialty Exam  Presentation  General Appearance:Casual  Eye Contact:Fair  Speech:Clear and Coherent  Speech Volume:Normal  Handedness:No data recorded  Mood and Affect  Mood:Depressed; Anxious  Affect:Congruent   Thought Process  Thought Processes:Linear; Coherent  Descriptions of Associations:No data recorded Orientation:Full (Time, Place and Person)  Thought Content:Logical    Hallucinations:None  Ideas of Reference:None  Suicidal Thoughts:No  Homicidal Thoughts:No   Sensorium  Memory:Immediate Fair  Judgment:Fair  Insight:Fair   Executive Functions  Concentration:Fair  Attention Span:Fair  Recall:Fair  Fund of Knowledge:Fair  Language:Fair   Psychomotor Activity  Psychomotor Activity:Normal   Assets  Assets:Social Support   Sleep  Sleep:Poor  Number of hours: No data recorded  Physical Exam: Physical Exam Constitutional:      Appearance: Normal appearance.   Musculoskeletal:     Cervical back: Normal range of motion.   Neurological:     General: No focal deficit present.     Mental Status: He is alert and oriented to person, place, and time.    Review of Systems  Psychiatric/Behavioral:  Positive for depression. Negative for hallucinations, memory loss, substance abuse and suicidal ideas. The patient is nervous/anxious and has insomnia.   All other systems reviewed and are negative.  Blood pressure 114/83, pulse 71, temperature 98 F (36.7 C),  temperature source Oral, resp. rate 16, SpO2 100%. There is no height or weight on file to calculate BMI.  Musculoskeletal: Strength & Muscle Tone: within normal limits Gait & Station: normal Patient leans: N/A   [facility] MSE Discharge Disposition for Follow up and Recommendations: Based on my evaluation the patient does not appear to have an emergency medical condition and can be discharged with resources and follow up care in outpatient services for Medication Management and Individual Therapy Get help right away if: You have thoughts about hurting yourself or others. Get help right away if you feel like you may hurt yourself or others, or have thoughts about taking your own life. Go to your nearest emergency room or: Call 911. Call the National Suicide Prevention Lifeline at 5040210164 or 988 in the [place] This is open 24 hours a day. If you're a Veteran: Call 988 and press 1. This is open 24 hours a day. Text the [employer] at 470 281 8380. Summary Mental health is not just the absence of mental illness. It involves understanding your emotions and behaviors, and taking steps to manage them in a healthy way. If you have symptoms of mental or emotional distress, get help from family, friends, a health care provider, or a  mental health professional. Practice good mental health behaviors such as stress management skills, self-calming skills, exercise, healthy sleeping and eating, and supportive relationships. This information is not intended to replace advice given to you by your health care provider. Make sure you discuss any questions you have with your health care provider.  Education provided on the fact that if experiencing worsening of psychiatry symptoms including suicidal ideations, homicidal ideations, or having auditory/visual hallucinations, etc, to call 911, 988, come back to this location, or go to the nearest ER. Pt verbalized understanding.  Donia Snell, NP 01/01/2024,  5:06 PM

## 2024-01-02 ENCOUNTER — Telehealth: Payer: Self-pay

## 2024-01-02 NOTE — Telephone Encounter (Signed)
 Called patient per Dr Abbey to ask patient to be change to an in person appointment vs an virtual appointment. Unable to leave message due to voicemail box being full.

## 2024-01-03 ENCOUNTER — Ambulatory Visit: Payer: Self-pay

## 2024-01-08 ENCOUNTER — Encounter: Payer: BC Managed Care – PPO | Admitting: Family Medicine
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
## 2024-01-15 ENCOUNTER — Other Ambulatory Visit: Payer: Self-pay

## 2024-01-15 ENCOUNTER — Ambulatory Visit (INDEPENDENT_AMBULATORY_CARE_PROVIDER_SITE_OTHER): Payer: Self-pay | Admitting: Psychiatry

## 2024-01-15 ENCOUNTER — Encounter: Payer: Self-pay | Admitting: Psychiatry

## 2024-01-15 VITALS — BP 114/80 | HR 76 | Temp 97.2°F | Ht 70.0 in | Wt 200.4 lb

## 2024-01-15 DIAGNOSIS — F331 Major depressive disorder, recurrent, moderate: Secondary | ICD-10-CM | POA: Diagnosis not present

## 2024-01-15 DIAGNOSIS — F411 Generalized anxiety disorder: Secondary | ICD-10-CM | POA: Diagnosis not present

## 2024-01-15 DIAGNOSIS — F1290 Cannabis use, unspecified, uncomplicated: Secondary | ICD-10-CM | POA: Diagnosis not present

## 2024-01-15 DIAGNOSIS — F4310 Post-traumatic stress disorder, unspecified: Secondary | ICD-10-CM | POA: Diagnosis not present

## 2024-01-15 MED ORDER — VENLAFAXINE HCL ER 75 MG PO CP24: 75.0000 mg | ORAL_CAPSULE | Freq: Every day | ORAL | 0 refills | Status: DC

## 2024-01-15 MED ORDER — VENLAFAXINE HCL ER 37.5 MG PO CP24: 37.5000 mg | ORAL_CAPSULE | Freq: Every day | ORAL | 0 refills | Status: DC

## 2024-01-15 NOTE — Patient Instructions (Signed)
 Start venlafaxine  37.5 mg daily for one week, then 75 mg daily  Referral to CBT: [facility] Next appointment: 8/12 at 11 AM

## 2024-02-13 ENCOUNTER — Ambulatory Visit: Admitting: Urology

## 2024-02-14 ENCOUNTER — Encounter: Payer: Self-pay | Admitting: Urology

## 2024-02-15 NOTE — Progress Notes (Signed)
 No show

## 2024-02-19 ENCOUNTER — Ambulatory Visit (INDEPENDENT_AMBULATORY_CARE_PROVIDER_SITE_OTHER): Admitting: Psychiatry

## 2024-02-19 DIAGNOSIS — Z91199 Patient's noncompliance with other medical treatment and regimen due to unspecified reason: Secondary | ICD-10-CM

## 2024-03-10 ENCOUNTER — Emergency Department (HOSPITAL_COMMUNITY)
Admission: EM | Admit: 2024-03-10 | Discharge: 2024-03-11 | Disposition: A | Discharge status: Other Acute Inpatient Hospital | Payer: Self-pay | Attending: Emergency Medicine | Admitting: Emergency Medicine

## 2024-03-10 ENCOUNTER — Encounter (HOSPITAL_COMMUNITY): Payer: Self-pay

## 2024-03-10 ENCOUNTER — Other Ambulatory Visit: Payer: Self-pay

## 2024-03-10 DIAGNOSIS — F419 Anxiety disorder, unspecified: Secondary | ICD-10-CM

## 2024-03-10 DIAGNOSIS — F329 Major depressive disorder, single episode, unspecified: Secondary | ICD-10-CM

## 2024-03-10 DIAGNOSIS — M549 Dorsalgia, unspecified: Secondary | ICD-10-CM | POA: Insufficient documentation

## 2024-03-10 DIAGNOSIS — R45851 Suicidal ideations: Secondary | ICD-10-CM

## 2024-03-10 DIAGNOSIS — F332 Major depressive disorder, recurrent severe without psychotic features: Secondary | ICD-10-CM | POA: Diagnosis present

## 2024-03-10 LAB — CBC WITH DIFFERENTIAL/PLATELET
Abs Immature Granulocytes: 0.03 K/uL (ref 0.00–0.07)
Basophils Absolute: 0.1 K/uL (ref 0.0–0.1)
Basophils Relative: 0 %
Eosinophils Absolute: 0.6 K/uL — ABNORMAL HIGH (ref 0.0–0.5)
Eosinophils Relative: 5 %
HCT: 54.2 % — ABNORMAL HIGH (ref 39.0–52.0)
Hemoglobin: 17.6 g/dL — ABNORMAL HIGH (ref 13.0–17.0)
Immature Granulocytes: 0 %
Lymphocytes Relative: 15 %
Lymphs Abs: 1.7 K/uL (ref 0.7–4.0)
MCH: 29.5 pg (ref 26.0–34.0)
MCHC: 32.5 g/dL (ref 30.0–36.0)
MCV: 90.8 fL (ref 80.0–100.0)
Monocytes Absolute: 1.3 K/uL — ABNORMAL HIGH (ref 0.1–1.0)
Monocytes Relative: 11 %
Neutro Abs: 7.7 K/uL (ref 1.7–7.7)
Neutrophils Relative %: 69 %
Platelets: 316 K/uL (ref 150–400)
RBC: 5.97 MIL/uL — ABNORMAL HIGH (ref 4.22–5.81)
RDW: 13.3 % (ref 11.5–15.5)
WBC: 11.4 K/uL — ABNORMAL HIGH (ref 4.0–10.5)
nRBC: 0 % (ref 0.0–0.2)

## 2024-03-10 LAB — COMPREHENSIVE METABOLIC PANEL WITH GFR
ALT: 18 U/L (ref 0–44)
AST: 22 U/L (ref 15–41)
Albumin: 4.8 g/dL (ref 3.5–5.0)
Alkaline Phosphatase: 74 U/L (ref 38–126)
Anion gap: 14 (ref 5–15)
BUN: 13 mg/dL (ref 6–20)
CO2: 27 mmol/L (ref 22–32)
Calcium: 10.4 mg/dL — ABNORMAL HIGH (ref 8.9–10.3)
Chloride: 98 mmol/L (ref 98–111)
Creatinine, Ser: 1.06 mg/dL (ref 0.61–1.24)
GFR, Estimated: >60 mL/min (ref >60)
Glucose, Bld: 81 mg/dL (ref 70–99)
Potassium: 3.8 mmol/L (ref 3.5–5.1)
Sodium: 140 mmol/L (ref 135–145)
Total Bilirubin: 1.5 mg/dL — ABNORMAL HIGH (ref 0.0–1.2)
Total Protein: 8.3 g/dL — ABNORMAL HIGH (ref 6.5–8.1)

## 2024-03-10 LAB — ETHANOL: Alcohol, Ethyl (B): <15 mg/dL (ref <15)

## 2024-03-10 MED ORDER — IBUPROFEN 800 MG PO TABS
800.0000 mg | ORAL_TABLET | Freq: Three times a day (TID) | ORAL | Status: DC | PRN
  Administered 2024-03-11: 800 mg via ORAL
  Filled 2024-03-10: qty 1

## 2024-03-10 MED ORDER — OXYCODONE HCL 5 MG PO TABS
5.0000 mg | ORAL_TABLET | Freq: Three times a day (TID) | ORAL | Status: DC | PRN
  Administered 2024-03-10: 5 mg via ORAL
  Filled 2024-03-10: qty 1

## 2024-03-10 MED ORDER — ACETAMINOPHEN 325 MG PO TABS
650.0000 mg | ORAL_TABLET | Freq: Four times a day (QID) | ORAL | Status: DC | PRN
  Administered 2024-03-10 – 2024-03-11 (×2): 650 mg via ORAL
  Filled 2024-03-10 (×2): qty 2

## 2024-03-10 MED ORDER — DIPHENHYDRAMINE-ZINC ACETATE 2-0.1 % EX CREA
TOPICAL_CREAM | Freq: Two times a day (BID) | CUTANEOUS | Status: DC | PRN
  Filled 2024-03-10: qty 28

## 2024-03-10 NOTE — ED Provider Notes (Addendum)
 [place] [facility]MENT AT [facility] Provider Note   CSN: [id number] Arrival date & time: 03/10/24  1311     Patient presents with: Back Pain and Suicidal   Anthony Harris is a 48 y.o. male here with report of concern for depression, suicidal thoughts.  Patient reports he has a lumbar disc herniation and has chronic pain in his back that is flared up recently and he is struggling to control.  He hates coming to the doctor because he has whitecoat hypertension.  He says he been dealing with anxiety and depression since splitting or separating from his wife of many years, at the start of this year.  He has not been able to see a therapist as an outpatient.  His PCP prescribed him venlafaxine  and hydroxyzine , which he has not really taken because he does not like taking medications.  He says he did take a few ibuprofen  prior to coming to the ED because his back pain was so severe.  He is here with his aunt at the bedside.  The patient is here voluntarily seeking psychiatric help for his mental health, which he says is worse than it's ever been.  He reports not being able to sleep at night, racing thoughts, poor appetite, low energy, and suicidal thoughts without a specific plan.   HPI     Prior to Admission medications   Medication Sig Start Date End Date Taking? Authorizing Provider  hydrOXYzine  (VISTARIL ) 25 MG capsule Take 1 capsule (25 mg total) by mouth every 6 (six) hours as needed. Patient taking differently: Take 25 mg by mouth every 6 (six) hours as needed for anxiety. 12/20/23  Yes Kaur, Charanpreet, NP  ibuprofen  (ADVIL ) 200 MG tablet Take 200 mg by mouth every 6 (six) hours as needed for mild pain (pain score 1-3).   Yes [provider]  ketoconazole  (NIZORAL ) 2 % cream Apply 1 Application topically daily as needed (itching).   Yes [provider]  venlafaxine  XR (EFFEXOR -XR) 75 MG 24 hr capsule Take 1 capsule (75 mg total) by mouth daily with  breakfast. Start after completing 37.5 mg daily for one week 01/22/24 03/10/24 Yes Vickey Mettle, MD    Allergies: Patient has no known allergies.    Review of Systems  Updated Vital Signs BP (!) 116/98 (BP Location: Left Arm)   Pulse 85   Temp 98.1 F (36.7 C) (Oral)   Resp 18   Ht 5' 10 (1.778 m)   Wt 90.7 kg   SpO2 96%   BMI 28.70 kg/m   Physical Exam Constitutional:      General: He is not in acute distress. HENT:     Head: Normocephalic and atraumatic.  Eyes:     Conjunctiva/sclera: Conjunctivae normal.     Pupils: Pupils are equal, round, and reactive to light.  Cardiovascular:     Rate and Rhythm: Normal rate and regular rhythm.  Pulmonary:     Effort: Pulmonary effort is normal. No respiratory distress.  Abdominal:     General: There is no distension.     Tenderness: There is no abdominal tenderness.  Skin:    General: Skin is warm and dry.     Comments: Itchy eczematous rash on the bilateral forearms  Neurological:     General: No focal deficit present.     Mental Status: He is alert. Mental status is at baseline.  Psychiatric:        Mood and Affect: Mood normal.  Behavior: Behavior normal.     (all labs ordered are listed, but only abnormal results are displayed) Labs Reviewed  COMPREHENSIVE METABOLIC PANEL WITH GFR - Abnormal; Notable for the following components:      Result Value   Calcium 10.4 (*)    Total Protein 8.3 (*)    Total Bilirubin 1.5 (*)    All other components within normal limits  CBC WITH DIFFERENTIAL/PLATELET - Abnormal; Notable for the following components:   WBC 11.4 (*)    RBC 5.97 (*)    Hemoglobin 17.6 (*)    HCT 54.2 (*)    Monocytes Absolute 1.3 (*)    Eosinophils Absolute 0.6 (*)    All other components within normal limits  ETHANOL  URINE DRUG SCREEN    EKG: EKG Interpretation Date/Time:  Monday March 10 2024 14:57:45 EDT Ventricular Rate:  67 PR Interval:  117 QRS Duration:  100 QT  Interval:  419 QTC Calculation: 443 R Axis:   63  Text Interpretation: Sinus rhythm Borderline short PR interval Confirmed by Cottie Cough [phone]) on 03/10/2024 4:02:05 PM  Radiology: No results found.   Procedures   Medications Ordered in the ED  ibuprofen  (ADVIL ) tablet 800 mg (has no administration in time range)  oxyCODONE  (Oxy IR/ROXICODONE ) immediate release tablet 5 mg (5 mg Oral Given 03/10/24 1414)  acetaminophen  (TYLENOL ) tablet 650 mg (650 mg Oral Given 03/10/24 1414)  diphenhydrAMINE -zinc  acetate (BENADRYL ) 2-0.1 % cream (has no administration in time range)    Clinical Course as of 03/10/24 1605  Mon Mar 10, 2024  1605 TTS provider at the bedside [MT]    Clinical Course User Index [MT] Shaheen Star, Cough PARAS, MD                                 Medical Decision Making Amount and/or Complexity of Data Reviewed Labs: ordered.  Risk OTC drugs. Prescription drug management.   This patient presents to the Emergency Department with complaint of psychiatric disturbance. This involves an extensive number of treatment options, and is a complaint that carries with it a high risk of complications and morbidity.   I ordered, reviewed, and interpreted labs, including BMP and CBC.  There were no immediate, life-threatening emergencies found in this labwork.  The patient was medically cleared for TTS and psychiatric evaluation.  At this time, the patient is NOT under IVC.  Patent remains cooperative and voluntary  I ordered medication for low back pain, likely related to herniated disc. No red flag features for cauda equina syndrome.  Doubt spinal fracture.  No indication for MRI or x-ray imaging at this time.  Additional history was obtained from patient's aunt  He has a history of eczema and likely eczematous rash on bilateral flexor surface of the upper forearms which he has had in the past. This seems less consistent for an infectious process such as scabies given his  history.  PDMP reviewed - no active narcotic prescriptions noted at this time  Patient is medically cleared for psychiatric evaluation at this time.        Final diagnoses:  Suicidal ideation  Back pain, unspecified back location, unspecified back pain laterality, unspecified chronicity    ED Discharge Orders     None           Cottie Cough PARAS, MD 03/10/24 [phone]

## 2024-03-10 NOTE — Progress Notes (Signed)
 Pt has been accepted to [facility] on 03/10/2024 . Bed assignment:401-1   Pt meets inpatient criteria per Cathaleen Adam, NP   Attending Physician will be Dr. Raliegh      Report can be called to:  -Adult unit: [phone]  Pt can arrive pending labs and IVC completion   Care Team Notified: [facility] Cherylynn Ernst, RN, Cathaleen Adam, NP, Jon Ada, RN

## 2024-03-10 NOTE — ED Notes (Signed)
 Provided urinal for pt.  Unable to provide sample at this time

## 2024-03-10 NOTE — ED Triage Notes (Addendum)
 Pt presents to ED from home C/O back pain, depression. Pt endorses SI with plan. Does not discuss what the plan is. Endorses stressors at home including recent separation. Pressured speech, flight of ideas in triage.
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
## 2024-03-10 NOTE — ED Notes (Signed)
 IVC case # [id number]

## 2024-03-10 NOTE — ED Provider Notes (Signed)
  Physical Exam  BP (!) 116/98 (BP Location: Left Arm)   Pulse 85   Temp 98.1 F (36.7 C) (Oral)   Resp 18   Ht 5' 10 (1.778 m)   Wt 90.7 kg   SpO2 96%   BMI 28.70 kg/m   Physical Exam  Procedures  Procedures  ED Course / MDM   Clinical Course as of 03/10/24 1649  Mon Mar 10, 2024  1605 TTS provider at the bedside [MT]  1646 Passive SI, getting psych eval, under stress from wife leaving, here voluntarily initially, not under IVC, fu psych reqs and IVC if  Back pain chronic herniated disk  [LS]    Clinical Course User Index [LS] Rogelia Jerilynn RAMAN, MD [MT] Cottie Donnice PARAS, MD   Medical Decision Making Amount and/or Complexity of Data Reviewed Labs: ordered.  Risk OTC drugs. Prescription drug management.   ***

## 2024-03-11 ENCOUNTER — Inpatient Hospital Stay (HOSPITAL_COMMUNITY)
Admission: AD | Admit: 2024-03-11 | Discharge: 2024-03-13 | DRG: 885 | Disposition: A | Discharge status: Home / Self Care | Source: Intra-hospital

## 2024-03-11 ENCOUNTER — Encounter (HOSPITAL_COMMUNITY): Payer: Self-pay | Admitting: Psychiatry

## 2024-03-11 ENCOUNTER — Other Ambulatory Visit: Payer: Self-pay

## 2024-03-11 DIAGNOSIS — F1290 Cannabis use, unspecified, uncomplicated: Secondary | ICD-10-CM | POA: Diagnosis present

## 2024-03-11 DIAGNOSIS — Z811 Family history of alcohol abuse and dependence: Secondary | ICD-10-CM

## 2024-03-11 DIAGNOSIS — Z560 Unemployment, unspecified: Secondary | ICD-10-CM

## 2024-03-11 DIAGNOSIS — Z8249 Family history of ischemic heart disease and other diseases of the circulatory system: Secondary | ICD-10-CM

## 2024-03-11 DIAGNOSIS — F411 Generalized anxiety disorder: Secondary | ICD-10-CM | POA: Diagnosis present

## 2024-03-11 DIAGNOSIS — F329 Major depressive disorder, single episode, unspecified: Secondary | ICD-10-CM | POA: Diagnosis present

## 2024-03-11 DIAGNOSIS — Z833 Family history of diabetes mellitus: Secondary | ICD-10-CM | POA: Diagnosis not present

## 2024-03-11 DIAGNOSIS — F1490 Cocaine use, unspecified, uncomplicated: Secondary | ICD-10-CM | POA: Diagnosis present

## 2024-03-11 DIAGNOSIS — Z635 Disruption of family by separation and divorce: Secondary | ICD-10-CM | POA: Diagnosis not present

## 2024-03-11 DIAGNOSIS — Z823 Family history of stroke: Secondary | ICD-10-CM | POA: Diagnosis not present

## 2024-03-11 DIAGNOSIS — F331 Major depressive disorder, recurrent, moderate: Principal | ICD-10-CM | POA: Diagnosis present

## 2024-03-11 DIAGNOSIS — Z87891 Personal history of nicotine dependence: Secondary | ICD-10-CM | POA: Diagnosis not present

## 2024-03-11 DIAGNOSIS — F4310 Post-traumatic stress disorder, unspecified: Secondary | ICD-10-CM | POA: Diagnosis present

## 2024-03-11 DIAGNOSIS — R45851 Suicidal ideations: Secondary | ICD-10-CM | POA: Diagnosis present

## 2024-03-11 DIAGNOSIS — Z83438 Family history of other disorder of lipoprotein metabolism and other lipidemia: Secondary | ICD-10-CM

## 2024-03-11 LAB — URINE DRUG SCREEN
Amphetamines: NEGATIVE
Barbiturates: NEGATIVE
Benzodiazepines: NEGATIVE
Cocaine: POSITIVE — AB
Fentanyl: NEGATIVE
Methadone Scn, Ur: NEGATIVE
Opiates: NEGATIVE
Tetrahydrocannabinol: POSITIVE — AB

## 2024-03-11 MED ORDER — VENLAFAXINE HCL ER 75 MG PO CP24
75.0000 mg | ORAL_CAPSULE | Freq: Every day | ORAL | Status: DC
  Administered 2024-03-12 – 2024-03-13 (×2): 75 mg via ORAL
  Filled 2024-03-11: qty 1
  Filled 2024-03-11 (×2): qty 7
  Filled 2024-03-11: qty 1

## 2024-03-11 MED ORDER — MAGNESIUM HYDROXIDE 400 MG/5ML PO SUSP: 30.0000 mL | Freq: Every day | ORAL | Status: DC | PRN

## 2024-03-11 MED ORDER — FOLIC ACID 1 MG PO TABS
1.0000 mg | ORAL_TABLET | Freq: Every day | ORAL | Status: DC
  Administered 2024-03-11 – 2024-03-13 (×3): 1 mg via ORAL
  Filled 2024-03-11 (×3): qty 1

## 2024-03-11 MED ORDER — HYDROXYZINE HCL 25 MG PO TABS
25.0000 mg | ORAL_TABLET | Freq: Four times a day (QID) | ORAL | Status: DC | PRN
  Administered 2024-03-11: 25 mg via ORAL

## 2024-03-11 MED ORDER — OXYCODONE HCL 5 MG PO TABS: 5.0000 mg | ORAL_TABLET | Freq: Three times a day (TID) | ORAL | Status: DC | PRN

## 2024-03-11 MED ORDER — ACETAMINOPHEN 325 MG PO TABS
650.0000 mg | ORAL_TABLET | Freq: Four times a day (QID) | ORAL | Status: DC | PRN
  Administered 2024-03-12 – 2024-03-13 (×2): 650 mg via ORAL
  Filled 2024-03-11 (×2): qty 2

## 2024-03-11 MED ORDER — HYDROXYZINE HCL 25 MG PO TABS
ORAL_TABLET | ORAL | Status: AC
  Filled 2024-03-11: qty 1

## 2024-03-11 MED ORDER — ALUM & MAG HYDROXIDE-SIMETH 200-200-20 MG/5ML PO SUSP: 30.0000 mL | ORAL | Status: DC | PRN

## 2024-03-11 MED ORDER — HYDROXYZINE PAMOATE 25 MG PO CAPS: 25.0000 mg | ORAL_CAPSULE | Freq: Four times a day (QID) | ORAL | Status: DC | PRN

## 2024-03-11 NOTE — [facility] Suicide Risk Assessment (Signed)
 Suicide Risk Assessment  Admission Assessment    [facility] Admission Suicide Risk Assessment   Nursing information obtained from:  Patient Demographic factors:  Male, Caucasian, Low socioeconomic status, Living alone, Divorced or widowed Current Mental Status:  Suicidal ideation indicated by patient, Suicidal ideation indicated by others Loss Factors:  Loss of significant relationship Historical Factors:  NA Risk Reduction Factors:  NA  Total Time spent with patient: 45 minutes  Principal Problem: MDD (major depressive disorder) Diagnosis:  Principal Problem:   MDD (major depressive disorder)  Subjective Data: Anthony Harris is a 48 y.o Caucasian male with prior psychiatric diagnoses significant for MDD and GAD, who presents involuntarily to [facility] from [facility] health ED at [facility] for worsening depression resulting in suicidal thoughts in the context of recent separation from his wife of 31 years.  BAL less than 15, UDS positive for cocaine and marijuana.  Continued Clinical Symptoms:  Alcohol Use Disorder Identification Test Final Score (AUDIT): 0 The Alcohol Use Disorders Identification Test, Guidelines for Use in Primary Care, Second Edition.  World Science writer [facility]). Score between 0-7:  no or low risk or alcohol related problems. Score between 8-15:  moderate risk of alcohol related problems. Score between 16-19:  high risk of alcohol related problems. Score 20 or above:  warrants further diagnostic evaluation for alcohol dependence and treatment.  CLINICAL FACTORS:   Severe Anxiety and/or Agitation Depression:   Anhedonia Hopelessness Impulsivity Insomnia Severe Alcohol/Substance Abuse/Dependencies More than one psychiatric diagnosis Unstable or Poor Therapeutic Relationship  Musculoskeletal: Strength & Muscle Tone: within normal limits Gait & Station: normal Patient leans: N/A  Psychiatric Specialty  Exam:  Presentation  General Appearance:  Appropriate for Environment; Casual  Eye Contact: Good  Speech: Clear and Coherent  Speech Volume: Normal  Handedness: Right  Mood and Affect  Mood: Anxious; Depressed; Hopeless  Affect: Appropriate; Congruent; Labile; Tearful  Thought Process  Thought Processes: Coherent; Goal Directed  Descriptions of Associations:Intact  Orientation:Full (Time, Place and Person)  Thought Content:Logical  History of Schizophrenia/Schizoaffective disorder:No data recorded Duration of Psychotic Symptoms:No data recorded Hallucinations:Hallucinations: None  Ideas of Reference:None  Suicidal Thoughts:Suicidal Thoughts: No  Homicidal Thoughts:Homicidal Thoughts: No  Sensorium  Memory: Immediate Good; Recent Good  Judgment: Fair  Insight: Fair  Art therapist  Concentration: Good  Attention Span: Good  Recall: Fair  Fund of Knowledge: Fair  Language: Fair  Psychomotor Activity  Psychomotor Activity: Psychomotor Activity: Normal  Assets  Assets: Communication Skills; Desire for Improvement; Housing; Physical Health; Resilience  Sleep  Sleep: Sleep: Fair Number of Hours of Sleep: 4  Physical Exam: Physical Exam Vitals and nursing note reviewed.  Constitutional:      General: He is not in acute distress.    Appearance: He is normal weight. He is not ill-appearing.  HENT:     Head: Normocephalic.     Right Ear: External ear normal.     Left Ear: External ear normal.     Nose: Nose normal.     Mouth/Throat:     Mouth: Mucous membranes are moist.     Pharynx: Oropharynx is clear.  Eyes:     Extraocular Movements: Extraocular movements intact.  Cardiovascular:     Rate and Rhythm: Normal rate.     Pulses: Normal pulses.  Pulmonary:     Effort: Pulmonary effort is normal.  Abdominal:     Comments: Deferred  Genitourinary:    Comments: Deferred Musculoskeletal:  General: Normal range  of motion.     Cervical back: Normal range of motion.  Skin:    General: Skin is dry.  Neurological:     General: No focal deficit present.     Mental Status: He is alert and oriented to person, place, and time.  Psychiatric:        Behavior: Behavior normal.     Comments: Mood is labile    Review of Systems  Constitutional:  Negative for chills and fever.  HENT:  Negative for sore throat.   Eyes:  Negative for blurred vision.  Respiratory:  Negative for cough, sputum production, shortness of breath and wheezing.   Cardiovascular:  Negative for chest pain and palpitations.  Gastrointestinal:  Negative for abdominal pain, constipation, diarrhea, heartburn, nausea and vomiting.  Genitourinary:  Negative for dysuria.  Musculoskeletal:  Negative for falls.  Skin:  Negative for itching and rash.  Neurological:  Negative for dizziness, seizures and headaches.  Endo/Heme/Allergies:        See allergy list  Psychiatric/Behavioral:  Positive for depression and substance abuse. Negative for hallucinations and suicidal ideas. The patient is nervous/anxious and has insomnia.    Blood pressure 112/76, pulse 62, temperature (!) 97.3 F (36.3 C), temperature source Oral, resp. rate 18, height 5' 10 (1.778 m), weight 90.7 kg. Body mass index is 28.7 kg/m.  COGNITIVE FEATURES THAT CONTRIBUTE TO RISK:  Polarized thinking    SUICIDE RISK:   Severe:  Frequent, intense, and enduring suicidal ideation, specific plan, no subjective intent, but some objective markers of intent (i.e., choice of lethal method), the method is accessible, some limited preparatory behavior, evidence of impaired self-control, severe dysphoria/symptomatology, multiple risk factors present, and few if any protective factors, particularly a lack of social support.  PLAN OF CARE: Treatment Plan Summary: Daily contact with patient to assess and evaluate symptoms and progress in treatment and Medication management  Observation  Level/Precautions:  15 minute checks  Laboratory:   CBC: WBC 11.4, RBC 5.97, hemoglobin 17.6, HCT 54.2, possibly due to dehydration.  Otherwise normal Chemistry Profile: Calcium 10.4, total protein 8.3, total bilirubin 1.5, otherwise normal Folic Acid : Ordered GGT: Ordered HbAIC: Ordered TSH: Ordered Vitamin B-12: Ordered Vitamin D  25-hydroxy: Ordered HCG: Not applicable UDS: Positive for cocaine and marijuana UA: Last obtained February 2025 results negative  EKG: Sinus rhythm, ventricular rate 67, QT/QTc 419/443  Psychotherapy: Yes  Medications: See MAR  Consultations: Pending  Discharge Concerns: Safety  Estimated LOS: 3 to 7 days  Other:     Assessment: Anthony Harris is a 48 y.o Caucasian male with prior psychiatric diagnoses significant for MDD and GAD, who presents involuntarily to [facility] from [facility] health ED at [facility] for worsening depression resulting in suicidal thoughts in the context of recent separation from his wife of 31 years.  BAL less than 15, UDS positive for cocaine and marijuana.  Physician Treatment Plan for Primary Diagnosis: MDD (major depressive disorder)  Plans: Medications: --Continue Effexor  XR 24-hour capsule 75 mg p.o. daily for depression and anxiety.  May titrate as needed --Continue hydroxyzine  capsule 25 mg p.o. daily every 6 hours as needed for anxiety -- Initiate trazodone tablet 50 mg p.o. q. nightly as needed for insomnia.  Continue [facility] agitation protocol  Medication for other medical problems: --Oxycodone  immediate release 5 mg p.o. every 8 hours as needed for severe pain  Other PRN Medications  --Acetaminophen  650 mg every 6 as needed/mild pain  -  Maalox 30 mL oral every 4 as needed/digestion  -Magnesium  hydroxide 30 mL daily as needed/mild constipation   --The risks/benefits/side-effects/alternatives to this medication were discussed in detail with the patient and time was given for  questions. The patient consents to medication trial.   -- Metabolic profile and EKG monitoring obtained while on an atypical antipsychotic (BMI: Lipid Panel: HbgA1c: QTc:)   -- Encouraged patient to participate in unit milieu and in scheduled group therapies   Continue BH Agitation Protocol  --Haldol 5 mg, oral, 3 times daily as needed, mild agitation  --Benadryl  50 mg, oral, 3 times daily as needed, mild agitation                                      OR   --Haldol injection 5 mg, IM, 3 times daily as needed, moderate agitation  --Benadryl  injection 50 mg, IM, 3 times daily as needed, moderate agitation  --Ativan injection 2 mg, IM, 3 times daily as needed, moderate agitation                                      OR  --Haldol injection 10 mg, IM, 3 times daily as needed, severe agitation  --Benadryl  injection 50 mg, IM, 3 times daily as needed, severe agitation  --Ativan injection 2 mg, IM, 3 times daily as needed, severe agitation   Safety and Monitoring:  Voluntary admission to inpatient psychiatric unit for safety, stabilization and treatment  Daily contact with patient to assess and evaluate symptoms and progress in treatment  Patient's case to be discussed in multi-disciplinary team meeting  Observation Level : q15 minute checks  Vital signs: q12 hours  Precautions: suicide, but pt currently verbally contracts for safety on unit?   Discharge Planning:  Social work and case management to assist with discharge planning and identification of hospital follow-up needs prior to discharge  Estimated LOS: 5-7?days  Discharge Concerns: Need to establish Safety plan; Medication compliance and effectiveness   Discharge Goals: Return home with outpatient referrals for mental health follow-up including medication management/psychotherapy.   Long Term Goal(s): Improvement in symptoms so as ready for discharge  Short Term Goals: Ability to identify changes in lifestyle to reduce recurrence of  condition will improve, Ability to verbalize feelings will improve, Ability to disclose and discuss suicidal ideas, Ability to demonstrate self-control will improve, Ability to identify and develop effective coping behaviors will improve, Ability to maintain clinical measurements within normal limits will improve, Compliance with prescribed medications will improve, and Ability to identify triggers associated with substance abuse/mental health issues will improve  Physician Treatment Plan for Secondary Diagnosis: Principal Problem:   MDD (major depressive disorder)   GAD  I certify that inpatient services furnished can reasonably be expected to improve the patient's condition.   Ellouise JAYSON Azure, FNP 03/11/2024, 3:58 PM

## 2024-03-11 NOTE — ED Provider Notes (Signed)
 Emergency Medicine Observation Re-evaluation Note  Anthony Harris is a 48 y.o. male, seen on rounds today.  Pt initially presented to the ED for complaints of Back Pain and Suicidal Currently, the patient is asleep.  Physical Exam  BP 112/75 (BP Location: Right Arm)   Pulse 60   Temp (!) 97.3 F (36.3 C) (Oral)   Resp 18   Ht 5' 10 (1.778 m)   Wt 90.7 kg   SpO2 97%   BMI 28.70 kg/m  Physical Exam General: asleep Cardiac: asleep Lungs: asleep Psych: asleep  ED Course / MDM  EKG:EKG Interpretation Date/Time:  Monday March 10 2024 14:57:45 EDT Ventricular Rate:  67 PR Interval:  117 QRS Duration:  100 QT Interval:  419 QTC Calculation: 443 R Axis:   63  Text Interpretation: Sinus rhythm Borderline short PR interval Confirmed by Cottie Cough [phone]) on 03/10/2024 4:02:05 PM  I have reviewed the labs performed to date as well as medications administered while in observation.  Recent changes in the last 24 hours include prn psychiatric meds.  Plan  Current plan is for admission to [facility].    Freddi Hamilton, MD 03/11/24 [phone]

## 2024-03-11 NOTE — Group Note (Signed)
 LCSW Group Therapy Note   Group Date: 03/11/2024 Start Time: 1100 End Time: 1200   Participation:  did not attend  Type of Therapy:  Group Therapy  Topic:  Shining from Within:  Confidence and Self-Love Journey   Objective:  To support participants in developing confidence and self-love through self-awareness, self-compassion, and practical skills that nurture personal growth.   Group Goals Encourage self-reflection and self-acceptance by identifying personal strengths and achievements. Teach skills to challenge negative self-talk and replace it with supportive, truthful self-talk. Foster resilience and self-worth through [employer], gratitude, and self-care practices.   Summary:  This group explores the connection between confidence and self-love by guiding participants through reflection, mindset shifts, and practical tools like affirmations, strength recognition, and goal-setting. Activities are designed to promote self-compassion, build emotional resilience, and normalize the slow, patient journey of inner growth.   Therapeutic Modalities Used Cognitive Behavioral Therapy (CBT): Challenging and reframing unhelpful self-talk. Motivational Interviewing (MI): Encouraging small, achievable goals. Elements of Dialectical Behavioral Therapist (DBT):  Mindfulness and Self-Compassion: Promoting present-moment awareness and kindness toward self.   Philomina Leon O Tavita Eastham, LCSWA 03/11/2024  5:26 PM
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
## 2024-03-11 NOTE — Progress Notes (Signed)
   03/11/24 1200  Psych Admission Type (Psych Patients Only)  Admission Status Involuntary  Psychosocial Assessment  Patient Complaints Anxiety;Depression  Eye Contact Fair  Facial Expression Flat;Sad  Affect Sad  Speech Pressured  Interaction Guarded  Motor Activity Slow  Appearance/Hygiene Disheveled  Behavior Characteristics Cooperative  Mood Depressed;Despair  Thought Process  Coherency WDL  Content WDL  Delusions None reported or observed  Perception WDL  Hallucination None reported or observed  Judgment Impaired  Confusion Mild  Danger to Self  Current suicidal ideation? Passive  Self-Injurious Behavior No self-injurious ideation or behavior indicators observed or expressed   Agreement Not to Harm Self Yes  Danger to Others  Danger to Others None reported or observed

## 2024-03-11 NOTE — Progress Notes (Signed)
 Admission Note:  Pt is a 48 y.o. male admitted to [facility], IVC, for worsening depression, anxiety, and suicidal thoughts. Reports deterioration since separation from wife earlier this year, now experiencing hopelessness, worthlessness, poor appetite, insomnia, racing thoughts, and suicidal ideation without clear plan.SABRA He carries the psychiatric diagnose of PTSD, ADHD, depression, anxiety, polysubstance use, and recent marital separation presents with worsening depressive symptoms, suicidal ideation, and functional decline. All paperwork was signed and skin check completed with no abnormalities noted except, pt has multiple tattoo's all over body and when pt is anxious, he picks at his skin. Pt was oriented to the unit and shown to his room. Pt was offered fluid and nutrition. He is encouraged to notify staff of any needs or concerns. Q 15 minutes checks are started for safety. Pt remains safe on the unit.

## 2024-03-11 NOTE — Plan of Care (Signed)
  Problem: Education: Goal: Knowledge of  General Education information/materials will improve Outcome: Progressing Goal: Verbalization of understanding the information provided will improve Outcome: Progressing   Problem: Self-Concept: Goal: Level of anxiety will decrease Outcome: Progressing

## 2024-03-11 NOTE — ED Notes (Signed)
 Pt advises he voided earlier in morning and cannot provide uds at this time, will continue to encourage.

## 2024-03-11 NOTE — ED Notes (Signed)
 [facility] called pts wife, Bari to inform her that pt has been admitted to [facility]. [facility] provided contact information for [facility] and instructions on how to reach pt. Pts wife was appreciative of the call.   Chesley Holt, [facility]  03/11/24

## 2024-03-11 NOTE — Group Note (Signed)
 Date:  03/11/2024 Time:  4:05 PM  Group Topic/Focus: Discuss methods to help fall asleep and wake up  Self Care: The focus of this group is to help patients understand the importance of self-care in order to improve or restore emotional, physical, spiritual, interpersonal, and financial health.    Participation Level:  Active  Participation Quality:  Appropriate  Affect:  Appropriate  Cognitive:  Appropriate  Insight: Appropriate  Engagement in Group:  Engaged  Modes of Intervention:  Discussion  Additional Comments: Patient was appropriately engaged in group.  Saagar Tortorella D Rayland Hamed 03/11/2024, 4:05 PM

## 2024-03-11 NOTE — [facility] Group Notes (Signed)
 [facility] Group Notes:  (Nursing/MHT/Case Management/Adjunct)  Date:  03/11/2024  Time:  9:37 PM  Type of Therapy:  Psychoeducational Skills  Participation Level:  Minimal  Participation Quality:  Attentive  Affect:  Angry  Cognitive:  Lacking  Insight:  Lacking  Engagement in Group:  Developing/Improving  Modes of Intervention:  Education  Summary of Progress/Problems: Patient rated his day as a 5 out of a possible 10. His positive event for the day is that he met his new peers. His goal for the day was to simply make it through the day. He states that he is very unhappy about being a patient here and that he feels that he is in jail.   Demontray Franta S 03/11/2024, 9:37 PM

## 2024-03-11 NOTE — Tx Team (Signed)
 Initial Treatment Plan 03/11/2024 12:25 PM Anthony Harris [id number]    PATIENT STRESSORS: Financial difficulties   Marital or family conflict     PATIENT STRENGTHS: Motivation for treatment/growth  Supportive family/friends    PATIENT IDENTIFIED PROBLEMS: Depression  Anxiety  SI  Loss of long term relationship  Coping  PTSD           DISCHARGE CRITERIA:  Improved stabilization in mood, thinking, and/or behavior Reduction of life-threatening or endangering symptoms to within safe limits Verbal commitment to aftercare and medication compliance  PRELIMINARY DISCHARGE PLAN: Outpatient therapy  PATIENT/FAMILY INVOLVEMENT: This treatment plan has been presented to and reviewed with the patient, Anthony Harris.  The patient and family have been given the opportunity to ask questions and make suggestions.  [name], RN 03/11/2024, 12:25 PM

## 2024-03-11 NOTE — ED Notes (Signed)
Report called to bhh  

## 2024-03-12 ENCOUNTER — Encounter (HOSPITAL_COMMUNITY): Payer: Self-pay

## 2024-03-12 DIAGNOSIS — F331 Major depressive disorder, recurrent, moderate: Principal | ICD-10-CM

## 2024-03-12 LAB — HEMOGLOBIN A1C
Hgb A1c MFr Bld: 5.4 % (ref 4.8–5.6)
Mean Plasma Glucose: 108.28 mg/dL

## 2024-03-12 LAB — VITAMIN D 25 HYDROXY (VIT D DEFICIENCY, FRACTURES): Vit D, 25-Hydroxy: 21.11 ng/mL — ABNORMAL LOW (ref 30–100)

## 2024-03-12 LAB — GAMMA GT: GGT: 27 U/L (ref 7–50)

## 2024-03-12 LAB — TSH: TSH: 2.04 u[IU]/mL (ref 0.350–4.500)

## 2024-03-12 LAB — VITAMIN B12: Vitamin B-12: 500 pg/mL (ref 180–914)

## 2024-03-12 NOTE — Plan of Care (Signed)
   Problem: Safety: Goal: Periods of time without injury will increase Outcome: Progressing

## 2024-03-12 NOTE — BH IP Treatment Plan (Signed)
 Interdisciplinary Treatment and Diagnostic Plan Update  03/12/2024 Time of Session: 11:05 AM Anthony Harris MRN: [id number]  Principal Diagnosis: MDD (major depressive disorder)  Secondary Diagnoses: Principal Problem:   MDD (major depressive disorder)   Current Medications:  Current Facility-Administered Medications  Medication Dose Route Frequency Provider Last Rate Last Admin   acetaminophen  (TYLENOL ) tablet 650 mg  650 mg Oral Q6H PRN Motley-Mangrum, Jadeka A, PMHNP   650 mg at 03/12/24 0635   alum & mag hydroxide-simeth (MAALOX/MYLANTA) 200-200-20 MG/5ML suspension 30 mL  30 mL Oral Q4H PRN Motley-Mangrum, Jadeka A, PMHNP       folic acid  (FOLVITE ) tablet 1 mg  1 mg Oral Daily Ntuen, Tina C, FNP   1 mg at 03/12/24 9171   hydrOXYzine  (ATARAX ) tablet 25 mg  25 mg Oral Q6H PRN Motley-Mangrum, Jadeka A, PMHNP   25 mg at 03/11/24 2010   magnesium  hydroxide (MILK OF MAGNESIA) suspension 30 mL  30 mL Oral Daily PRN Motley-Mangrum, Jadeka A, PMHNP       oxyCODONE  (Oxy IR/ROXICODONE ) immediate release tablet 5 mg  5 mg Oral Q8H PRN Motley-Mangrum, Jadeka A, PMHNP       venlafaxine  XR (EFFEXOR -XR) 24 hr capsule 75 mg  75 mg Oral Q breakfast Motley-Mangrum, Jadeka A, PMHNP   75 mg at 03/12/24 9170   PTA Medications: Medications Prior to Admission  Medication Sig Dispense Refill Last Dose/Taking   hydrOXYzine  (VISTARIL ) 25 MG capsule Take 1 capsule (25 mg total) by mouth every 6 (six) hours as needed. (Patient taking differently: Take 25 mg by mouth every 6 (six) hours as needed for anxiety.) 30 capsule 0    ibuprofen  (ADVIL ) 200 MG tablet Take 200 mg by mouth every 6 (six) hours as needed for mild pain (pain score 1-3).      ketoconazole  (NIZORAL ) 2 % cream Apply 1 Application topically daily as needed (itching).      venlafaxine  XR (EFFEXOR -XR) 75 MG 24 hr capsule Take 1 capsule (75 mg total) by mouth daily with breakfast. Start after completing 37.5 mg daily for one week 30 capsule 0      Patient Stressors: Financial difficulties   Marital or family conflict    Patient Strengths: Motivation for treatment/growth  Supportive family/friends   Treatment Modalities: Medication Management, Group therapy, Case management,  1 to 1 session with clinician, Psychoeducation, Recreational therapy.   Physician Treatment Plan for Primary Diagnosis: MDD (major depressive disorder) Long Term Goal(s): Improvement in symptoms so as ready for discharge   Short Term Goals: Ability to identify changes in lifestyle to reduce recurrence of condition will improve Ability to verbalize feelings will improve Ability to disclose and discuss suicidal ideas Ability to demonstrate self-control will improve Ability to identify and develop effective coping behaviors will improve Ability to maintain clinical measurements within normal limits will improve Compliance with prescribed medications will improve Ability to identify triggers associated with substance abuse/mental health issues will improve  Medication Management: Evaluate patient's response, side effects, and tolerance of medication regimen.  Therapeutic Interventions: 1 to 1 sessions, Unit Group sessions and Medication administration.  Evaluation of Outcomes: Not Progressing  Physician Treatment Plan for Secondary Diagnosis: Principal Problem:   MDD (major depressive disorder)  Long Term Goal(s): Improvement in symptoms so as ready for discharge   Short Term Goals: Ability to identify changes in lifestyle to reduce recurrence of condition will improve Ability to verbalize feelings will improve Ability to disclose and discuss suicidal ideas Ability to demonstrate self-control will  improve Ability to identify and develop effective coping behaviors will improve Ability to maintain clinical measurements within normal limits will improve Compliance with prescribed medications will improve Ability to identify triggers associated with  substance abuse/mental health issues will improve     Medication Management: Evaluate patient's response, side effects, and tolerance of medication regimen.  Therapeutic Interventions: 1 to 1 sessions, Unit Group sessions and Medication administration.  Evaluation of Outcomes: Not Progressing   RN Treatment Plan for Primary Diagnosis: MDD (major depressive disorder) Long Term Goal(s): Knowledge of disease and therapeutic regimen to maintain health will improve  Short Term Goals: Ability to remain free from injury will improve, Ability to verbalize frustration and anger appropriately will improve, Ability to demonstrate self-control, Ability to participate in decision making will improve, Ability to verbalize feelings will improve, Ability to disclose and discuss suicidal ideas, Ability to identify and develop effective coping behaviors will improve, and Compliance with prescribed medications will improve  Medication Management: RN will administer medications as ordered by provider, will assess and evaluate patient's response and provide education to patient for prescribed medication. RN will report any adverse and/or side effects to prescribing provider.  Therapeutic Interventions: 1 on 1 counseling sessions, Psychoeducation, Medication administration, Evaluate responses to treatment, Monitor vital signs and CBGs as ordered, Perform/monitor CIWA, COWS, AIMS and Fall Risk screenings as ordered, Perform wound care treatments as ordered.  Evaluation of Outcomes: Not Progressing   LCSW Treatment Plan for Primary Diagnosis: MDD (major depressive disorder) Long Term Goal(s): Safe transition to appropriate next level of care at discharge, Engage patient in therapeutic group addressing interpersonal concerns.  Short Term Goals: Engage patient in aftercare planning with referrals and resources, Increase social support, Increase ability to appropriately verbalize feelings, Increase emotional  regulation, Facilitate acceptance of mental health diagnosis and concerns, Facilitate patient progression through stages of change regarding substance use diagnoses and concerns, Identify triggers associated with mental health/substance abuse issues, and Increase skills for wellness and recovery  Therapeutic Interventions: Assess for all discharge needs, 1 to 1 time with Social worker, Explore available resources and support systems, Assess for adequacy in community support network, Educate family and significant other(s) on suicide prevention, Complete Psychosocial Assessment, Interpersonal group therapy.  Evaluation of Outcomes: Not Progressing   Progress in Treatment: Attending groups: Yes. Participating in groups: Yes. Taking medication as prescribed: Yes. Toleration medication: Yes. Family/Significant other contact made: Yes, individual(s) contacted:  Christi Ferraiolo (significant other) [phone] Patient understands diagnosis: Yes. Discussing patient identified problems/goals with staff: Yes. Medical problems stabilized or resolved: Yes. Denies suicidal/homicidal ideation: Yes. Issues/concerns per patient self-inventory: No.  New problem(s) identified:  No  New Short Term/Long Term Goal(s):    medication stabilization, elimination of SI thoughts, development of comprehensive mental wellness plan.    Patient Goals:  I want to manage my depression better.   Discharge Plan or Barriers:  Patient recently admitted. CSW will continue to follow and assess for appropriate referrals and possible discharge planning.     Reason for Continuation of Hospitalization: Depression Medication stabilization Suicidal ideation  Estimated Length of Stay:  5 - 7 days  Last 3 [place] Suicide Severity Risk Score: Flowsheet Row [facility] (Current) [facility] from 03/10/2024 in [facility] ED from  01/01/2024 in [facility]  C-SSRS RISK CATEGORY High Risk High Risk No Risk    Last [facility] 2/9 Scores:    12/20/2023  2:10 PM 01/23/2023    3:46 PM 01/02/2023   10:28 AM  Depression screen PHQ 2/9  Decreased Interest 3 2 3   Down, Depressed, Hopeless 3 2 1   PHQ - 2 Score 6 4 4   Altered sleeping 3 3 3   Tired, decreased energy 3 2 2   Change in appetite 3 2 0  Feeling bad or failure about yourself  3 2 0  Trouble concentrating 3 2 3   Moving slowly or fidgety/restless 3 3 0  Suicidal thoughts 3 0 0  PHQ-9 Score 27 18 12   Difficult doing work/chores  Somewhat difficult Very difficult    Scribe for Treatment Team: Maxamillian Tienda O Dionysios Massman, LCSWA 03/12/2024 7:38 PM

## 2024-03-12 NOTE — Group Note (Signed)
 Date:  03/12/2024 Time:  10:22 AM  Group Topic/Focus:  Goals Group:   The focus of this group is to help patients establish daily goals to achieve during treatment and discuss how the patient can incorporate goal setting into their daily lives to aide in recovery.    Participation Level:  Active  Participation Quality:  Appropriate  Affect:  Appropriate  Cognitive:  Appropriate  Insight: Appropriate  Engagement in Group:  Engaged  Modes of Intervention:  Discussion  Additional Comments:  NA  Jc Veron A Tenisha Fleece 03/12/2024, 10:22 AM

## 2024-03-12 NOTE — BH Assessment (Signed)
(  Sleep Hours) - 7.5 (Any PRNs that were needed, meds refused, or side effects to meds)- Hydroxyzine  25 mg PO (Any disturbances and when (visitation, over night)- (Concerns raised by the patient)- Sad, Depressed, Helplessness in the beginning of the day, the evening depression was reduced.  (SI/HI/AVH)- Denies

## 2024-03-12 NOTE — Group Note (Signed)
 Date:  03/12/2024 Time:  10:30 PM  Group Topic/Focus:  Wrap-Up Group:   The focus of this group is to help patients review their daily goal of treatment and discuss progress on daily workbooks.    Participation Level:  Active  Participation Quality:  Appropriate and Sharing  Affect:  Appropriate and Flat  Cognitive:  Appropriate  Insight: Appropriate  Engagement in Group:  Engaged  Modes of Intervention:  Activity and Socialization  Additional Comments:  Patient completed wrap up group sheet. Patient rated his day a 8 until visit from wife making it a 10. Patient goal for today, be hap[py. Patient did achieve gaol. Coping skills the patient finds most helpful, talking about issues. Something the patient likes about himself, that I follow through.   Eward Mace 03/12/2024, 10:30 PM

## 2024-03-12 NOTE — Progress Notes (Signed)
   03/12/24 2200  Psych Admission Type (Psych Patients Only)  Admission Status Involuntary  Psychosocial Assessment  Patient Complaints Anxiety  Eye Contact Fair  Facial Expression Animated  Affect Anxious  Speech Logical/coherent  Interaction Assertive  Motor Activity Fidgety  Appearance/Hygiene Disheveled  Behavior Characteristics Cooperative  Mood Anxious;Pleasant  Thought Process  Coherency WDL  Content WDL  Delusions None reported or observed  Perception WDL  Hallucination None reported or observed  Judgment Impaired  Confusion None  Danger to Self  Current suicidal ideation? Denies  Self-Injurious Behavior No self-injurious ideation or behavior indicators observed or expressed   Agreement Not to Harm Self Yes  Description of Agreement verbal  Danger to Others  Danger to Others None reported or observed

## 2024-03-12 NOTE — Plan of Care (Signed)
  Problem: Education: Goal: Knowledge of [place] [facility] Education information/materials will improve 03/12/2024 0123 by Claudene Bronwyn DASEN, RN Outcome: Progressing 03/12/2024 0123 by Claudene Bronwyn DASEN, RN Outcome: Progressing Goal: Emotional status will improve 03/12/2024 0123 by Claudene Bronwyn DASEN, RN Outcome: Progressing 03/12/2024 0123 by Claudene Bronwyn DASEN, RN Outcome: Progressing Goal: Mental status will improve 03/12/2024 0123 by Claudene Bronwyn DASEN, RN Outcome: Progressing 03/12/2024 0123 by Claudene Bronwyn DASEN, RN Outcome: Progressing Goal: Verbalization of understanding the information provided will improve 03/12/2024 0123 by Claudene Bronwyn DASEN, RN Outcome: Progressing 03/12/2024 0123 by Claudene Bronwyn DASEN, RN Outcome: Progressing

## 2024-03-12 NOTE — Progress Notes (Signed)
   03/12/24 0800  Psych Admission Type (Psych Patients Only)  Admission Status Involuntary  Psychosocial Assessment  Patient Complaints Anxiety;Depression  Eye Contact Fair  Facial Expression Flat;Sad  Affect Anxious;Sad  Speech Soft  Interaction Evasive  Motor Activity Other (Comment) (wnl)  Appearance/Hygiene Disheveled  Behavior Characteristics Calm  Mood Pleasant  Thought Process  Coherency WDL  Content WDL  Delusions None reported or observed  Perception WDL  Hallucination None reported or observed  Judgment Impaired  Confusion None  Danger to Self  Current suicidal ideation? Passive  Agreement Not to Harm Self Yes  Description of Agreement Verbal Agreement  Danger to Others  Danger to Others None reported or observed

## 2024-03-12 NOTE — Plan of Care (Signed)
   Problem: Activity: Goal: Interest or engagement in activities will improve Outcome: Progressing   Problem: Coping: Goal: Ability to verbalize frustrations and anger appropriately will improve Outcome: Progressing   Problem: Safety: Goal: Periods of time without injury will increase Outcome: Progressing

## 2024-03-12 NOTE — [facility] Suicide Risk Assessment (Signed)
 [facility] INPATIENT:  Family/Significant Other Suicide Prevention Education  Suicide Prevention Education:  Education Completed; Anthony Harris (significant other) [phone],  (name of family member/significant other) has been identified by the patient as the family member/significant other with whom the patient will be residing, and identified as the person(s) who will aid the patient in the event of a mental health crisis (suicidal ideations/suicide attempt).  With written consent from the patient, the family member/significant other has been provided the following suicide prevention education, prior to the and/or following the discharge of the patient.  The suicide prevention education provided includes the following: Suicide risk factors Suicide prevention and interventions National Suicide Hotline telephone number [facility] assessment telephone number [facility] Assistance 911 [facility] and/or Residential Mobile Crisis Unit telephone number  Request made of family/significant other to: Remove weapons (e.g., guns, rifles, knives), all items previously/currently identified as safety concern.   Remove drugs/medications (over-the-counter, prescriptions, illicit drugs), all items previously/currently identified as a safety concern.   Anthony agrees to assist family in confiscating patient's firearms and illicit drugs from his home. Arline is also aware of who to contact in the case of a mental health emergency.  The family member/significant other verbalizes understanding of the suicide prevention education information provided.  The family member/significant other agrees to remove the items of safety concern listed above.  Louetta Lame 03/12/2024, 3:51 PM

## 2024-03-12 NOTE — Progress Notes (Signed)
   03/11/24 2025  Psych Admission Type (Psych Patients Only)  Admission Status Involuntary  Psychosocial Assessment  Patient Complaints Anxiety;Depression  Eye Contact Fair  Facial Expression Flat;Sad  Affect Anxious;Sad  Speech Soft  Interaction Evasive  Motor Activity Other (Comment) (WNL)  Appearance/Hygiene Disheveled  Behavior Characteristics Calm  Mood Pleasant  Thought Process  Coherency WDL  Content WDL  Delusions None reported or observed  Perception WDL  Hallucination None reported or observed  Judgment Impaired  Confusion None  Danger to Self  Current suicidal ideation? Passive (Denies)  Agreement Not to Harm Self Yes  Description of Agreement Notify Staff  Danger to Others  Danger to Others None reported or observed

## 2024-03-12 NOTE — Progress Notes (Signed)
 [facility] Inpatient Psychiatry Progress Note  Date: 03/12/24 Patient: Anthony Harris MRN: [id number]  Assessment and Plan: [name] is a 48 y.o. male admitted for passive suicidal ideation in the context of separation from his wife and increasing depressive symptoms.   9/3 - Patient appears genuine in his report of recently experiencing significant depressive symptoms and stress along with passive suicidal ideation, but no actual active intent. He admits to prior episodes of suicidal gestures with firearms and making repeated suicidal statements to his wife. Of note, his wife was documented to report that he has been emotionally manipulative in the past and this behavior is likely consistent with this. While he is experiencing significant stressors there does not appear to be any direct indicators of imminent danger to self (recent attempt, direct genuine communication, preparatory behavior). As such, there does not appear to be sufficient evidence to justify further involuntary detention and he will be discharged home tomorrow morning with outpatient referrals and follow-up.  # MDD (major depressive disorder), recurrent, moderate # GAD - Venlafaxine  ER 75 mg daily - Hydroxyzine  25 mg q6h prn anxiety    Risk Assessment - Low. No evidence of imminent danger to self  Discharge Planning Barriers to discharge: Outpatient follow-up.  Estimated length of stay: Tomorrow Predicted Discharge location: Home     Interval History and update: Chart reviewed. No significant events overnight. Patient slept well last night. He has been compliant with venlafaxine  and denies any side effects. We discussed the separation and the reasons leading to this admission. He explained that he was feeling depressed, despondent, and was having thoughts of being better off dead at initial presentation in the ED, but he adamantly denies that he was experiencing, or has ever experienced,  active suicidal ideation. He denies any history of attempts. We discussed his wife's report of the incidents with the firearms and these were not recent, and he does not deny them. He reports that his friend has since secured the firearms and has them at his house. He reports that he has been living in his RV on his Aunt's land and plans on returning there when he leaves the hospital. He states that he is currently unemployed but has means to make money and can subsist on his savings for now. He shares that he still has hopes of working things out with his wife and repairing ties with his family.      Physical Exam MSK/Neuro - normal gait and station Mental Status Exam Appearance - Casually dressed, appropriate hygiene Attitude - Friendly, polite Speech - normal volume, prosody, inflection Mood - Better Affect - Full Thought Process - LLGD Thought Content - No delusional TC expressed SI/HI - Denies Perceptions - Denies; not RIS Judgement/Insight - Fair Fund of knowledge - WNL Language - No impairments      Lab Results:  Admission on 03/11/2024  Component Date Value Ref Range Status   Vitamin B-12 03/12/2024 500  180 - 914 pg/mL Final   GGT 03/12/2024 27  7 - 50 U/L Final   Hgb A1c MFr Bld 03/12/2024 5.4  4.8 - 5.6 % Final   Mean Plasma Glucose 03/12/2024 108.28  mg/dL Final   TSH 90/96/7974 2.040  0.350 - 4.500 uIU/mL Final   Vit D, 25-Hydroxy 03/12/2024 21.11 (L)  30 - 100 ng/mL Final     Vitals: Blood pressure 104/86, pulse 77, temperature 98 F (36.7 C), temperature source Oral, resp. rate 18, height 5' 10 (1.778  m), weight 90.7 kg, SpO2 100%.    Anthony DELENA Salmon, DO

## 2024-03-12 NOTE — Group Note (Signed)
 Date:  03/12/2024 Time:  5:40 PM  Group Topic/Focus:  Dimensions of Wellness:   The focus of this group is to introduce the topic of wellness and discuss the role each dimension of wellness plays in total health.    Participation Level:  Active  Participation Quality:  Appropriate  Affect:  Appropriate  Cognitive:  Appropriate  Insight: Appropriate  Engagement in Group:  Engaged  Modes of Intervention:  Discussion    Anthony Harris  [name] 03/12/2024, 5:40 PM

## 2024-03-12 NOTE — BHH Group Notes (Signed)
 Spiritual care group on grief and loss facilitated by Chaplain Rockie Sofia, Bcc  Group Goal: Support / Education around grief and loss  Members engage in facilitated group support and psycho-social education.  Group Description:  Following introductions and group rules, group members engaged in facilitated group dialogue and support around topic of loss, with particular support around experiences of loss in their lives. Group Identified types of loss (relationships / self / things) and identified patterns, circumstances, and changes that precipitate losses. Reflected on thoughts / feelings around loss, normalized grief responses, and recognized variety in grief experience. Group encouraged individual reflection on safe space and on the coping skills that they are already utilizing.  Group drew on Adlerian / Rogerian and narrative framework  Patient Progress: Anthony Harris attended group and actively engaged and participated in group conversation and activities.

## 2024-03-12 NOTE — Group Note (Signed)
 Recreation Therapy Group Note   Group Topic:Problem Solving  Group Date: 03/12/2024 Start Time: 0935 End Time: 1005 Facilitators: Dyquan Minks-McCall, LRT,CTRS Location: [address] Dayroom   Group Topic: Communication, Team Building, Problem Solving  Goal Area(s) Addresses:  Patient will effectively work with peer towards shared goal.  Patient will identify skills used to make activity successful.  Patient will identify how skills used during activity can be used to reach post d/c goals.   Behavioral Response: Engaged  Intervention: STEM Activity  Activity: Stage manager. In teams of 3-5, patients were given 12 plastic drinking straws and an equal length of masking tape. Using the materials provided, patients were asked to build a landing pad to catch a golf ball dropped from approximately 5 feet in the air. All materials were required to be used by the team in their design. LRT facilitated post-activity discussion.  Education: Pharmacist, community, Scientist, physiological, Discharge Planning   Education Outcome: Acknowledges education/In group clarification offered/Needs additional education.    Affect/Mood: Appropriate   Participation Level: Engaged   Participation Quality: Independent   Behavior: Appropriate   Speech/Thought Process: Focused   Insight: Good   Judgement: Good   Modes of Intervention: STEM Activity   Patient Response to Interventions:  Engaged   Education Outcome:  In group clarification offered    Clinical Observations/Individualized Feedback: Pt was engaging and interactive with peers. Pt offered suggestions and was hands on in putting the structure together. Pt was bright and determined throughout group.      Plan: Continue to engage patient in RT group sessions 2-3x/week.   Bernie Ransford-McCall, LRT,CTRS 03/12/2024 12:42 PM

## 2024-03-12 NOTE — Group Note (Signed)
 Date:  03/12/2024 Time:  10:20 AM  Group Topic/Focus:  Goals Group:   The focus of this group is to help patients establish daily goals to achieve during treatment and discuss how the patient can incorporate goal setting into their daily lives to aide in recovery.    Participation Level:  Active  Participation Quality:  Appropriate  Affect:  Appropriate  Cognitive:  Appropriate  Insight: Appropriate  Engagement in Group:  Engaged  Modes of Intervention:  Discussion  Additional Comments:  NA  Assunta Pupo A Tamantha Saline 03/12/2024, 10:20 AM

## 2024-03-13 MED ORDER — VENLAFAXINE HCL ER 75 MG PO CP24: 75.0000 mg | ORAL_CAPSULE | Freq: Every day | ORAL | 0 refills | Status: DC

## 2024-03-13 NOTE — BHH Group Notes (Signed)
 Adult Psychoeducational Group Note  Date:  03/13/2024 Time:  9:50 AM  Group Topic/Focus:  Goals Group:   The focus of this group is to help patients establish daily goals to achieve during treatment and discuss how the patient can incorporate goal setting into their daily lives to aide in recovery.  Orientation:   The focus of this group is to educate the patient on the purpose and policies of crisis stabilization and provide a format to answer questions about their admission.  The group details unit policies and expectations of patients while admitted.  Participation Level:  Did Not Attend   Additional Comments:  Pt was invited but did not attend orientation/goals group.  Niel CHRISTELLA Nightingale 03/13/2024, 9:50 AM
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
## 2024-03-13 NOTE — BHH Suicide Risk Assessment (Signed)
 [facility] Discharge Suicide Risk Assessment   Principal Problem: MDD (major depressive disorder) Discharge Diagnoses: Principal Problem:   MDD (major depressive disorder)   Total Time spent with patient: 45 minutes   Demographic Factors:  Male, Caucasian, Low socioeconomic status, and Living alone  Loss Factors: Loss of significant relationship and Financial problems/change in socioeconomic status  Historical Factors: Impulsivity  Risk Reduction Factors:   Responsibility to family members  Continued Clinical Symptoms:  Depression, alcohol use  Cognitive Features That Contribute To Risk:  None    Suicide Risk:  Mild:  Suicidal ideation of limited frequency, intensity, duration, and specificity.  There are no identifiable plans, no associated intent, mild dysphoria and related symptoms, good self-control (both objective and subjective assessment), few other risk factors, and identifiable protective factors, including available and accessible social support.   Follow-up Information     [facility]. Go on 03/19/2024.   Specialty: Behavioral Health Why: Please go to this provider on 03/19/24 at 7:00 am for an assessment, to obtain medication management services.  You may also go Monday through Friday, arrive by 7:00 am. Contact information: [address] [place]  [address] [phone]        [facility]. Go on 03/17/2024.   Specialty: Professional Counselor Why: Please go to this provider on 03/17/24 at 9:00 am for an assessment, to obtain therapy services.  You may also go on Monday through Friday, from 9 am to 1 pm. Contact information: [address] [place] [address] [phone]                 Plan Of Care/Follow-up recommendations:  See DC summary  Oliva DELENA Salmon, DO 03/13/2024, 10:02 AM

## 2024-03-13 NOTE — Transportation (Signed)
 03/13/2024  Atari Novick Gonsoulin DOB: 05-24-1976 MRN: [id number]   RIDER WAIVER AND RELEASE OF LIABILITY  For the purposes of helping with transportation needs, [facility] partners with outside transportation providers (taxi companies, [employer], Catering manager.) to give [employer] patients or other approved people the choice of on-demand rides Public librarian) to our buildings for non-emergency visits.  By using [employer], I, the person signing this document, on behalf of myself and/or any legal minors (in my care using the [employer]), agree:  Science writer given to me are supplied by independent, outside transportation providers who do not work for, or have any affiliation with, [employer]. [facility] is not a transportation company. [facility] has no control over the quality or safety of the rides I get using [employer]. [facility] has no control over whether any outside ride will happen on time or not. [facility] gives no guarantee on the reliability, quality, safety, or availability on any rides, or that no mistakes will happen. I know and accept that traveling by vehicle (car, truck, SVU, fleeta, bus, taxi, etc.) has risks of serious injuries such as disability, being paralyzed, and death. I know and agree the risk of using [employer] is mine alone, and not [employer]. [employer] are provided as is and as are available. The transportation providers are in charge for all inspections and care of the vehicles used to provide these rides. I agree not to take legal action against [facility], its agents, employees, officers, directors, representatives, insurers, attorneys, assigns, successors, subsidiaries, and affiliates at any time for any reasons related directly or indirectly to using [employer]. I also agree not to take legal action against [facility] or its affiliates for any injury, death, or damage to property caused by or related to using  [employer]. I have read this Waiver and Release of Liability, and I understand the terms used in it and their legal meaning. This Waiver is freely and voluntarily given with the understanding that my right (or any legal minors) to legal action against [facility] relating to [employer] is knowingly given up to use these services.   I attest that I read the Ride Waiver and Release of Liability to Elsie BROCKS Neas, gave Mr. Zobrist the opportunity to ask questions and answered the questions asked (if any). I affirm that Keisean C Flye then provided consent for assistance with transportation.

## 2024-03-13 NOTE — Progress Notes (Signed)
(  Sleep Hours) - 7.75 (Any PRNs that were needed, meds refused, or side effects to meds)-  None (Any disturbances and when (visitation, over night)- None (Concerns raised by the patient)- None (SI/HI/AVH)- Denies, contracts for safety
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
## 2024-03-13 NOTE — Progress Notes (Signed)
 Patient discharged off unit at 1105. Patient belongings reviewed and acknowledged by patient. AVS and Transition Record reviewed and acknowledged by patient. Safety plan completed by patient and copy provided. Patient denies SI, plan or intent. Denies HI. Denies AVH. No observed or reported side effects to medication. No observed or reported agitation, aggression, or other acute emotional distress. No observed or reported physical abnormalities or concerns. Patient transportation from facility verified and observed.

## 2024-03-13 NOTE — Progress Notes (Signed)
  [facility] Adult Case Management Discharge Plan :  Will you be returning to the same living situation after discharge:  Yes,  patient will be returning to home on aunt's property located at [address] [place], [place] [address]  At discharge, do you have transportation home?: No. Patient will be returning home through a taxi voucher with [employer] at 11 AM. Do you have the ability to pay for your medications: No. Patient will receive samples from pharmacist upon discharge.  Release of information consent forms completed and in the chart;  Patient's signature needed at discharge.  Patient to Follow up at:  Follow-up Information     [facility]. Go on 03/19/2024.   Specialty: Behavioral Health Why: Please go to this provider on 03/19/24 at 7:00 am for an assessment, to obtain medication management services.  You may also go Monday through Friday, arrive by 7:00 am. Contact information: [address] [place]  [address] [phone]        [facility] Go on 03/17/2024.   Specialty: Professional Counselor Why: Please go to this provider on 03/17/24 at 9:00 am for an assessment, to obtain therapy services.  You may also go on Monday through Friday, from 9 am to 1 pm. Contact information: [address] [place]  [address] [place] [address] [phone]                 Next level of care provider has access to [facility] Link:no  Safety Planning and Suicide Prevention discussed: Yes,  completed with significant other, Christi Mandel (significant other) [phone].      Has patient been referred to the Quitline?: Patient refused referral for treatment  Patient has been referred for addiction treatment: No known substance use disorder.  Louetta Lame, LCSWA 03/13/2024, 9:48 AM

## 2024-03-14 ENCOUNTER — Other Ambulatory Visit: Payer: Self-pay

## 2024-03-14 DIAGNOSIS — F410 Panic disorder [episodic paroxysmal anxiety] without agoraphobia: Secondary | ICD-10-CM

## 2024-03-14 MED ORDER — HYDROXYZINE PAMOATE 25 MG PO CAPS: 25.0000 mg | ORAL_CAPSULE | Freq: Four times a day (QID) | ORAL | 0 refills | Status: DC | PRN
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
## 2024-03-18 ENCOUNTER — Ambulatory Visit (INDEPENDENT_AMBULATORY_CARE_PROVIDER_SITE_OTHER): Payer: Self-pay | Admitting: Physician Assistant

## 2024-03-18 ENCOUNTER — Encounter (HOSPITAL_COMMUNITY): Payer: Self-pay | Admitting: Physician Assistant

## 2024-03-18 VITALS — BP 122/88 | HR 62 | Temp 97.9°F | Ht 70.0 in | Wt 197.6 lb

## 2024-03-18 DIAGNOSIS — F410 Panic disorder [episodic paroxysmal anxiety] without agoraphobia: Secondary | ICD-10-CM

## 2024-03-18 DIAGNOSIS — F411 Generalized anxiety disorder: Secondary | ICD-10-CM

## 2024-03-18 DIAGNOSIS — F4310 Post-traumatic stress disorder, unspecified: Secondary | ICD-10-CM

## 2024-03-18 DIAGNOSIS — F331 Major depressive disorder, recurrent, moderate: Secondary | ICD-10-CM

## 2024-03-18 MED ORDER — HYDROXYZINE PAMOATE 50 MG PO CAPS: 50.0000 mg | ORAL_CAPSULE | Freq: Four times a day (QID) | ORAL | 2 refills | Status: DC | PRN

## 2024-03-18 MED ORDER — VENLAFAXINE HCL ER 75 MG PO CP24: 75.0000 mg | ORAL_CAPSULE | Freq: Every day | ORAL | 2 refills | Status: DC
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
## 2024-04-03 ENCOUNTER — Telehealth (HOSPITAL_COMMUNITY): Payer: Self-pay

## 2024-04-14 NOTE — Progress Notes (Deleted)
 BH MD/PA/NP OP Progress Note  04/14/2024 2:22 PM Anthony Harris  MRN:  [id number]  Visit Diagnosis: No diagnosis found.  Assessment:   Anthony Harris is a 48 year old male with a past psychiatric history significant for depression and anxiety who presents to [facility], accompanied by his ex-wife Gery [place], [phone]), to establish psychiatric care and for medication management.    Patient presents to the encounter requesting refills on his medications.   Per chart review, patient presented involuntarily to [facility] on 03/11/2024 from [place] [facility] for worsening depression resulting in suicidal thoughts in the context of recent separation from his wife of 31 years.  During his assessment, patient's UDS was positive for cocaine and marijuana.  Patient was discharged on 03/13/2024 on the following psychiatric medications:   Venlafaxine  XR (Effexor  XR) 75 mg 24-hour capsule daily with breakfast Hydroxyzine  50 mg every 6 hours as needed   Patient reports that his depression has been minimal but states that he continues to endorse anxiety.  Patient attributes his anxiety to currently going through a separation with his ex-wife.  Patient also states that he experiences nightmares attributed to his separation from his ex-wife.  A PHQ-9 screen was performed with the patient scoring a 26.  A GAD-7 screen was also performed with the patient scoring a 21.  Patient endorses stability on his current medication regimen and would like to continue taking his medications as prescribed.   Patient is currently being seen by a therapist outside of this facility.  Risk Assessment: An assessment of suicide and violence risk factors was performed as part of this evaluation and is not significantly changed from the last visit. While future psychiatric events cannot be accurately predicted, the patient does not currently require acute  inpatient psychiatric care and does not currently meet [facility]  involuntary commitment criteria. Patient was given contact information for crisis resources, behavioral health clinic and was instructed to call 911 for emergencies.   Damareon C Njoku presents for follow-up evaluation. Today, 04/14/24, patient reports ***  1. Panic attack   - hydrOXYzine  (VISTARIL ) 50 MG capsule; Take 1 capsule (50 mg total) by mouth every 6 (six) hours as needed for anxiety.  Dispense: 120 capsule; Refill: 2   2. PTSD (post-traumatic stress disorder) (Primary)   - venlafaxine  XR (EFFEXOR -XR) 75 MG 24 hr capsule; Take 1 capsule (75 mg total) by mouth daily with breakfast.  Dispense: 30 capsule; Refill: 2   3. MDD (major depressive disorder), recurrent episode, moderate (HCC)   - venlafaxine  XR (EFFEXOR -XR) 75 MG 24 hr capsule; Take 1 capsule (75 mg total) by mouth daily with breakfast.  Dispense: 30 capsule; Refill: 2   4. GAD (generalized anxiety disorder)   - venlafaxine  XR (EFFEXOR -XR) 75 MG 24 hr capsule; Take 1 capsule (75 mg total) by mouth daily with breakfast.  Dispense: 30 capsule; Refill: 2    Chief Complaint: No chief complaint on file.  HPI:  Anthony Harris is a 48 year old male with a past psychiatric history significant for depression and anxiety who presents to [facility], accompanied by his ex-wife Gery [place], [phone]), to establish psychiatric care and for medication management.   Patient presents to the encounter requesting refills on his medications.  Patient reports that he was recently admitted to [facility] due to depression and anxiety.  He reports that he was placed on venlafaxine  XR 75 mg daily as  well as hydroxyzine .  Patient denies experiencing any adverse side effects associated with his medications and states that his medications are keeping him stable.   Per chart review, patient presented  involuntarily to [facility] on 03/11/2024 from [place] [facility] for worsening depression resulting in suicidal thoughts in the context of recent separation from his wife of 31 years.  During his assessment, patient's UDS was positive for cocaine and marijuana.  Patient was discharged on 03/13/2024 on the following psychiatric medications:   Venlafaxine  XR (Effexor  XR) 75 mg 24-hour capsule daily with breakfast Hydroxyzine  50 mg every 6 hours as needed   Since being discharged from [facility], patient endorses minimal depression.  He still continues to endorse anxiety he rates a 5 out of 10.  He reports that his anxiety is accompanied by muscle tension, restlessness, and nervousness.  Patient reports that he often fidgets and feels like he is crawling out of his skin.  Patient denies any new stressors at this time. In addition to his anxiety, patient reports that he has been having nightmares every other night.  Patient states that his nightmares are attributed to going through his separation from his ex-wife.   Prior to his most recent hospitalization, patient reports that he experienced panic attacks.  Patient's panic attacks are characterized by the following symptoms: shortness of breath feeling claustrophobic, and not being able to think clearly.  He reports that in the past month he has been emotional and will often lose track of himself. In regards to his use of hydroxyzine , patient reports that the medication does come calm him down readily.  He also reports that he has a history of being noncompliant with his medication.  Patient's ex-wife does not want him to relapse in regards to his mood.   Patient endorses a past history of hospitalization due to mental health and was last hospitalized on 03/11/2024.  Patient endorses a past history of suicide attempt stating that he has threatened to harm himself in the past and has also provoked fights to get someone to harm him.  A PHQ-9 screen was  performed with the patient scoring a 26.  A GAD-7 screen was also performed with the patient scoring a 21.   Patient is alert and oriented x 4, calm, cooperative, and fully engaged in conversation during the encounter.  Patient describes his mood as sad.  Patient exhibits depressed mood with congruent affect.  Patient denies suicidal or homicidal ideations.  He further denies auditory or visual hallucinations and does not appear to be responding to internal/external stimuli.  Patient denies paranoia or delusional thoughts.  Patient endorses poor sleep and receives on average 3 to 4 hours of sleep per night.  Patient endorses poor appetite and eats on average 1 meal per day.  Patient endorses alcohol consumption stating that he is a social drinker.  Patient denies tobacco use or illicit drug use   Past Psychiatric History:  Patient has past psychiatric history significant for major depressive disorder, generalized anxiety disorder, and PTSD.   Patient was recently hospitalized due to mental health. - Patient was admitted to [facility] on 03/11/2024 due to worsening depression resulting in suicidal thoughts in the context of recent separation from his wife of 50 years old.   Patient reports that he attempted suicide 6 months ago. Prior to his most recent hospitalization, patient reports that he threatened to harm himself. He reports that he has tried provoking fights to get someone to harm  him.   Patient denies a past history of homicide attempt.  Past Medical History:  Past Medical History:  Diagnosis Date   Anxiety    Attention deficit disorder    childhood   GERD (gastroesophageal reflux disease)    Headache(784.0)    Lumbar disc herniation    Muscle spasm of back 06/21/2012   Right inguinal hernia 07/14/2013    Past Surgical History:  Procedure Laterality Date   HERNIA REPAIR     INGUINAL HERNIA REPAIR Right 07/16/2013   Procedure: HERNIA REPAIR INGUINAL ADULT;   Surgeon: Elon CHRISTELLA Pacini, MD;  Location: [facility] OR;  Service: General;  Laterality: Right;   INSERTION OF MESH Right 07/16/2013   Procedure: INSERTION OF MESH;  Surgeon: Elon CHRISTELLA Pacini, MD;  Location: [facility] OR;  Service: General;  Laterality: Right;   LUMBAR EPIDURAL INJECTION     Hx; of for pain   TONSILLECTOMY      Family Psychiatric History:  Grandmother (paternal) - mental health/possibly bipolar disorder Father - possibly depression Brother - possibly depression  Family History:  Family History  Problem Relation Age of Onset   Cancer Mother    Stroke Father    Hypercholesterolemia Father    Alcohol abuse Father    Diabetes Sister    Alcohol abuse Sister    Diabetes Brother    Hypertension Other    Cancer - Lung Other     Social History:  Social History   Socioeconomic History   Marital status: Legally Separated    Spouse name: Not on file   Number of children: 3   Years of education: Not on file   Highest education level: 9th grade  Occupational History   Occupation: Emergency planning/management officer  Tobacco Use   Smoking status: Former    Types: Cigarettes   Smokeless tobacco: Never   Tobacco comments:    at age 22  Vaping Use   Vaping status: Every Day   Devices: THC  Substance and Sexual Activity   Alcohol use: Yes    Comment: Social drinking   Drug use: Not Currently    Comment: No active drug use. History of past drug use.   Sexual activity: Yes  Other Topics Concern   Not on file  Social History Narrative   Patient lives in [place] with his wife.   Had education up to 10th grade.   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   Has 3 kids. All healthy.         Social Drivers of Corporate investment banker Strain: Not on file  Food Insecurity: No Food Insecurity (03/11/2024)   Hunger Vital Sign    Worried About Running Out of Food in the Last Year: Never true    Ran Out of Food in the Last Year: Never true  Transportation Needs: No  Transportation Needs (03/11/2024)   PRAPARE - Administrator, Civil Service (Medical): No    Lack of Transportation (Non-Medical): No  Physical Activity: Not on file  Stress: Not on file  Social Connections: Unknown (11/18/2021)   Received from [facility]   Social Network    Social Network: Not on file    Allergies: No Known Allergies  Current Medications: Current Outpatient Medications  Medication Sig Dispense Refill   hydrOXYzine  (VISTARIL ) 50 MG capsule Take 1 capsule (50 mg total) by mouth every 6 (six) hours as needed for anxiety. 120 capsule 2   ibuprofen  (ADVIL ) 200 MG  tablet Take 200 mg by mouth every 6 (six) hours as needed for mild pain (pain score 1-3).     ketoconazole  (NIZORAL ) 2 % cream Apply 1 Application topically daily as needed (itching).     venlafaxine  XR (EFFEXOR -XR) 75 MG 24 hr capsule Take 1 capsule (75 mg total) by mouth daily with breakfast. 30 capsule 2   No current facility-administered medications for this visit.     Musculoskeletal: Strength & Muscle Tone: within normal limits Gait & Station: normal Patient leans: N/A  Psychiatric Specialty Exam: There were no vitals taken for this visit.There is no height or weight on file to calculate BMI. Review of Systems  General Appearance: Casual and Fairly Groomed  Eye Contact:  Good  Speech:  Clear and Coherent  Volume:  Normal  Mood:  Euthymic  Affect:  Appropriate  Thought Content: Logical   Suicidal Thoughts:  No  Homicidal Thoughts:  No  Thought Process:  Linear  Orientation:  Full (Time, Place, and Person)    Memory: Immediate;   Good Recent;   Good Remote;   Good  Judgment:  Fair  Insight:  Fair  Concentration:  Concentration: Good and Attention Span: Good  Recall:  not formally assessed   Fund of Knowledge: Good  Language: Fair  Psychomotor Activity:  Normal  Akathisia:  No  AIMS (if indicated): not done  Assets:  Communication Skills Desire for Improvement Financial  Resources/Insurance Housing Intimacy Leisure Time Vocational/Educational  ADL's:  Intact  Cognition: WNL  Sleep:  Good   Metabolic Disorder Labs: Lab Results  Component Value Date   HGBA1C 5.4 03/12/2024   MPG 108.28 03/12/2024   No results found for: PROLACTIN Lab Results  Component Value Date   CHOL 182 01/02/2023   TRIG 175.0 (H) 01/02/2023   HDL 59.20 01/02/2023   CHOLHDL 3 01/02/2023   VLDL 35.0 01/02/2023   LDLCALC 88 01/02/2023   Lab Results  Component Value Date   TSH 2.040 03/12/2024   TSH 1.62 01/02/2023    Therapeutic Level Labs: No results found for: LITHIUM No results found for: VALPROATE No results found for: CBMZ   Screenings: AUDIT    Flowsheet Row [facility]  Alcohol Use Disorder Identification Test Final Score (AUDIT) 0   GAD-7    Flowsheet Row Office Visit from 03/18/2024 in [facility] Office Visit from 12/20/2023 in [facility] [place] [facility] [employer] Visit from 01/23/2023 in [facility] [place] [facility] [employer] Visit from 01/02/2023 in [facility] [facility] [employer] Visit from 03/01/2022 in [facility] [employer]  Total GAD-7 Score 21 21 21 19 18    PHQ2-9    Flowsheet Row Office Visit from 03/18/2024 in [facility] Office Visit from 12/20/2023 in [facility] [place] [facility] [employer] Visit from 01/23/2023 in [facility] [place] [facility] [employer] Visit from 01/02/2023 in [facility] [place] [facility] [employer] Visit from 03/01/2022 in [facility] [place] [facility] [employer]  PHQ-2 Total Score 6 6 4 4 5   PHQ-9 Total Score 26 27 18 12 21    Flowsheet Row Office Visit from 03/18/2024 in [facility] Admission  (Discharged) [facility] from 03/10/2024 in [facility] Department at [facility]  C-SSRS RISK CATEGORY High Risk High  Risk High Risk    Collaboration of Care: Collaboration of Care: Other ***  Patient/Guardian was advised Release of Information must be obtained prior to any record release in order to collaborate their care with an outside provider. Patient/Guardian was advised if they have not already done so to contact the registration department to sign all necessary forms in order for us  to release information regarding their care.   Consent: Patient/Guardian gives verbal consent for treatment and assignment of benefits for services provided during this visit. Patient/Guardian expressed understanding and agreed to proceed.    Kaj Vasil, MD 04/14/2024, 2:22 PM

## 2024-04-17 ENCOUNTER — Other Ambulatory Visit: Payer: Self-pay

## 2024-04-17 ENCOUNTER — Encounter: Payer: Self-pay | Admitting: Psychiatry

## 2024-04-17 ENCOUNTER — Emergency Department
Admission: EM | Admit: 2024-04-17 | Discharge: 2024-04-17 | Disposition: A | Discharge status: Home / Self Care | Source: Home / Self Care | Attending: Emergency Medicine | Admitting: Emergency Medicine

## 2024-04-17 ENCOUNTER — Inpatient Hospital Stay
Admission: AD | Admit: 2024-04-17 | Discharge: 2024-04-25 | DRG: 885 | Disposition: A | Discharge status: Home / Self Care | Source: Intra-hospital | Attending: Psychiatry | Admitting: Psychiatry

## 2024-04-17 DIAGNOSIS — F32A Depression, unspecified: Secondary | ICD-10-CM | POA: Diagnosis not present

## 2024-04-17 DIAGNOSIS — F419 Anxiety disorder, unspecified: Secondary | ICD-10-CM | POA: Diagnosis present

## 2024-04-17 DIAGNOSIS — Z555 Less than a high school diploma: Secondary | ICD-10-CM | POA: Diagnosis not present

## 2024-04-17 DIAGNOSIS — Z811 Family history of alcohol abuse and dependence: Secondary | ICD-10-CM

## 2024-04-17 DIAGNOSIS — F339 Major depressive disorder, recurrent, unspecified: Secondary | ICD-10-CM | POA: Diagnosis present

## 2024-04-17 DIAGNOSIS — Z8249 Family history of ischemic heart disease and other diseases of the circulatory system: Secondary | ICD-10-CM | POA: Diagnosis not present

## 2024-04-17 DIAGNOSIS — R45851 Suicidal ideations: Secondary | ICD-10-CM | POA: Diagnosis present

## 2024-04-17 DIAGNOSIS — Z833 Family history of diabetes mellitus: Secondary | ICD-10-CM | POA: Diagnosis not present

## 2024-04-17 DIAGNOSIS — F4310 Post-traumatic stress disorder, unspecified: Secondary | ICD-10-CM | POA: Diagnosis present

## 2024-04-17 DIAGNOSIS — F17290 Nicotine dependence, other tobacco product, uncomplicated: Secondary | ICD-10-CM | POA: Diagnosis present

## 2024-04-17 DIAGNOSIS — Z635 Disruption of family by separation and divorce: Secondary | ICD-10-CM

## 2024-04-17 DIAGNOSIS — F332 Major depressive disorder, recurrent severe without psychotic features: Secondary | ICD-10-CM | POA: Insufficient documentation

## 2024-04-17 LAB — COMPREHENSIVE METABOLIC PANEL WITH GFR
ALT: 31 U/L (ref 0–44)
AST: 30 U/L (ref 15–41)
Albumin: 4.3 g/dL (ref 3.5–5.0)
Alkaline Phosphatase: 50 U/L (ref 38–126)
Anion gap: 10 (ref 5–15)
BUN: 17 mg/dL (ref 6–20)
CO2: 25 mmol/L (ref 22–32)
Calcium: 8.6 mg/dL — ABNORMAL LOW (ref 8.9–10.3)
Chloride: 103 mmol/L (ref 98–111)
Creatinine, Ser: 0.96 mg/dL (ref 0.61–1.24)
GFR, Estimated: >60 mL/min (ref >60)
Glucose, Bld: 89 mg/dL (ref 70–99)
Potassium: 3.8 mmol/L (ref 3.5–5.1)
Sodium: 138 mmol/L (ref 135–145)
Total Bilirubin: 0.6 mg/dL (ref 0.0–1.2)
Total Protein: 8.4 g/dL — ABNORMAL HIGH (ref 6.5–8.1)

## 2024-04-17 LAB — URINE DRUG SCREEN, QUALITATIVE (ARMC ONLY)
Amphetamines, Ur Screen: NOT DETECTED
Barbiturates, Ur Screen: NOT DETECTED
Benzodiazepine, Ur Scrn: NOT DETECTED
Cannabinoid 50 Ng, Ur ~~LOC~~: POSITIVE — AB
Cocaine Metabolite,Ur ~~LOC~~: NOT DETECTED
MDMA (Ecstasy)Ur Screen: NOT DETECTED
Methadone Scn, Ur: NOT DETECTED
Opiate, Ur Screen: NOT DETECTED
Phencyclidine (PCP) Ur S: NOT DETECTED
Tricyclic, Ur Screen: POSITIVE — AB

## 2024-04-17 LAB — CBC
HCT: 48.6 % (ref 39.0–52.0)
Hemoglobin: 16.5 g/dL (ref 13.0–17.0)
MCH: 30.1 pg (ref 26.0–34.0)
MCHC: 34 g/dL (ref 30.0–36.0)
MCV: 88.7 fL (ref 80.0–100.0)
Platelets: 391 K/uL (ref 150–400)
RBC: 5.48 MIL/uL (ref 4.22–5.81)
RDW: 13.1 % (ref 11.5–15.5)
WBC: 9.1 K/uL (ref 4.0–10.5)
nRBC: 0 % (ref 0.0–0.2)

## 2024-04-17 LAB — SALICYLATE LEVEL: Salicylate Lvl: <7 mg/dL — ABNORMAL LOW (ref 7.0–30.0)

## 2024-04-17 LAB — ACETAMINOPHEN LEVEL: Acetaminophen (Tylenol), Serum: <10 ug/mL — ABNORMAL LOW (ref 10–30)

## 2024-04-17 LAB — ETHANOL: Alcohol, Ethyl (B): 173 mg/dL — ABNORMAL HIGH (ref <15)

## 2024-04-17 MED ORDER — MAGNESIUM HYDROXIDE 400 MG/5ML PO SUSP: 30.0000 mL | Freq: Every day | ORAL | Status: DC | PRN

## 2024-04-17 MED ORDER — LORAZEPAM 2 MG/ML IJ SOLN: 2.0000 mg | Freq: Three times a day (TID) | INTRAMUSCULAR | Status: DC | PRN

## 2024-04-17 MED ORDER — HYDROXYZINE PAMOATE 50 MG PO CAPS
50.0000 mg | ORAL_CAPSULE | Freq: Four times a day (QID) | ORAL | Status: DC | PRN
  Filled 2024-04-17 (×3): qty 1

## 2024-04-17 MED ORDER — HALOPERIDOL LACTATE 5 MG/ML IJ SOLN: 5.0000 mg | Freq: Three times a day (TID) | INTRAMUSCULAR | Status: DC | PRN

## 2024-04-17 MED ORDER — ACETAMINOPHEN 325 MG PO TABS: 650.0000 mg | ORAL_TABLET | Freq: Four times a day (QID) | ORAL | Status: DC | PRN

## 2024-04-17 MED ORDER — VENLAFAXINE HCL ER 75 MG PO CP24
75.0000 mg | ORAL_CAPSULE | Freq: Every day | ORAL | Status: DC
  Administered 2024-04-18 – 2024-04-19 (×2): 75 mg via ORAL
  Filled 2024-04-17 (×2): qty 1

## 2024-04-17 MED ORDER — DIPHENHYDRAMINE HCL 25 MG PO CAPS: 50.0000 mg | ORAL_CAPSULE | Freq: Three times a day (TID) | ORAL | Status: DC | PRN

## 2024-04-17 MED ORDER — HYDROXYZINE HCL 25 MG PO TABS
50.0000 mg | ORAL_TABLET | Freq: Once | ORAL | Status: AC
  Administered 2024-04-17: 50 mg via ORAL
  Filled 2024-04-17: qty 2

## 2024-04-17 MED ORDER — DIPHENHYDRAMINE HCL 50 MG/ML IJ SOLN: 50.0000 mg | Freq: Three times a day (TID) | INTRAMUSCULAR | Status: DC | PRN

## 2024-04-17 MED ORDER — ALUM & MAG HYDROXIDE-SIMETH 200-200-20 MG/5ML PO SUSP: 30.0000 mL | ORAL | Status: DC | PRN

## 2024-04-17 MED ORDER — HALOPERIDOL LACTATE 5 MG/ML IJ SOLN: 10.0000 mg | Freq: Three times a day (TID) | INTRAMUSCULAR | Status: DC | PRN

## 2024-04-17 MED ORDER — HALOPERIDOL 5 MG PO TABS: 5.0000 mg | ORAL_TABLET | Freq: Three times a day (TID) | ORAL | Status: DC | PRN

## 2024-04-17 MED ORDER — HYDROXYZINE HCL 50 MG PO TABS
50.0000 mg | ORAL_TABLET | Freq: Four times a day (QID) | ORAL | Status: DC | PRN
  Administered 2024-04-17 – 2024-04-23 (×8): 50 mg via ORAL
  Filled 2024-04-17 (×7): qty 1
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
## 2024-04-17 NOTE — Group Note (Signed)
 Date:  04/17/2024 Time:  10:15 PM  Group Topic/Focus:  Coping With Mental Health Crisis:   The purpose of this group is to help patients identify strategies for coping with mental health crisis.  Group discusses possible causes of crisis and ways to manage them effectively.    Participation Level:  Active  Participation Quality:  Appropriate  Affect:  Appropriate  Cognitive:  Appropriate  Insight: Appropriate  Engagement in Group:  Engaged  Modes of Intervention:  Discussion  Additional Comments:    Luciano Emilio CROME 04/17/2024, 10:15 PM

## 2024-04-17 NOTE — Tx Team (Addendum)
 Initial Treatment Plan 04/17/2024 7:15 PM AXIEL FJELD FMW:[id number]    PATIENT STRESSORS: Marital or family conflict   Other: Change in family dynamics from conflict     PATIENT STRENGTHS: Ability for insight  Active sense of humor  Motivation for treatment/growth  Supportive family/friends    PATIENT IDENTIFIED PROBLEMS: Marriage conflict  Family conflict  Suicidal ideation and negative thoughts                 DISCHARGE CRITERIA:  Ability to meet basic life and health needs Adequate post-discharge living arrangements Improved stabilization in mood, thinking, and/or behavior  PRELIMINARY DISCHARGE PLAN: Attend aftercare/continuing care group Outpatient therapy  PATIENT/FAMILY INVOLVEMENT: This treatment plan has been presented to and reviewed with the patient, Anthony Harris.  The patient and family have been given the opportunity to ask questions and make suggestions.  Khalifa Knecht, RN 04/17/2024, 7:15 PM

## 2024-04-17 NOTE — Progress Notes (Signed)
   04/17/24 1900  Psych Admission Type (Psych Patients Only)  Admission Status Voluntary  Psychosocial Assessment  Patient Complaints Sadness  Eye Contact Fair  Facial Expression Other (Comment) (WDL)  Affect Sad  Speech Slow  Interaction Assertive  Motor Activity Other (Comment) (WDL)  Appearance/Hygiene Unremarkable  Behavior Characteristics Calm;Cooperative;Appropriate to situation  Mood Sad  Aggressive Behavior  Effect No apparent injury  Thought Process  Coherency WDL  Content WDL  Delusions None reported or observed  Perception WDL  Hallucination None reported or observed  Judgment Limited  Confusion None  Danger to Self  Current suicidal ideation? Denies  Self-Injurious Behavior No self-injurious ideation or behavior indicators observed or expressed   Agreement Not to Harm Self Yes  Description of Agreement Verbal  Danger to Others  Danger to Others None reported or observed

## 2024-04-17 NOTE — Plan of Care (Signed)
  Problem: Education: Goal: Knowledge of [facility] Education information/materials will improve Outcome: Not Applicable Goal: Emotional status will improve Outcome: Not Applicable Goal: Mental status will improve Outcome: Not Applicable Goal: Verbalization of understanding the information provided will improve Outcome: Not Applicable   Problem: Activity: Goal: Interest or engagement in activities will improve Outcome: Not Applicable Goal: Sleeping patterns will improve Outcome: Not Applicable   Problem: Coping: Goal: Ability to verbalize frustrations and anger appropriately will improve Outcome: Not Applicable Goal: Ability to demonstrate self-control will improve Outcome: Not Applicable   Problem: Health Behavior/Discharge Planning: Goal: Identification of resources available to assist in meeting health care needs will improve Outcome: Not Applicable Goal: Compliance with treatment plan for underlying cause of condition will improve Outcome: Not Applicable   Problem: Physical Regulation: Goal: Ability to maintain clinical measurements within normal limits will improve Outcome: Not Applicable   Problem: Safety: Goal: Periods of time without injury will increase Outcome: Not Applicable   As Anthony Harris has just been admitted, progress cannot yet be measured.

## 2024-04-17 NOTE — Progress Notes (Signed)
 Anthony Harris, who goes by Anthony Harris, arrives reporting increasing SI and negative thoughts. Reports a large amount of sadness and guilt r/t family conflict with wife and is facing a divorce after 31 years of marriage. Reports disturbed sleep and nightmares of his family leaving him. Reports loss of house and job as a result of negative thoughts from family conflict. UDS positive only for THC. Denies SI at this time, and states he looks forward to being in a safe place where he can get help.

## 2024-04-17 NOTE — ED Notes (Signed)
 Pt notified this RN that he was beginning to feel anxious and requested his home anxiety medication. EDP made aware. Medication ordered and administered per MAR.

## 2024-04-17 NOTE — ED Notes (Signed)
 Pt given phone to call his family per their request

## 2024-04-17 NOTE — ED Notes (Addendum)
 Pt belongings:  Blue tshirt Grey pants [employer] hat Phone Black necklace Watch Black belt [employer]

## 2024-04-17 NOTE — ED Notes (Signed)
 RN introduced self to pt. Pt calm and tearful. States that he is extremely depressed but is trying to do all the right things by going through inpatient treatment about a month ago. Taking his prescribed meds for depression and anxiety even though he hates taking medications. Pt said last night was very hard and he knew he needed to get more help than he was getting. He thinks maybe his medications need adjusting. That they aren't working enough.

## 2024-04-17 NOTE — BH Assessment (Signed)
 This Clinical research associate contacted [facility] via phone to request an assessment, request has been made, assessment is currently pending

## 2024-04-17 NOTE — Group Note (Signed)
 Recreation Therapy Group Note   Group Topic:Animal Assisted Therapy   Group Date: 04/17/2024 Start Time: 1400 End Time: 1430 Facilitators: Celestia Jeoffrey BRAVO, LRT, CTRS Location: Craft Room  Group Description: AAA. Animal-Assisted Activity provides opportunities for motivational, educational, therapeutic and/or recreational benefits to enhance quality of life. Selinda and Rollo visited the unit to interact with patients.    Goal Areas Addressed:  Reduced anxiety and stress Improved mood Increased social interaction Enhanced communication skills Reduced loneliness and isolation Improved emotional regulation   Affect/Mood: N/A   Participation Level: Did not attend    Clinical Observations/Individualized Feedback: Patient was not on the unit at the time of group.   Plan: Continue to engage patient in RT group sessions 2-3x/week.   Jeoffrey BRAVO Celestia, LRT, CTRS 04/17/2024 5:03 PM

## 2024-04-17 NOTE — ED Notes (Signed)
 Meal provided

## 2024-04-17 NOTE — ED Triage Notes (Addendum)
 Pt reports he has been depressed for awhile now, pt was hospitalized recently for thoughts of SI. Pt currently endorses thoughts of SI. Pt states he's been going through a divorce after 41 years. Pt reports having 4 beers PTA

## 2024-04-17 NOTE — ED Notes (Signed)
 VOL  PENDING  PLACEMENT

## 2024-04-17 NOTE — ED Notes (Signed)
Patient on the phone at this time 

## 2024-04-17 NOTE — ED Notes (Signed)
 TTS at beside to do assessment.

## 2024-04-17 NOTE — ED Notes (Signed)
 Attempted to call report; unit will return call

## 2024-04-17 NOTE — ED Notes (Signed)
Pt given phone to call his wife.

## 2024-04-17 NOTE — ED Notes (Addendum)
 Pt ambulatory to interview room with psych team for pt evaluation

## 2024-04-17 NOTE — ED Notes (Signed)
 Consent for admission signed by pt.

## 2024-04-17 NOTE — ED Provider Notes (Signed)
 [facility] Provider Note    Event Date/Time   First MD Initiated Contact with Patient 04/17/24 [phone]     (approximate)   History   Psychiatric Evaluation   HPI  Anthony Harris is a 48 y.o. male  who presents to the emergency department today because of concern for depression and suicidal thoughts. Patient has history of depression and SI, with hospitalization a little over a month ago. Since discharge he feels like he has been declining. Last night was particularly rough. The patient has been going to his therapist twice a week during this time.     Physical Exam   Triage Vital Signs: ED Triage Vitals  Encounter Vitals Group     BP 04/17/24 0020 (!) 140/111     Girls Systolic BP Percentile --      Girls Diastolic BP Percentile --      Boys Systolic BP Percentile --      Boys Diastolic BP Percentile --      Pulse Rate 04/17/24 0020 77     Resp 04/17/24 0020 20     Temp 04/17/24 0020 98.5 F (36.9 C)     Temp Source 04/17/24 0020 Oral     SpO2 04/17/24 0020 95 %     Weight 04/17/24 0022 195 lb (88.5 kg)     Height 04/17/24 0022 5' 10 (1.778 m)     Head Circumference --      Peak Flow --      Pain Score 04/17/24 0019 0     Pain Loc --      Pain Education --      Exclude from Growth Chart --     Most recent vital signs: Vitals:   04/17/24 0020  BP: (!) 140/111  Pulse: 77  Resp: 20  Temp: 98.5 F (36.9 C)  SpO2: 95%   General: Awake, alert, oriented. CV:  Good peripheral perfusion.  Resp:  Normal effort.  Abd:  No distention.  Other:  Depressed.    ED Results / Procedures / Treatments   Labs (all labs ordered are listed, but only abnormal results are displayed) Labs Reviewed  COMPREHENSIVE METABOLIC PANEL WITH GFR - Abnormal; Notable for the following components:      Result Value   Calcium 8.6 (*)    Total Protein 8.4 (*)    All other components within normal limits  ETHANOL - Abnormal; Notable for the following  components:   Alcohol, Ethyl (B) 173 (*)    All other components within normal limits  SALICYLATE LEVEL - Abnormal; Notable for the following components:   Salicylate Lvl <7.0 (*)    All other components within normal limits  ACETAMINOPHEN  LEVEL - Abnormal; Notable for the following components:   Acetaminophen  (Tylenol ), Serum <10 (*)    All other components within normal limits  CBC  URINE DRUG SCREEN, QUALITATIVE ([facility] ONLY)     EKG  None   RADIOLOGY None  PROCEDURES:  Critical Care performed: No   MEDICATIONS ORDERED IN ED: Medications  hydrOXYzine  (ATARAX ) tablet 50 mg (50 mg Oral Given 04/17/24 0337)     IMPRESSION / MDM / ASSESSMENT AND PLAN / ED COURSE  I reviewed the triage vital signs and the nursing notes.                              Differential diagnosis includes, but is not limited to, depression, SI,  drug induced mood disorder  Patient's presentation is most consistent with acute presentation with potential threat to life or bodily function.  Patient presented to the emergency department today with concern for depression and suicidal thoughts. On exam patient appears depressed. Will have psychiatry evaluate.  The patient has been placed in psychiatric observation due to the need to provide a safe environment for the patient while obtaining psychiatric consultation and evaluation, as well as ongoing medical and medication management to treat the patient's condition.  The patient has not been placed under full IVC at this time.       FINAL CLINICAL IMPRESSION(S) / ED DIAGNOSES   Final diagnoses:  Depression, unspecified depression type       Note:  This document was prepared using Dragon voice recognition software and may include unintentional dictation errors.    Floy Roberts, MD 04/17/24 [phone]

## 2024-04-17 NOTE — ED Notes (Signed)
 Report called to [facility] unit RN

## 2024-04-17 NOTE — ED Notes (Signed)
 Ambulatory to restroom with steady gait. No distress noted.

## 2024-04-18 DIAGNOSIS — F32A Depression, unspecified: Secondary | ICD-10-CM | POA: Diagnosis not present

## 2024-04-18 NOTE — Group Note (Signed)
 Date:  04/18/2024 Time:  2:28 PM  Group Topic/Focus:  Conflict Resolution:   The focus of this group is to discuss the conflict resolution process and how it may be used upon discharge.    Participation Level:  Active  Participation Quality:  Appropriate  Affect:  Appropriate  Cognitive:  Appropriate  Insight: Appropriate  Engagement in Group:  Engaged  Modes of Intervention:  Activity  Additional Comments:    Anthony Harris Anthony Harris 04/18/2024, 2:28 PM
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
## 2024-04-18 NOTE — Progress Notes (Signed)
   04/18/24 2100  Psych Admission Type (Psych Patients Only)  Admission Status Voluntary  Psychosocial Assessment  Patient Complaints None  Eye Contact Fair  Facial Expression Other (Comment) (WNL)  Affect Appropriate to circumstance  Speech Slow  Interaction Assertive  Motor Activity Slow  Appearance/Hygiene Unremarkable  Behavior Characteristics Cooperative;Appropriate to situation  Mood Pleasant  Aggressive Behavior  Effect No apparent injury  Thought Process  Coherency WDL  Content WDL  Delusions None reported or observed  Perception WDL  Hallucination None reported or observed  Judgment Impaired  Confusion None  Danger to Self  Current suicidal ideation? Denies  Agreement Not to Harm Self Yes  Description of Agreement verbal  Danger to Others  Danger to Others None reported or observed

## 2024-04-18 NOTE — Progress Notes (Signed)
   04/18/24 0834  Psych Admission Type (Psych Patients Only)  Admission Status Voluntary  Psychosocial Assessment  Patient Complaints None  Eye Contact Fair  Facial Expression Flat  Affect Appropriate to circumstance  Speech Slow  Interaction Assertive  Motor Activity Slow  Appearance/Hygiene Unremarkable  Behavior Characteristics Cooperative  Mood Pleasant  Aggressive Behavior  Effect No apparent injury  Thought Process  Coherency WDL  Content WDL  Delusions None reported or observed  Perception WDL  Hallucination None reported or observed  Judgment Limited  Confusion None  Danger to Self  Current suicidal ideation? Denies  Self-Injurious Behavior No self-injurious ideation or behavior indicators observed or expressed   Agreement Not to Harm Self Yes  Description of Agreement verbal  Danger to Others  Danger to Others None reported or observed

## 2024-04-18 NOTE — Plan of Care (Signed)
  Problem: Education: Goal: Ability to state activities that reduce stress will improve 04/18/2024 1449 by Shirley Jon FALCON, RN Outcome: Progressing 04/18/2024 1449 by Shirley Jon FALCON, RN Outcome: Progressing

## 2024-04-18 NOTE — Plan of Care (Signed)
  Problem: Education: Goal: Ability to state activities that reduce stress will improve 04/18/2024 1732 by Shirley Jon FALCON, RN Outcome: Progressing 04/18/2024 1449 by Shirley Jon FALCON, RN Outcome: Progressing 04/18/2024 1449 by Shirley Jon FALCON, RN Outcome: Progressing

## 2024-04-18 NOTE — [facility] Suicide Risk Assessment (Signed)
 [facility] Admission Suicide Risk Assessment   Nursing information obtained from:  Patient Demographic factors:  Male, Caucasian, Access to firearms Current Mental Status:  Belief that plan would result in death, Suicide plan Loss Factors:  Loss of significant relationship, Decrease in vocational status Historical Factors:  Family history of mental illness or substance abuse Risk Reduction Factors:  Sense of responsibility to family, Positive social support, Positive coping skills or problem solving skills, Positive therapeutic relationship  Total Time spent with patient: 30 minutes Principal Problem: Depressive disorder Diagnosis:  Principal Problem:   Depressive disorder  Subjective Data: This is a 48 year old male with history of MDD, anxiety, PTSD who presented for suicidal ideation without intent or plan in the context of worsening depression.  Current stressors include recent divorce from wife of 31 years.  Current medications include Effexor  and hydroxyzine .  Family history is significant for bipolar disorder.  Patient is admitted to the adult inpatient unit with every 15-minute safety monitoring.  Multidisciplinary team approach is offered.  Medication management, group/milieu therapy is offered.  Continued Clinical Symptoms:  Alcohol Use Disorder Identification Test Final Score (AUDIT): 3 The Alcohol Use Disorders Identification Test, Guidelines for Use in Primary Care, Second Edition.  [employer] writer [facility]). Score between 0-7:  no or low risk or alcohol related problems. Score between 8-15:  moderate risk of alcohol related problems. Score between 16-19:  high risk of alcohol related problems. Score 20 or above:  warrants further diagnostic evaluation for alcohol dependence and treatment.   CLINICAL FACTORS:   Depression:   Hopelessness   Musculoskeletal: Strength & Muscle Tone: within normal limits Gait & Station: normal Patient leans: N/A  Psychiatric Specialty  Exam:  Presentation  General Appearance:  Appropriate for Environment  Eye Contact: Fair  Speech: Clear and Coherent  Speech Volume: Normal  Handedness: Right   Mood and Affect  Mood: Anxious; Depressed  Affect: Congruent   Thought Process  Thought Processes: Coherent  Descriptions of Associations:Intact  Orientation:Full (Time, Place and Person)  Thought Content:WDL  History of Schizophrenia/Schizoaffective disorder:No  Duration of Psychotic Symptoms:No data recorded Hallucinations:Hallucinations: None  Ideas of Reference:None  Suicidal Thoughts:Suicidal Thoughts: Yes, Active SI Active Intent and/or Plan: Without Intent; Without Plan  Homicidal Thoughts:Homicidal Thoughts: No   Sensorium  Memory: Immediate Fair; Recent Fair; Remote Fair  Judgment: Fair  Insight: Fair   Art therapist  Concentration: Poor  Attention Span: Fair  Recall: [employer] of Knowledge: Fair  Language: Fair   Psychomotor Activity  Psychomotor Activity: Psychomotor Activity: Normal   Assets  Assets: Manufacturing systems engineer; Desire for Improvement; Housing; Social Support   Sleep  Sleep: Sleep: Fair    Physical Exam: Physical Exam ROS Blood pressure 118/82, pulse 83, temperature (!) 97.3 F (36.3 C), resp. rate 18, SpO2 100%. There is no height or weight on file to calculate BMI.   COGNITIVE FEATURES THAT CONTRIBUTE TO RISK:  None    SUICIDE RISK:   Severe:  Frequent, intense, and enduring suicidal ideation, specific plan, no subjective intent, but some objective markers of intent (i.e., choice of lethal method), the method is accessible, some limited preparatory behavior, evidence of impaired self-control, severe dysphoria/symptomatology, multiple risk factors present, and few if any protective factors, particularly a lack of social support.  PLAN OF CARE: Patient is admitted to the adult inpatient unit with every 15-minute safety  monitoring.  Multidisciplinary team approach is offered.  Medication management, group/milieu therapy is offered.  I certify that inpatient services furnished  can reasonably be expected to improve the patient's condition.   Davie Claud LITTIE Lukes, PA-C 04/18/2024, 9:02 PM

## 2024-04-18 NOTE — BH IP Treatment Plan (Signed)
 Interdisciplinary Treatment and Diagnostic Plan Update  04/18/2024 Time of Session: 2:45 PM Anthony Harris MRN: [id number]  Principal Diagnosis: Depressive disorder  Secondary Diagnoses: Principal Problem:   Depressive disorder   Current Medications:  Current Facility-Administered Medications  Medication Dose Route Frequency Provider Last Rate Last Admin   acetaminophen  (TYLENOL ) tablet 650 mg  650 mg Oral Q6H PRN Smith, Annie B, NP       alum & mag hydroxide-simeth (MAALOX/MYLANTA) 200-200-20 MG/5ML suspension 30 mL  30 mL Oral Q4H PRN Smith, Annie B, NP       haloperidol (HALDOL) tablet 5 mg  5 mg Oral TID PRN Smith, Annie B, NP       And   diphenhydrAMINE  (BENADRYL ) capsule 50 mg  50 mg Oral TID PRN Smith, Annie B, NP       haloperidol lactate (HALDOL) injection 5 mg  5 mg Intramuscular TID PRN Smith, Annie B, NP       And   diphenhydrAMINE  (BENADRYL ) injection 50 mg  50 mg Intramuscular TID PRN Smith, Annie B, NP       And   LORazepam (ATIVAN) injection 2 mg  2 mg Intramuscular TID PRN Smith, Annie B, NP       haloperidol lactate (HALDOL) injection 10 mg  10 mg Intramuscular TID PRN Smith, Annie B, NP       And   diphenhydrAMINE  (BENADRYL ) injection 50 mg  50 mg Intramuscular TID PRN Smith, Annie B, NP       And   LORazepam (ATIVAN) injection 2 mg  2 mg Intramuscular TID PRN Smith, Annie B, NP       hydrOXYzine  (ATARAX ) tablet 50 mg  50 mg Oral Q6H PRN Jadapalle, Sree, MD   50 mg at 04/18/24 1105   magnesium  hydroxide (MILK OF MAGNESIA) suspension 30 mL  30 mL Oral Daily PRN Smith, Annie B, NP       venlafaxine  XR (EFFEXOR -XR) 24 hr capsule 75 mg  75 mg Oral Q breakfast Smith, Annie B, NP   75 mg at 04/18/24 9165   PTA Medications: Medications Prior to Admission  Medication Sig Dispense Refill Last Dose/Taking   hydrOXYzine  (VISTARIL ) 50 MG capsule Take 1 capsule (50 mg total) by mouth every 6 (six) hours as needed for anxiety. 120 capsule 2 Past Week   venlafaxine  XR  (EFFEXOR -XR) 75 MG 24 hr capsule Take 1 capsule (75 mg total) by mouth daily with breakfast. 30 capsule 2 Past Week   hydrOXYzine  (ATARAX ) 25 MG tablet Take 25-50 mg by mouth every 6 (six) hours as needed. (Patient not taking: Reported on 04/18/2024)   Not Taking   ibuprofen  (ADVIL ) 200 MG tablet Take 200 mg by mouth every 6 (six) hours as needed for mild pain (pain score 1-3). (Patient not taking: Reported on 04/17/2024)   Not Taking   ketoconazole  (NIZORAL ) 2 % cream Apply 1 Application topically daily as needed (itching). (Patient not taking: Reported on 04/17/2024)   Not Taking    Patient Stressors: Marital or family conflict   Other: Change in family dynamics from conflict    Patient Strengths: Ability for insight  Active sense of humor  Motivation for treatment/growth  Supportive family/friends   Treatment Modalities: Medication Management, Group therapy, Case management,  1 to 1 session with clinician, Psychoeducation, Recreational therapy.   Physician Treatment Plan for Primary Diagnosis: Depressive disorder Long Term Goal(s):     Short Term Goals:    Medication Management: Evaluate patient's response, side  effects, and tolerance of medication regimen.  Therapeutic Interventions: 1 to 1 sessions, Unit Group sessions and Medication administration.  Evaluation of Outcomes: Not Met  Physician Treatment Plan for Secondary Diagnosis: Principal Problem:   Depressive disorder  Long Term Goal(s):     Short Term Goals:       Medication Management: Evaluate patient's response, side effects, and tolerance of medication regimen.  Therapeutic Interventions: 1 to 1 sessions, Unit Group sessions and Medication administration.  Evaluation of Outcomes: Not Met   RN Treatment Plan for Primary Diagnosis: Depressive disorder Long Term Goal(s): Knowledge of disease and therapeutic regimen to maintain health will improve  Short Term Goals: Ability to verbalize frustration and anger  appropriately will improve, Ability to demonstrate self-control, Ability to participate in decision making will improve, Ability to verbalize feelings will improve, Ability to disclose and discuss suicidal ideas, and Ability to identify and develop effective coping behaviors will improve  Medication Management: RN will administer medications as ordered by provider, will assess and evaluate patient's response and provide education to patient for prescribed medication. RN will report any adverse and/or side effects to prescribing provider.  Therapeutic Interventions: 1 on 1 counseling sessions, Psychoeducation, Medication administration, Evaluate responses to treatment, Monitor vital signs and CBGs as ordered, Perform/monitor CIWA, COWS, AIMS and Fall Risk screenings as ordered, Perform wound care treatments as ordered.  Evaluation of Outcomes: Not Met   LCSW Treatment Plan for Primary Diagnosis: Depressive disorder Long Term Goal(s): Safe transition to appropriate next level of care at discharge, Engage patient in therapeutic group addressing interpersonal concerns.  Short Term Goals: Engage patient in aftercare planning with referrals and resources, Increase social support, Increase ability to appropriately verbalize feelings, Increase emotional regulation, Facilitate acceptance of mental health diagnosis and concerns, Facilitate patient progression through stages of change regarding substance use diagnoses and concerns, Identify triggers associated with mental health/substance abuse issues, and Increase skills for wellness and recovery  Therapeutic Interventions: Assess for all discharge needs, 1 to 1 time with Social worker, Explore available resources and support systems, Assess for adequacy in community support network, Educate family and significant other(s) on suicide prevention, Complete Psychosocial Assessment, Interpersonal group therapy.  Evaluation of Outcomes: Not Met   Progress in  Treatment: Attending groups: No. Participating in groups: No. Taking medication as prescribed: Yes. Toleration medication: Yes. Family/Significant other contact made: No, will contact:  CSW to contact once permission is granted.  Patient understands diagnosis: Yes. Discussing patient identified problems/goals with staff: Yes. Medical problems stabilized or resolved: Yes. Denies suicidal/homicidal ideation: Yes. Issues/concerns per patient self-inventory: No. Other: None  New problem(s) identified: No, Describe:  None  New Short Term/Long Term Goal(s):detox, elimination of symptoms of psychosis, medication management for mood stabilization; elimination of SI thoughts; development of comprehensive mental wellness/sobriety plan.    Patient Goals:  Get back on track from the last time I was at [employer].  Discharge Plan or Barriers: CSW to assist with the development of appropriate discharge plan.    Reason for Continuation of Hospitalization: Anxiety Delusions  Depression Hallucinations Suicidal ideation  Estimated Length of Stay: 1-7 days.   Last 3 [place] Suicide Severity Risk Score: Flowsheet Row Admission (Current) from 04/17/2024 in [facility]PATIENT BE[facility]INE Most recent reading at 04/17/2024  5:00 PM ED from 04/17/2024 in [facility] Emergency Department at [facility] Most recent reading at 04/17/2024 12:21 AM Office Visit from 03/18/2024 in [facility] Most recent reading at 03/18/2024  8:50 AM  C-SSRS RISK CATEGORY High Risk High Risk High Risk    Last PHQ 2/9 Scores:    03/18/2024    8:50 AM 12/20/2023    2:10 PM 01/23/2023    3:46 PM  Depression screen PHQ 2/9  Decreased Interest 3 3 2   Down, Depressed, Hopeless 3 3 2   PHQ - 2 Score 6 6 4   Altered sleeping 3 3 3   Tired, decreased energy 3 3 2   Change in appetite 3 3 2   Feeling bad or failure about yourself  3 3 2   Trouble concentrating 3 3 2   Moving slowly or  fidgety/restless 3 3 3   Suicidal thoughts 2 3 0  PHQ-9 Score 26 27 18   Difficult doing work/chores Extremely dIfficult  Somewhat difficult    Scribe for Treatment Team: Alveta CHRISTELLA Kerns, LCSW 04/18/2024 3:01 PM

## 2024-04-18 NOTE — Group Note (Signed)
 Recreation Therapy Group Note   Group Topic:General Recreation  Group Date: 04/18/2024 Start Time: 1100 End Time: 1150 Facilitators: Celestia Jeoffrey BRAVO, LRT, CTRS Location: Courtyard  Group Description: [employer]. LRT and patients played games of basketball, drew with chalk, and played corn hole while outside in the courtyard while getting fresh air and sunlight. Music was being played in the background. LRT and peers conversed about different games they have played before, what they do in their free time and anything else that is on their minds. LRT encouraged pts to drink water after being outside, sweating and getting their heart rate up.  Goal Area(s) Addressed: Patient will build on frustration tolerance skills. Patients will partake in a competitive play game with peers. Patients will gain knowledge of new leisure interest/hobby.    Affect/Mood: Appropriate   Participation Level: Active   Participation Quality: Independent   Behavior: Appropriate   Speech/Thought Process: Coherent   Insight: Good   Judgement: Good   Modes of Intervention: Socialization   Patient Response to Interventions:  Attentive and Receptive   Education Outcome:  Acknowledges education   Clinical Observations/Individualized Feedback: [place] was active in their participation of session activities and group discussion. Pt interacted well with LRT and peers duration of session.    Plan: Continue to engage patient in RT group sessions 2-3x/week.   Jeoffrey BRAVO Celestia, LRT, CTRS 04/18/2024 11:57 AM

## 2024-04-18 NOTE — Group Note (Signed)
 Recreation Therapy Group Note   Group Topic:Leisure Education  Group Date: 04/18/2024 Start Time: 1530 End Time: 1630 Facilitators: Celestia Jeoffrey BRAVO, LRT, CTRS Location: Craft Room  Group Description: Leisure. Patients were given the option to choose from journaling, coloring, drawing, making origami, playing with playdoh, listening to music or singing karaoke. LRT and pts discussed the meaning of leisure, the importance of participating in leisure during their free time/when they're outside of the hospital, as well as how our leisure interests can also serve as coping skills.   Goal Area(s) Addressed:  Patient will identify a current leisure interest.  Patient will learn the definition of "leisure". Patient will practice making a positive decision. Patient will have the opportunity to try a new leisure activity. Patient will communicate with peers and LRT.    Affect/Mood: Appropriate   Participation Level: Active and Engaged   Participation Quality: Independent   Behavior: Appropriate, Calm, and Cooperative   Speech/Thought Process: Coherent   Insight: Good   Judgement: Good   Modes of Intervention: Education, Exploration, and Music   Patient Response to Interventions:  Attentive, Engaged, Interested , and Receptive   Education Outcome:  Acknowledges education   Clinical Observations/Individualized Feedback: [place] was active in their participation of session activities and group discussion. Pt identified run and hit my punching bag as things he does in his free time.    Plan: Continue to engage patient in RT group sessions 2-3x/week.   Jeoffrey BRAVO Celestia, LRT, CTRS 04/18/2024 5:03 PM

## 2024-04-18 NOTE — Plan of Care (Signed)
   Problem: Education: Goal: Ability to state activities that reduce stress will improve Outcome: Progressing

## 2024-04-18 NOTE — H&P (Signed)
 Psychiatric Admission Assessment Adult  Patient Identification: Anthony Harris MRN:  [id number] Date of Evaluation:  04/18/2024 Chief Complaint:  Depressive disorder [F32.A]   History of Present Illness:  [place] 48 y/o M with PMH of MDD, GAD, PTSD, ADD, ADHD presented voluntarily to the emergency department for evaluation of worsening depression, anxiety, and suicidal thoughts due to ongoing divorce.  He was hospitalized for psychiatric reasons (SI, MDD,GAD) on 03/11/24  following significant distress related to his divorce from his wife of 31 years. After discharge, he was started on Effexor  and hydroxyzine  and he reports initially doing well on medications while staying with his aunt but made what he describes as a "bad decision" to leave her home and isolate himself, since then, he reports worsening symptoms.  The patient endorses persistent depressive symptoms including low mood, low motivation, feelings of worthlessness and hopelessness, guilt, and anhedonia. He reports nightmares of his family running away from him, flashbacks, and hypervigilance  He describes racing thoughts and feeling emotionally unstable.  He endorses suicidal ideation but denies current intent or plan. He reports an episode where he held a loaded gun to his head with the safety off, but reports his wife and children entered the room and talked to him before he followed through with his intent to commit suicide.  This event was also brought on by family and marital stressors.  The patient expresses a desire to get help and denies homicidal ideation.  He denies hallucinations.  He does not display any objective psychotic features and is not observed responding to internal stimuli.  He reports social drinking (4-5 beers weekly, last drink Thursday) and vaping THC. He also acknowledges intermittent drug use "to numb the pain."  but denies any recent substance use.  He currently lives with his aunt and has good family  support. He has been unemployed since April. He denies access to firearms, as his wife has locked them away. He has no current legal issues. He sees a therapist and psychiatrist regularly.  During the interview, the patient appeared tearful, visibly distressed, and frequently spoke about losing his family as a major source of emotional pain. He denies any additional psychosocial stressors at this time.   Total Time spent with patient: 1 hour Sleep  Sleep:Sleep: Fair  Past Psychiatric History: MDD, PTSD, anxiety, ADHD Psychiatric History:  Information collected from the patient  Prev Dx/Sx: Depression, anxiety, suicidal ideation Current Psych Provider: Has not followed up yet Home Meds (current): Effexor , hydroxyzine  Previous Med Trials: None Therapy: Current  Prior Psych Hospitalization: Yes, September 2025 Prior Self Harm: Denies Prior Violence: Yes  Family Psych History: Bipolar disorder Family Hx suicide: Denies  Social History:  Educational Hx: Ninth grade Occupational Hx: Unemployed Armed forces operational officer Hx: Denies Living Situation: Lives with aunt currently Spiritual Hx: Yes Access to weapons/lethal means: Denies, patient reported ex-wife locked his gun in a safe during his previous psychiatric hospitalizatio  Substance History Alcohol: Occasional Type of alcohol beer Last Drink 1 day ago Number of drinks per day 4-5 beers weekly, not a daily drinker History of alcohol withdrawal seizures denies History of DT's denies Tobacco: Denies Illicit drugs: THC vape Prescription drug abuse: Denies Rehab hx: Denies Is the patient at risk to self? Yes.    Has the patient been a risk to self in the past 6 months? Yes.    Has the patient been a risk to self within the distant past? No.  Is the patient a risk to others? No.  Has  the patient been a risk to others in the past 6 months? No.  Has the patient been a risk to others within the distant past? No.   [place] Scale:  Flowsheet Row  Admission (Current) from 04/17/2024 in [facility] INPATIENT BEHAVIORAL MEDICINE Most recent reading at 04/17/2024  5:00 PM ED from 04/17/2024 in [facility] Emergency Department [facility] Most recent reading at 04/17/2024 12:21 AM Office Visit from 03/18/2024 in [facility] Most recent reading at 03/18/2024  8:50 AM  C-SSRS RISK CATEGORY High Risk High Risk High Risk     Past Medical History:  Past Medical History:  Diagnosis Date   Anxiety    Attention deficit disorder    childhood   GERD (gastroesophageal reflux disease)    Headache(784.0)    Lumbar disc herniation    Muscle spasm of back 06/21/2012   Right inguinal hernia 07/14/2013    Past Surgical History:  Procedure Laterality Date   HERNIA REPAIR     INGUINAL HERNIA REPAIR Right 07/16/2013   Procedure: HERNIA REPAIR INGUINAL ADULT;  Surgeon: Elon CHRISTELLA Pacini, MD;  Location: [facility] OR;  Service: General;  Laterality: Right;   INSERTION OF MESH Right 07/16/2013   Procedure: INSERTION OF MESH;  Surgeon: Elon CHRISTELLA Pacini, MD;  Location: [facility] OR;  Service: General;  Laterality: Right;   LUMBAR EPIDURAL INJECTION     Hx; of for pain   TONSILLECTOMY     Family History:  Family History  Problem Relation Age of Onset   Cancer Mother    Stroke Father    Hypercholesterolemia Father    Alcohol abuse Father    Diabetes Sister    Alcohol abuse Sister    Diabetes Brother    Hypertension Other    Cancer - Lung Other     Social History:  Social History   Substance and Sexual Activity  Alcohol Use Yes   Comment: Social drinking     Social History   Substance and Sexual Activity  Drug Use Not Currently   Comment: No active drug use. History of past drug use.      Allergies:  No Known Allergies Lab Results:  Results for orders placed or performed during the hospital encounter of 04/17/24 (from the past 48 hours)  Comprehensive metabolic panel     Status: Abnormal   Collection Time: 04/17/24 12:23 AM   Result Value Ref Range   Sodium 138 135 - 145 mmol/L   Potassium 3.8 3.5 - 5.1 mmol/L   Chloride 103 98 - 111 mmol/L   CO2 25 22 - 32 mmol/L   Glucose, Bld 89 70 - 99 mg/dL    Comment: Glucose reference range applies only to samples taken after fasting for at least 8 hours.   BUN 17 6 - 20 mg/dL   Creatinine, Ser 9.03 0.61 - 1.24 mg/dL   Calcium 8.6 (L) 8.9 - 10.3 mg/dL   Total Protein 8.4 (H) 6.5 - 8.1 g/dL   Albumin 4.3 3.5 - 5.0 g/dL   AST 30 15 - 41 U/L   ALT 31 0 - 44 U/L   Alkaline Phosphatase 50 38 - 126 U/L   Total Bilirubin 0.6 0.0 - 1.2 mg/dL   GFR, Estimated >39 >39 mL/min    Comment: (NOTE) Calculated using the CKD-EPI Creatinine Equation (2021)    Anion gap 10 5 - 15    Comment: Performed at [facility], [address] [place], [place] [address]  Ethanol  Status: Abnormal   Collection Time: 04/17/24 12:23 AM  Result Value Ref Range   Alcohol, Ethyl (B) 173 (H) <15 mg/dL    Comment: (NOTE) For medical purposes only. Performed at [facility], [address] [place], [place] [address]   cbc     Status: None   Collection Time: 04/17/24 12:23 AM  Result Value Ref Range   WBC 9.1 4.0 - 10.5 K/uL   RBC 5.48 4.22 - 5.81 MIL/uL   Hemoglobin 16.5 13.0 - 17.0 g/dL   HCT 51.3 60.9 - 47.9 %   MCV 88.7 80.0 - 100.0 fL   MCH 30.1 26.0 - 34.0 pg   MCHC 34.0 30.0 - 36.0 g/dL   RDW 86.8 88.4 - 84.4 %   Platelets 391 150 - 400 K/uL   nRBC 0.0 0.0 - 0.2 %    Comment: Performed at [facility], [address], [place], [place] [address]  Salicylate level     Status: Abnormal   Collection Time: 04/17/24 12:23 AM  Result Value Ref Range   Salicylate Lvl <7.0 (L) 7.0 - 30.0 mg/dL    Comment: Performed at [facility], [address] [place], [place] [address]  Acetaminophen  level     Status: Abnormal   Collection Time: 04/17/24 12:23 AM  Result Value Ref Range   Acetaminophen  (Tylenol ), Serum <10 (L) 10 - 30 ug/mL     Comment: (NOTE) Therapeutic concentrations vary significantly. A range of 10-30 ug/mL  may be an effective concentration for many patients. However, some  are best treated at concentrations outside of this range. Acetaminophen  concentrations >150 ug/mL at 4 hours after ingestion  and >50 ug/mL at 12 hours after ingestion are often associated with  toxic reactions.  Performed at [facility], [address] [place], [place] [address]   Urine Drug Screen, Qualitative     Status: Abnormal   Collection Time: 04/17/24 10:33 AM  Result Value Ref Range   Tricyclic, Ur Screen POSITIVE (A) NONE DETECTED   Amphetamines, Ur Screen NONE DETECTED NONE DETECTED   MDMA (Ecstasy)Ur Screen NONE DETECTED NONE DETECTED   Cocaine Metabolite,Ur [place] NONE DETECTED NONE DETECTED   Opiate, Ur Screen NONE DETECTED NONE DETECTED   Phencyclidine (PCP) Ur S NONE DETECTED NONE DETECTED   Cannabinoid 50 Ng, Ur [place] POSITIVE (A) NONE DETECTED   Barbiturates, Ur Screen NONE DETECTED NONE DETECTED   Benzodiazepine, Ur Scrn NONE DETECTED NONE DETECTED   Methadone Scn, Ur NONE DETECTED NONE DETECTED    Comment: (NOTE) Tricyclics + metabolites, urine    Cutoff 1000 ng/mL Amphetamines + metabolites, urine  Cutoff 1000 ng/mL MDMA (Ecstasy), urine              Cutoff 500 ng/mL Cocaine Metabolite, urine          Cutoff 300 ng/mL Opiate + metabolites, urine        Cutoff 300 ng/mL Phencyclidine (PCP), urine         Cutoff 25 ng/mL Cannabinoid, urine                 Cutoff 50 ng/mL Barbiturates + metabolites, urine  Cutoff 200 ng/mL Benzodiazepine, urine              Cutoff 200 ng/mL Methadone, urine                   Cutoff 300 ng/mL  The urine drug screen provides only a preliminary, unconfirmed analytical test result and  should not be used for non-medical purposes. Clinical consideration and professional judgment should be applied to any positive drug screen result due to possible interfering substances. A  more specific alternate chemical method must be used in order to obtain a confirmed analytical result. Gas chromatography / mass spectrometry (GC/MS) is the preferred confirm atory method. Performed at [facility], [address] [place], [place] [address]     Blood Alcohol level:  Lab Results  Component Value Date   ETH 173 (H) 04/17/2024   ETH <15 03/10/2024    Metabolic Disorder Labs:  Lab Results  Component Value Date   HGBA1C 5.4 03/12/2024   MPG 108.28 03/12/2024   No results found for: PROLACTIN Lab Results  Component Value Date   CHOL 182 01/02/2023   TRIG 175.0 (H) 01/02/2023   HDL 59.20 01/02/2023   CHOLHDL 3 01/02/2023   VLDL 35.0 01/02/2023   LDLCALC 88 01/02/2023    Current Medications: Current Facility-Administered Medications  Medication Dose Route Frequency Provider Last Rate Last Admin   acetaminophen  (TYLENOL ) tablet 650 mg  650 mg Oral Q6H PRN Smith, Annie B, NP       alum & mag hydroxide-simeth (MAALOX/MYLANTA) 200-200-20 MG/5ML suspension 30 mL  30 mL Oral Q4H PRN Smith, Annie B, NP       haloperidol (HALDOL) tablet 5 mg  5 mg Oral TID PRN Smith, Annie B, NP       And   diphenhydrAMINE  (BENADRYL ) capsule 50 mg  50 mg Oral TID PRN Smith, Annie B, NP       haloperidol lactate (HALDOL) injection 5 mg  5 mg Intramuscular TID PRN Smith, Annie B, NP       And   diphenhydrAMINE  (BENADRYL ) injection 50 mg  50 mg Intramuscular TID PRN Smith, Annie B, NP       And   LORazepam (ATIVAN) injection 2 mg  2 mg Intramuscular TID PRN Smith, Annie B, NP       haloperidol lactate (HALDOL) injection 10 mg  10 mg Intramuscular TID PRN Smith, Annie B, NP       And   diphenhydrAMINE  (BENADRYL ) injection 50 mg  50 mg Intramuscular TID PRN Smith, Annie B, NP       And   LORazepam (ATIVAN) injection 2 mg  2 mg Intramuscular TID PRN Smith, Annie B, NP       hydrOXYzine  (ATARAX ) tablet 50 mg  50 mg Oral Q6H PRN Jadapalle, Sree, MD   50 mg at 04/18/24 1105    magnesium  hydroxide (MILK OF MAGNESIA) suspension 30 mL  30 mL Oral Daily PRN Smith, Annie B, NP       venlafaxine  XR (EFFEXOR -XR) 24 hr capsule 75 mg  75 mg Oral Q breakfast Smith, Annie B, NP   75 mg at 04/18/24 9165   PTA Medications: Medications Prior to Admission  Medication Sig Dispense Refill Last Dose/Taking   hydrOXYzine  (VISTARIL ) 50 MG capsule Take 1 capsule (50 mg total) by mouth every 6 (six) hours as needed for anxiety. 120 capsule 2 Past Week   venlafaxine  XR (EFFEXOR -XR) 75 MG 24 hr capsule Take 1 capsule (75 mg total) by mouth daily with breakfast. 30 capsule 2 Past Week   hydrOXYzine  (ATARAX ) 25 MG tablet Take 25-50 mg by mouth every 6 (six) hours as needed. (Patient not taking: Reported on 04/18/2024)   Not Taking   ibuprofen  (ADVIL ) 200 MG tablet Take 200 mg by mouth every 6 (six) hours as needed  for mild pain (pain score 1-3). (Patient not taking: Reported on 04/17/2024)   Not Taking   ketoconazole  (NIZORAL ) 2 % cream Apply 1 Application topically daily as needed (itching). (Patient not taking: Reported on 04/17/2024)   Not Taking    Psychiatric Specialty Exam:  Presentation  General Appearance:  Appropriate for Environment  Eye Contact: Fair  Speech: Clear and Coherent  Speech Volume: Normal    Mood and Affect  Mood: Anxious; Depressed  Affect: Congruent   Thought Process  Thought Processes: Coherent  Descriptions of Associations:Intact  Orientation:Full (Time, Place and Person)  Thought Content:WDL  Hallucinations:Hallucinations: None  Ideas of Reference:None  Suicidal Thoughts:Suicidal Thoughts: Yes, Active SI Active Intent and/or Plan: Without Intent; Without Plan  Homicidal Thoughts:Homicidal Thoughts: No   Sensorium  Memory: Immediate Fair; Recent Fair; Remote Fair  Judgment: Fair  Insight: Fair   Art therapist  Concentration: Poor  Attention Span: Fair  Recall: [employer] of  Knowledge: Fair  Language: Fair   Psychomotor Activity  Psychomotor Activity:Psychomotor Activity: Normal   Assets  Assets: Manufacturing systems engineer; Desire for Improvement; Housing; Social Support    Musculoskeletal: Strength & Muscle Tone: within normal limits Gait & Station: normal  Physical Exam: Physical Exam Constitutional:      General: He is not in acute distress. HENT:     Head: Normocephalic.     Nose: Nose normal.     Mouth/Throat:     Mouth: Mucous membranes are moist.  Eyes:     Conjunctiva/sclera: Conjunctivae normal.  Pulmonary:     Effort: Pulmonary effort is normal.  Neurological:     Mental Status: He is alert and oriented to person, place, and time.  Psychiatric:        Attention and Perception: Attention normal.        Mood and Affect: Mood is anxious and depressed.        Speech: Speech is rapid and pressured.        Behavior: Behavior is not agitated, aggressive, withdrawn or hyperactive. Behavior is cooperative.        Thought Content: Thought content is not paranoid or delusional. Thought content includes suicidal ideation. Thought content does not include homicidal ideation. Thought content does not include homicidal or suicidal plan.        Cognition and Memory: Cognition normal.        Judgment: Judgment is impulsive.    Review of Systems  Psychiatric/Behavioral:  Positive for depression and suicidal ideas. Negative for hallucinations and substance abuse. The patient is nervous/anxious and has insomnia.   All other systems reviewed and are negative.  Blood pressure 118/82, pulse 83, temperature (!) 97.3 F (36.3 C), resp. rate 18, SpO2 100%. There is no height or weight on file to calculate BMI.  Principal Diagnosis: Depressive disorder Diagnosis:  Principal Problem:   Depressive disorder   Clinical Decision Making: Patient appropriate for stabilization via inpatient hospitalization due to suicidal ideation in the context of worsening  depression due to multiple significant stressors.  Treatment Plan Summary:  Safety and Monitoring:             -- Voluntary admission to inpatient psychiatric unit for safety, stabilization and treatment             -- Daily contact with patient to assess and evaluate symptoms and progress in treatment             -- Patient's case to be discussed in multi-disciplinary team meeting             --  Observation Level: q15 minute checks             -- Vital signs:  q12 hours             -- Precautions: suicide, elopement, and assault   2. Psychiatric Diagnoses and Treatment:              MDD, recurrent, severe             PTSD Continue Effexor  75 mg once daily Continue hydroxyzine  50 mg every 6 hours as needed for anxiety  -- The risks/benefits/side-effects/alternatives to this medication were discussed in detail with the patient and time was given for questions. The patient consents to medication trial.                -- Metabolic profile and EKG monitoring obtained while on an atypical antipsychotic (BMI: Lipid Panel: HbgA1c: QTc:)              -- Encouraged patient to participate in unit milieu and in scheduled group therapies                            3. Medical Issues Being Addressed:  No acute medical issues identified at this time   4. Discharge Planning:              -- Social work and case management to assist with discharge planning and identification of hospital follow-up needs prior to discharge             -- Estimated LOS: 5-7 days             -- Discharge Concerns: Need to establish a safety plan; Medication compliance and effectiveness             -- Discharge Goals: Return home with outpatient referrals follow ups  Physician Treatment Plan for Primary Diagnosis: Depressive disorder Long Term Goal(s): Improvement in symptoms so as ready for discharge  Short Term Goals: Ability to identify changes in lifestyle to reduce recurrence of condition will improve, Ability to  verbalize feelings will improve, Ability to demonstrate self-control will improve, and Ability to identify and develop effective coping behaviors will improve  Physician Treatment Plan for Secondary Diagnosis: Principal Problem:   Depressive disorder  Long Term Goal(s): Improvement in symptoms so as ready for discharge  Short Term Goals: Ability to identify changes in lifestyle to reduce recurrence of condition will improve, Ability to verbalize feelings will improve, Ability to demonstrate self-control will improve, and Ability to identify and develop effective coping behaviors will improve  I certify that inpatient services furnished can reasonably be expected to improve the patient's condition.    Camelia LITTIE Lukes, PA-C 10/10/20258:42 PM

## 2024-04-18 NOTE — Group Note (Signed)
 Date:  04/18/2024 Time:  12:59 PM  Group Topic/Focus:  Emotional Education:   The focus of this group is to discuss what feelings/emotions are, and how they are experienced.    Participation Level:  Did Not Attend   Camellia HERO Nimai Burbach 04/18/2024, 12:59 PM

## 2024-04-19 DIAGNOSIS — F32A Depression, unspecified: Secondary | ICD-10-CM | POA: Diagnosis not present

## 2024-04-19 MED ORDER — VENLAFAXINE HCL ER 75 MG PO CP24
150.0000 mg | ORAL_CAPSULE | Freq: Every day | ORAL | Status: DC
  Administered 2024-04-20 – 2024-04-25 (×6): 150 mg via ORAL
  Filled 2024-04-19 (×6): qty 2

## 2024-04-19 MED ORDER — INFLUENZA VIRUS VACC SPLIT PF (FLUZONE) 0.5 ML IM SUSY
0.5000 mL | PREFILLED_SYRINGE | INTRAMUSCULAR | Status: DC
  Filled 2024-04-19: qty 0.5

## 2024-04-19 NOTE — Group Note (Signed)
 Date:  04/19/2024 Time:  4:25 PM  Group Topic/Focus:  Healthy Communication:   The focus of this group is to discuss communication, barriers to communication, as well as healthy ways to communicate with others.    Participation Level:  Active  Participation Quality:  Appropriate  Affect:  Appropriate  Cognitive:  Appropriate  Insight: Appropriate  Engagement in Group:  Engaged  Modes of Intervention:  Activity  Additional Comments:    Camellia HERO Yanet Balliet 04/19/2024, 4:25 PM

## 2024-04-19 NOTE — Progress Notes (Signed)
   04/19/24 0833  Psych Admission Type (Psych Patients Only)  Admission Status Voluntary  Psychosocial Assessment  Patient Complaints Anxiety;Depression (rated 5 out of 10)  Eye Contact Fair  Facial Expression Other (Comment)  Affect Appropriate to circumstance  Speech Logical/coherent  Interaction Assertive  Motor Activity Slow  Appearance/Hygiene Unremarkable  Behavior Characteristics Cooperative  Mood Pleasant  Aggressive Behavior  Effect No apparent injury  Thought Process  Coherency WDL  Content WDL  Delusions None reported or observed  Perception WDL  Hallucination None reported or observed  Judgment Impaired  Confusion WDL  Danger to Self  Current suicidal ideation? Denies  Self-Injurious Behavior No self-injurious ideation or behavior indicators observed or expressed   Agreement Not to Harm Self Yes  Description of Agreement verbal  Danger to Others  Danger to Others None reported or observed

## 2024-04-19 NOTE — Progress Notes (Signed)
 [facility] MD Progress Note  04/19/2024 2:23 PM Anthony Harris  MRN:  [id number]   Subjective:  Chart reviewed, case discussed in multidisciplinary meeting, patient seen during rounds.   Patient seen today for follow-up they are alert and oriented.  They are pleasant and cooperative on exam.  They are linear logical and future oriented.  They are in anxiety is 4 out of 10.  They rated depression as 3 out of 10.  They deny SI, HI, and AVH.  They report they only missed 2 days of their Effexor  and he had been taking this current dose for about a month.  They are agreeable to dose increase to 150 milligrams reviewed potential adverse effects.  They are agreeable with the plan.  Encouraged patient to work on developing coping mechanisms.  Discussed the importance of aftercare.  Encouraged him to work on identifying their social support system.  They report stable sleep and appetite.  They voiced no concerns or complaints.   Sleep: Fair  Appetite:  Fair  Past Psychiatric History: see h&P Family History:  Family History  Problem Relation Age of Onset   Cancer Mother    Stroke Father    Hypercholesterolemia Father    Alcohol abuse Father    Diabetes Sister    Alcohol abuse Sister    Diabetes Brother    Hypertension Other    Cancer - Lung Other    Social History:  Social History   Substance and Sexual Activity  Alcohol Use Yes   Comment: Social drinking     Social History   Substance and Sexual Activity  Drug Use Not Currently   Comment: No active drug use. History of past drug use.    Social History   Socioeconomic History   Marital status: Legally Separated    Spouse name: Not on file   Number of children: 3   Years of education: Not on file   Highest education level: 9th grade  Occupational History   Occupation: Emergency planning/management officer  Tobacco Use   Smoking status: Former    Types: Cigarettes   Smokeless tobacco: Never   Tobacco comments:    at age 48  Vaping Use   Vaping  status: Every Day   Devices: THC  Substance and Sexual Activity   Alcohol use: Yes    Comment: Social drinking   Drug use: Not Currently    Comment: No active drug use. History of past drug use.   Sexual activity: Yes  Other Topics Concern   Not on file  Social History Narrative   Patient lives in [place] with his wife.   Had education up to 10th grade.   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   Has 3 kids. All healthy.         Social Drivers of Corporate investment banker Strain: Not on file  Food Insecurity: No Food Insecurity (04/17/2024)   Hunger Vital Sign    Worried About Running Out of Food in the Last Year: Never true    Ran Out of Food in the Last Year: Never true  Transportation Needs: No Transportation Needs (04/17/2024)   PRAPARE - Administrator, Civil Service (Medical): No    Lack of Transportation (Non-Medical): No  Physical Activity: Not on file  Stress: Not on file  Social Connections: Unknown (11/18/2021)   Received from [facility]   Social Network    Social Network: Not on file  Past Medical History:  Past Medical History:  Diagnosis Date   Anxiety    Attention deficit disorder    childhood   GERD (gastroesophageal reflux disease)    Headache(784.0)    Lumbar disc herniation    Muscle spasm of back 06/21/2012   Right inguinal hernia 07/14/2013    Past Surgical History:  Procedure Laterality Date   HERNIA REPAIR     INGUINAL HERNIA REPAIR Right 07/16/2013   Procedure: HERNIA REPAIR INGUINAL ADULT;  Surgeon: Elon CHRISTELLA Pacini, MD;  Location: [facility] OR;  Service: General;  Laterality: Right;   INSERTION OF MESH Right 07/16/2013   Procedure: INSERTION OF MESH;  Surgeon: Elon CHRISTELLA Pacini, MD;  Location: [facility] OR;  Service: General;  Laterality: Right;   LUMBAR EPIDURAL INJECTION     Hx; of for pain   TONSILLECTOMY      Current Medications: Current Facility-Administered Medications  Medication Dose Route Frequency  Provider Last Rate Last Admin   acetaminophen  (TYLENOL ) tablet 650 mg  650 mg Oral Q6H PRN Smith, Annie B, NP       alum & mag hydroxide-simeth (MAALOX/MYLANTA) 200-200-20 MG/5ML suspension 30 mL  30 mL Oral Q4H PRN Smith, Annie B, NP       haloperidol (HALDOL) tablet 5 mg  5 mg Oral TID PRN Smith, Annie B, NP       And   diphenhydrAMINE  (BENADRYL ) capsule 50 mg  50 mg Oral TID PRN Smith, Annie B, NP       haloperidol lactate (HALDOL) injection 5 mg  5 mg Intramuscular TID PRN Smith, Annie B, NP       And   diphenhydrAMINE  (BENADRYL ) injection 50 mg  50 mg Intramuscular TID PRN Smith, Annie B, NP       And   LORazepam (ATIVAN) injection 2 mg  2 mg Intramuscular TID PRN Smith, Annie B, NP       haloperidol lactate (HALDOL) injection 10 mg  10 mg Intramuscular TID PRN Smith, Annie B, NP       And   diphenhydrAMINE  (BENADRYL ) injection 50 mg  50 mg Intramuscular TID PRN Smith, Annie B, NP       And   LORazepam (ATIVAN) injection 2 mg  2 mg Intramuscular TID PRN Smith, Annie B, NP       hydrOXYzine  (ATARAX ) tablet 50 mg  50 mg Oral Q6H PRN Jadapalle, Sree, MD   50 mg at 04/19/24 0844   magnesium  hydroxide (MILK OF MAGNESIA) suspension 30 mL  30 mL Oral Daily PRN Smith, Annie B, NP       [START ON 04/20/2024] venlafaxine  XR (EFFEXOR -XR) 24 hr capsule 150 mg  150 mg Oral Q breakfast Fuller Makin E, PA-C        Lab Results: No results found for this or any previous visit (from the past 48 hours).  Blood Alcohol level:  Lab Results  Component Value Date   ETH 173 (H) 04/17/2024   ETH <15 03/10/2024    Metabolic Disorder Labs: Lab Results  Component Value Date   HGBA1C 5.4 03/12/2024   MPG 108.28 03/12/2024   No results found for: PROLACTIN Lab Results  Component Value Date   CHOL 182 01/02/2023   TRIG 175.0 (H) 01/02/2023   HDL 59.20 01/02/2023   CHOLHDL 3 01/02/2023   VLDL 35.0 01/02/2023   LDLCALC 88 01/02/2023    Physical Findings: AIMS:  , ,  ,  ,    CIWA:     COWS:  Psychiatric Specialty Exam:  Presentation  General Appearance:  Appropriate for Environment  Eye Contact: Fair  Speech: Clear and Coherent  Speech Volume: Normal    Mood and Affect  Mood: Anxious; Depressed  Affect: Congruent   Thought Process  Thought Processes: Coherent  Descriptions of Associations:Intact  Orientation:Full (Time, Place and Person)  Thought Content:WDL  Hallucinations:Hallucinations: None  Ideas of Reference:None  Suicidal Thoughts:Suicidal Thoughts: Yes, Active SI Active Intent and/or Plan: Without Intent; Without Plan  Homicidal Thoughts:Homicidal Thoughts: No   Sensorium  Memory: Immediate Fair; Recent Fair; Remote Fair  Judgment: Fair  Insight: Fair   Art therapist  Concentration: Poor  Attention Span: Fair  Recall: [employer] of Knowledge: Fair  Language: Fair   Psychomotor Activity  Psychomotor Activity: Psychomotor Activity: Normal  Musculoskeletal: Strength & Muscle Tone: within normal limits Gait & Station: normal Assets  Assets: Manufacturing systems engineer; Desire for Improvement; Housing; Social Support    Physical Exam: Physical Exam Vitals and nursing note reviewed.  HENT:     Head: Atraumatic.  Eyes:     Extraocular Movements: Extraocular movements intact.  Pulmonary:     Effort: Pulmonary effort is normal.  Neurological:     Mental Status: He is alert and oriented to person, place, and time.    Review of Systems  Psychiatric/Behavioral:  Positive for depression. Negative for hallucinations and suicidal ideas. The patient is nervous/anxious. The patient does not have insomnia.    Blood pressure 109/88, pulse 66, temperature (!) 97 F (36.1 C), resp. rate 18, SpO2 99%. There is no height or weight on file to calculate BMI.  Diagnosis: Principal Problem:   Depressive disorder   PLAN: Safety and Monitoring:  -- Voluntary admission to inpatient psychiatric unit  for safety, stabilization and treatment  -- Daily contact with patient to assess and evaluate symptoms and progress in treatment  -- Patient's case to be discussed in multi-disciplinary team meeting  -- Observation Level : q15 minute checks  -- Vital signs:  q12 hours  -- Precautions: suicide, elopement, and assault -- Encouraged patient to participate in unit milieu and in scheduled group therapies  2. Psychiatric Diagnoses and Treatment:       MDD, recurrent, severe             PTSD increase Effexor  to 150 mg once daily Continue hydroxyzine  50 mg every 6 hours as needed for anxiety   -- The risks/benefits/side-effects/alternatives to this medication were discussed in detail with the patient and time was given for questions. The patient consents to medication trial.                -- Metabolic profile and EKG monitoring obtained while on an atypical antipsychotic (BMI: Lipid Panel: HbgA1c: QTc:)              -- Encouraged patient to participate in unit milieu and in scheduled group therapies                            3. Medical Issues Being Addressed:  No acute medical issues identified at this time 4. Discharge Planning:   -- Social work and case management to assist with discharge planning and identification of hospital follow-up needs prior to discharge  -- Estimated LOS: 3-4 days  Donnice FORBES Right, PA-C 04/19/2024, 2:23 PM

## 2024-04-19 NOTE — Plan of Care (Signed)
  Problem: Education: Goal: Ability to state activities that reduce stress will improve Outcome: Progressing   Problem: Coping: Goal: Ability to identify and develop effective coping behavior will improve Outcome: Progressing   Problem: Self-Concept: Goal: Ability to identify factors that promote anxiety will improve Outcome: Progressing Goal: Level of anxiety will decrease Outcome: Progressing Note: With PRN medications Goal: Ability to modify response to factors that promote anxiety will improve Outcome: Progressing

## 2024-04-19 NOTE — [facility] Suicide Risk Assessment (Signed)
 [facility] INPATIENT:  Family/Significant Other Suicide Prevention Education  Suicide Prevention Education:  Education Completed; Jewel Venditto, wife, [phone],  has been identified by the patient as the family member/significant other with whom the patient will be residing, and identified as the person(s) who will aid the patient in the event of a mental health crisis (suicidal ideations/suicide attempt).  With written consent from the patient, the family member/significant other has been provided the following suicide prevention education, prior to the and/or following the discharge of the patient.  The suicide prevention education provided includes the following: Suicide risk factors Suicide prevention and interventions National Suicide Hotline telephone number [facility] assessment telephone number [facility] Emergency Assistance 911 [facility] and/or Residential Mobile Crisis Unit telephone number  Request made of family/significant other to: Remove weapons (e.g., guns, rifles, knives), all items previously/currently identified as safety concern.   Remove drugs/medications (over-the-counter, prescriptions, illicit drugs), all items previously/currently identified as a safety concern.  The family member/significant other verbalizes understanding of the suicide prevention education information provided.  The family member/significant other agrees to remove the items of safety concern listed above.  Anthony Harris 04/19/2024, 3:26 PM

## 2024-04-19 NOTE — Group Note (Signed)
 Date:  04/19/2024 Time:  8:49 PM  Group Topic/Focus:  Emotional Education:   The focus of this group is to discuss what feelings/emotions are, and how they are experienced. Making Healthy Choices:   The focus of this group is to help patients identify negative/unhealthy choices they were using prior to admission and identify positive/healthier coping strategies to replace them upon discharge. Self Care:   The focus of this group is to help patients understand the importance of self-care in order to improve or restore emotional, physical, spiritual, interpersonal, and financial health.    Participation Level:  Active  Participation Quality:  Appropriate and Attentive  Affect:  Appropriate  Cognitive:  Alert, Appropriate, and Oriented  Insight: Appropriate and Good  Engagement in Group:  Engaged  Modes of Intervention:  Discussion and Support  Additional Comments:  N/A  Butler LITTIE Gelineau 04/19/2024, 8:49 PM

## 2024-04-20 DIAGNOSIS — F32A Depression, unspecified: Secondary | ICD-10-CM | POA: Diagnosis not present

## 2024-04-20 MED ORDER — PHENYLEPHRINE-MINERAL OIL-PET 0.25-14-74.9 % RE OINT
1.0000 | TOPICAL_OINTMENT | Freq: Two times a day (BID) | RECTAL | Status: DC | PRN
  Administered 2024-04-24: 1 via RECTAL
  Filled 2024-04-20: qty 57

## 2024-04-20 MED ORDER — LIDOCAINE 5 % EX PTCH
1.0000 | MEDICATED_PATCH | CUTANEOUS | Status: DC
  Administered 2024-04-20 – 2024-04-25 (×6): 1 via TRANSDERMAL
  Filled 2024-04-20 (×6): qty 1

## 2024-04-20 NOTE — Progress Notes (Signed)
   04/20/24 1944  Psych Admission Type (Psych Patients Only)  Admission Status Voluntary  Psychosocial Assessment  Patient Complaints Anxiety  Eye Contact Fair  Facial Expression Animated  Affect Anxious  Speech Logical/coherent  Interaction Assertive  Motor Activity Other (Comment) (WDL)  Appearance/Hygiene Unremarkable  Behavior Characteristics Cooperative;Anxious  Mood Anxious  Thought Process  Coherency WDL  Content WDL  Delusions None reported or observed  Perception WDL  Hallucination None reported or observed  Judgment Impaired  Confusion None  Danger to Self  Current suicidal ideation? Denies  Agreement Not to Harm Self Yes  Description of Agreement Verbal  Danger to Others  Danger to Others None reported or observed

## 2024-04-20 NOTE — Plan of Care (Signed)
   Problem: Education: Goal: Ability to state activities that reduce stress will improve Outcome: Progressing   Problem: Coping: Goal: Ability to identify and develop effective coping behavior will improve Outcome: Progressing   Problem: Self-Concept: Goal: Ability to identify factors that promote anxiety will improve Outcome: Progressing

## 2024-04-20 NOTE — Progress Notes (Signed)
 [facility] MD Progress Note  04/20/2024 10:45 AM ANTOINE VANDERMEULEN  MRN:  [id number]   Subjective:  Chart reviewed, case discussed in multidisciplinary meeting, patient seen during rounds.   10/12: Patient seen today for follow-up they are alert and oriented.  They are pleasant and cooperative on exam.  They are linear logical and future oriented.  They are in anxiety is 5 out of 10.  They rated depression as 4 out of 10. Feels like he is here because he pulled back from his support team, feels he always runs in the time of stress. He does not want to call his family feels like he is a burden, but indicate he wants support.Wants to reach out to his sons, and this is encouraged. Denies adverse effects of medications. Denies SI/HI/AVH. Is tearful and feels that his family does not understand mental health. Sleep and appetite are stable. Continues to identify divorce as primary sources of stress.   10/11: Patient seen today for follow-up they are alert and oriented.  They are pleasant and cooperative on exam.  They are linear logical and future oriented.  They are in anxiety is 4 out of 10.  They rated depression as 3 out of 10.  They deny SI, HI, and AVH.  They report they only missed 2 days of their Effexor  and he had been taking this current dose for about a month.  They are agreeable to dose increase to 150 milligrams reviewed potential adverse effects.  They are agreeable with the plan.  Encouraged patient to work on developing coping mechanisms.  Discussed the importance of aftercare.  Encouraged him to work on identifying their social support system.  They report stable sleep and appetite.  They voiced no concerns or complaints.   Sleep: Fair  Appetite:  Fair  Past Psychiatric History: see h&P Family History:  Family History  Problem Relation Age of Onset   Cancer Mother    Stroke Father    Hypercholesterolemia Father    Alcohol abuse Father    Diabetes Sister    Alcohol abuse Sister     Diabetes Brother    Hypertension Other    Cancer - Lung Other    Social History:  Social History   Substance and Sexual Activity  Alcohol Use Yes   Comment: Social drinking     Social History   Substance and Sexual Activity  Drug Use Not Currently   Comment: No active drug use. History of past drug use.    Social History   Socioeconomic History   Marital status: Legally Separated    Spouse name: Not on file   Number of children: 3   Years of education: Not on file   Highest education level: 9th grade  Occupational History   Occupation: Emergency planning/management officer  Tobacco Use   Smoking status: Former    Types: Cigarettes   Smokeless tobacco: Never   Tobacco comments:    at age 62  Vaping Use   Vaping status: Every Day   Devices: THC  Substance and Sexual Activity   Alcohol use: Yes    Comment: Social drinking   Drug use: Not Currently    Comment: No active drug use. History of past drug use.   Sexual activity: Yes  Other Topics Concern   Not on file  Social History Narrative   Patient lives in [place] with his wife.   Had education up to 10th grade.   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   Has 3 kids. All healthy.         Social Drivers of Corporate investment banker Strain: Not on file  Food Insecurity: No Food Insecurity (04/17/2024)   Hunger Vital Sign    Worried About Running Out of Food in the Last Year: Never true    Ran Out of Food in the Last Year: Never true  Transportation Needs: No Transportation Needs (04/17/2024)   PRAPARE - Administrator, Civil Service (Medical): No    Lack of Transportation (Non-Medical): No  Physical Activity: Not on file  Stress: Not on file  Social Connections: Unknown (11/18/2021)   Received from [facility]   Social Network    Social Network: Not on file   Past Medical History:  Past Medical History:  Diagnosis Date   Anxiety    Attention deficit disorder    childhood   GERD  (gastroesophageal reflux disease)    Headache(784.0)    Lumbar disc herniation    Muscle spasm of back 06/21/2012   Right inguinal hernia 07/14/2013    Past Surgical History:  Procedure Laterality Date   HERNIA REPAIR     INGUINAL HERNIA REPAIR Right 07/16/2013   Procedure: HERNIA REPAIR INGUINAL ADULT;  Surgeon: Elon CHRISTELLA Pacini, MD;  Location: [facility] OR;  Service: General;  Laterality: Right;   INSERTION OF MESH Right 07/16/2013   Procedure: INSERTION OF MESH;  Surgeon: Elon CHRISTELLA Pacini, MD;  Location: [facility] OR;  Service: General;  Laterality: Right;   LUMBAR EPIDURAL INJECTION     Hx; of for pain   TONSILLECTOMY      Current Medications: Current Facility-Administered Medications  Medication Dose Route Frequency Provider Last Rate Last Admin   acetaminophen  (TYLENOL ) tablet 650 mg  650 mg Oral Q6H PRN Smith, Annie B, NP       alum & mag hydroxide-simeth (MAALOX/MYLANTA) 200-200-20 MG/5ML suspension 30 mL  30 mL Oral Q4H PRN Smith, Annie B, NP       haloperidol (HALDOL) tablet 5 mg  5 mg Oral TID PRN Smith, Annie B, NP       And   diphenhydrAMINE  (BENADRYL ) capsule 50 mg  50 mg Oral TID PRN Smith, Annie B, NP       haloperidol lactate (HALDOL) injection 5 mg  5 mg Intramuscular TID PRN Smith, Annie B, NP       And   diphenhydrAMINE  (BENADRYL ) injection 50 mg  50 mg Intramuscular TID PRN Smith, Annie B, NP       And   LORazepam (ATIVAN) injection 2 mg  2 mg Intramuscular TID PRN Smith, Annie B, NP       haloperidol lactate (HALDOL) injection 10 mg  10 mg Intramuscular TID PRN Smith, Annie B, NP       And   diphenhydrAMINE  (BENADRYL ) injection 50 mg  50 mg Intramuscular TID PRN Smith, Annie B, NP       And   LORazepam (ATIVAN) injection 2 mg  2 mg Intramuscular TID PRN Smith, Annie B, NP       hydrOXYzine  (ATARAX ) tablet 50 mg  50 mg Oral Q6H PRN Jadapalle, Sree, MD   50 mg at 04/19/24 1735   influenza vac split trivalent PF (FLUZONE) injection 0.5 mL  0.5 mL Intramuscular Tomorrow-1000  Donnelly Mellow, MD       magnesium  hydroxide (MILK OF MAGNESIA) suspension 30 mL  30 mL Oral Daily PRN Smith, Annie B, NP  venlafaxine  XR (EFFEXOR -XR) 24 hr capsule 150 mg  150 mg Oral Q breakfast Mackay Hanauer E, PA-C   150 mg at 04/20/24 9060    Lab Results: No results found for this or any previous visit (from the past 48 hours).  Blood Alcohol level:  Lab Results  Component Value Date   ETH 173 (H) 04/17/2024   ETH <15 03/10/2024    Metabolic Disorder Labs: Lab Results  Component Value Date   HGBA1C 5.4 03/12/2024   MPG 108.28 03/12/2024   No results found for: PROLACTIN Lab Results  Component Value Date   CHOL 182 01/02/2023   TRIG 175.0 (H) 01/02/2023   HDL 59.20 01/02/2023   CHOLHDL 3 01/02/2023   VLDL 35.0 01/02/2023   LDLCALC 88 01/02/2023    Physical Findings: AIMS:  , ,  ,  ,    CIWA:    COWS:      Psychiatric Specialty Exam:  Presentation  General Appearance:  Appropriate for Environment  Eye Contact: Fair  Speech: Clear and Coherent  Speech Volume: Normal    Mood and Affect  Mood: Anxious; Depressed  Affect: Congruent   Thought Process  Thought Processes: Coherent  Descriptions of Associations:Intact  Orientation:Full (Time, Place and Person)  Thought Content:WDL  Hallucinations:No data recorded  Ideas of Reference:None  Suicidal Thoughts:No data recorded  Homicidal Thoughts:No data recorded   Sensorium  Memory: Immediate Fair; Recent Fair; Remote Fair  Judgment: Fair  Insight: Fair   Art therapist  Concentration: Poor  Attention Span: Fair  Recall: [employer] of Knowledge: Fair  Language: Fair   Psychomotor Activity  Psychomotor Activity: No data recorded  Musculoskeletal: Strength & Muscle Tone: within normal limits Gait & Station: normal Assets  Assets: Manufacturing systems engineer; Desire for Improvement; Housing; Social Support    Physical Exam: Physical  Exam Vitals and nursing note reviewed.  HENT:     Head: Atraumatic.  Eyes:     Extraocular Movements: Extraocular movements intact.  Pulmonary:     Effort: Pulmonary effort is normal.  Neurological:     Mental Status: He is alert and oriented to person, place, and time.    Review of Systems  Psychiatric/Behavioral:  Positive for depression. Negative for hallucinations and suicidal ideas. The patient is nervous/anxious. The patient does not have insomnia.    Blood pressure 106/88, pulse 72, temperature (!) 96.9 F (36.1 C), resp. rate 18, SpO2 99%. There is no height or weight on file to calculate BMI.  Diagnosis: Principal Problem:   Depressive disorder   PLAN: Safety and Monitoring:  -- Voluntary admission to inpatient psychiatric unit for safety, stabilization and treatment  -- Daily contact with patient to assess and evaluate symptoms and progress in treatment  -- Patient's case to be discussed in multi-disciplinary team meeting  -- Observation Level : q15 minute checks  -- Vital signs:  q12 hours  -- Precautions: suicide, elopement, and assault -- Encouraged patient to participate in unit milieu and in scheduled group therapies  2. Psychiatric Diagnoses and Treatment:       MDD, recurrent, severe             PTSD First dose of effexor  150 on 10/12 Continue hydroxyzine  50 mg every 6 hours as needed for anxiety   -- The risks/benefits/side-effects/alternatives to this medication were discussed in detail with the patient and time was given for questions. The patient consents to medication trial.                --  Metabolic profile and EKG monitoring obtained while on an atypical antipsychotic (BMI: Lipid Panel: HbgA1c: QTc:)              -- Encouraged patient to participate in unit milieu and in scheduled group therapies                            3. Medical Issues Being Addressed:  No acute medical issues identified at this time 4. Discharge Planning:   -- Social work  and case management to assist with discharge planning and identification of hospital follow-up needs prior to discharge  -- Estimated LOS: 3-4 days  Donnice FORBES Right, PA-C 04/20/2024, 10:45 AM

## 2024-04-20 NOTE — Progress Notes (Signed)
   04/20/24 1100  Psych Admission Type (Psych Patients Only)  Admission Status Voluntary  Psychosocial Assessment  Patient Complaints Anxiety;Restlessness  Eye Contact Fair  Facial Expression Flat  Affect Anxious  Speech Logical/coherent  Interaction Assertive  Motor Activity Other (Comment) (WDL)  Appearance/Hygiene Unremarkable  Behavior Characteristics Cooperative  Mood Anxious  Thought Process  Coherency WDL  Content WDL  Delusions None reported or observed  Perception WDL  Hallucination None reported or observed  Judgment Impaired  Confusion WDL  Danger to Self  Current suicidal ideation? Denies  Agreement Not to Harm Self Yes  Description of Agreement verbal  Danger to Others  Danger to Others None reported or observed

## 2024-04-20 NOTE — Plan of Care (Signed)
 Carrie C Komar is a 48 y.o. male patient. No diagnosis found. Past Medical History:  Diagnosis Date   Anxiety    Attention deficit disorder    childhood   GERD (gastroesophageal reflux disease)    Headache(784.0)    Lumbar disc herniation    Muscle spasm of back 06/21/2012   Right inguinal hernia 07/14/2013   Current Facility-Administered Medications  Medication Dose Route Frequency Provider Last Rate Last Admin   acetaminophen  (TYLENOL ) tablet 650 mg  650 mg Oral Q6H PRN Smith, Annie B, NP       alum & mag hydroxide-simeth (MAALOX/MYLANTA) 200-200-20 MG/5ML suspension 30 mL  30 mL Oral Q4H PRN Smith, Annie B, NP       haloperidol (HALDOL) tablet 5 mg  5 mg Oral TID PRN Smith, Annie B, NP       And   diphenhydrAMINE  (BENADRYL ) capsule 50 mg  50 mg Oral TID PRN Smith, Annie B, NP       haloperidol lactate (HALDOL) injection 5 mg  5 mg Intramuscular TID PRN Smith, Annie B, NP       And   diphenhydrAMINE  (BENADRYL ) injection 50 mg  50 mg Intramuscular TID PRN Smith, Annie B, NP       And   LORazepam (ATIVAN) injection 2 mg  2 mg Intramuscular TID PRN Smith, Annie B, NP       haloperidol lactate (HALDOL) injection 10 mg  10 mg Intramuscular TID PRN Smith, Annie B, NP       And   diphenhydrAMINE  (BENADRYL ) injection 50 mg  50 mg Intramuscular TID PRN Smith, Annie B, NP       And   LORazepam (ATIVAN) injection 2 mg  2 mg Intramuscular TID PRN Smith, Annie B, NP       hydrOXYzine  (ATARAX ) tablet 50 mg  50 mg Oral Q6H PRN Jadapalle, Sree, MD   50 mg at 04/19/24 1735   influenza vac split trivalent PF (FLUZONE) injection 0.5 mL  0.5 mL Intramuscular Tomorrow-1000 Donnelly Mellow, MD       magnesium  hydroxide (MILK OF MAGNESIA) suspension 30 mL  30 mL Oral Daily PRN Smith, Annie B, NP       venlafaxine  XR (EFFEXOR -XR) 24 hr capsule 150 mg  150 mg Oral Q breakfast Millington, Matthew E, PA-C       No Known Allergies Principal Problem:   Depressive disorder  Blood pressure 103/82, pulse  68, temperature (!) 97 F (36.1 C), resp. rate 18, SpO2 100%.  Subjective Objective Assessment & Plan  Shakura Cowing B Shanvi Moyd 04/20/2024

## 2024-04-20 NOTE — Plan of Care (Signed)
  Problem: Self-Concept: Goal: Ability to identify factors that promote anxiety will improve Outcome: Progressing Goal: Level of anxiety will decrease Outcome: Progressing

## 2024-04-20 NOTE — Group Note (Signed)
 LCSW Group Therapy Note  Group Date: 04/20/2024 Start Time: 1300 End Time: 1400   Type of Therapy and Topic:  Group Therapy: Positive Affirmations  Participation Level:  Active   Description of Group:   This group addressed positive affirmation towards self and others.  Patients went around the room and identified two positive things about themselves and two positive things about a peer in the room.  Patients reflected on how it felt to share something positive with others, to identify positive things about themselves, and to hear positive things from others/ Patients were encouraged to have a daily reflection of positive characteristics or circumstances.   Therapeutic Goals: Patients will verbalize two of their positive qualities Patients will demonstrate empathy for others by stating two positive qualities about a peer in the group Patients will verbalize their feelings when voicing positive self affirmations and when voicing positive affirmations of others Patients will discuss the potential positive impact on their wellness/recovery of focusing on positive traits of self and others.  Summary of Patient Progress:  Patient actively engaged in the discussion and was able to identify positive affirmations about themselves as well as other group members. Patient demonstrated proficient insight into the subject matter, was respectful of peers, participated throughout the entire session.  Therapeutic Modalities:   Cognitive Behavioral Therapy Motivational Interviewing    Alveta CHRISTELLA Kerns, ISRAEL 04/20/2024  2:58 PM

## 2024-04-21 DIAGNOSIS — F332 Major depressive disorder, recurrent severe without psychotic features: Principal | ICD-10-CM

## 2024-04-21 NOTE — Group Note (Signed)
 Recreation Therapy Group Note   Group Topic:Healthy Support Systems  Group Date: 04/21/2024 Start Time: 1015 End Time: 1100 Facilitators: Celestia Jeoffrey BRAVO, LRT, CTRS Location: Craft Room  Group Description: Straw Bridge. In groups or individually, patients were given 10 plastic drinking straws and an equal length of masking tape. Using the materials provided, patients were instructed to build a free-standing bridge-like structure to suspend an everyday item (ex: deck of cards) off the floor or table surface. All materials were required to be used in Secondary school teacher. LRT facilitated post-activity discussion reviewing the importance of having strong and healthy support systems in our lives. LRT discussed how the people in our lives serve as the tape and the deck of cards we placed on top of our straw structure are the stressors we face in daily life. LRT and pts discussed what happens in our life when things get too heavy for us , and we don't have strong supports outside of the hospital. Pt shared 2 of their healthy supports in their life aloud in the group.   Goal Area(s) Addressed:  Patient will identify 2 healthy supports in their life. Patient will identify skills to successfully complete activity. Patient will identify correlation of this activity to life post-discharge.  Patient will build on frustration tolerance skills. Patient will increase team building and communication skills.    Affect/Mood: Appropriate   Participation Level: Active and Engaged   Participation Quality: Independent   Behavior: Calm and Cooperative   Speech/Thought Process: Coherent   Insight: Good   Judgement: Good   Modes of Intervention: STEM Activity   Patient Response to Interventions:  Attentive, Engaged, and Receptive   Education Outcome:  Acknowledges education   Clinical Observations/Individualized Feedback: Anthony Harris was active in their participation of session activities and group discussion. Pt  identified my wife, sister and aunt as healthy supports.    Plan: Continue to engage patient in RT group sessions 2-3x/week.   Jeoffrey BRAVO Celestia, LRT, CTRS 04/21/2024 1:11 PM

## 2024-04-21 NOTE — Plan of Care (Signed)
   Problem: Coping: Goal: Ability to identify and develop effective coping behavior will improve Outcome: Progressing   Problem: Self-Concept: Goal: Level of anxiety will decrease Outcome: Progressing

## 2024-04-21 NOTE — Progress Notes (Signed)
   04/21/24 1945  Psych Admission Type (Psych Patients Only)  Admission Status Voluntary  Psychosocial Assessment  Patient Complaints Anxiety (reports significant decrease in anxiety)  Eye Contact Fair  Facial Expression Flat  Affect Anxious  Speech Logical/coherent  Interaction Assertive  Motor Activity Other (Comment) (WDL)  Appearance/Hygiene Unremarkable  Behavior Characteristics Cooperative;Anxious  Mood Pleasant  Thought Process  Coherency WDL  Content WDL  Delusions None reported or observed  Perception WDL  Hallucination None reported or observed  Judgment WDL  Confusion None  Danger to Self  Current suicidal ideation? Denies  Agreement Not to Harm Self Yes  Description of Agreement verbal  Danger to Others  Danger to Others None reported or observed

## 2024-04-21 NOTE — Plan of Care (Signed)
  Problem: Coping: Goal: Ability to identify and develop effective coping behavior will improve Outcome: Progressing   Problem: Self-Concept: Goal: Ability to modify response to factors that promote anxiety will improve Outcome: Progressing   

## 2024-04-21 NOTE — Group Note (Signed)
 Date:  04/21/2024 Time:  8:55 PM  Group Topic/Focus:  Recovery Goals:   The focus of this group is to identify appropriate goals for recovery and establish a plan to achieve them.    Participation Level:  Active  Participation Quality:  Appropriate, Attentive, Sharing, and Supportive  Affect:  Appropriate  Cognitive:  Alert and Appropriate  Insight: Appropriate, Good, and Improving  Engagement in Group:  Developing/Improving, Engaged, and Supportive  Modes of Intervention:  Clarification, Discussion, Education, Problem-solving, Rapport Building, Dance movement psychotherapist, and Support  Additional Comments:     Coye Dawood 04/21/2024, 8:55 PM

## 2024-04-21 NOTE — Group Note (Signed)
 LCSW Group Therapy Note  Group Date: 04/21/2024 Start Time: 1315 End Time: 1400   Type of Therapy and Topic:  Group Therapy - How To Cope with Nervousness about Discharge   Participation Level:  Active   Description of Group This process group involved identification of patients' feelings about discharge. Some of them are scheduled to be discharged soon, while others are new admissions, but each of them was asked to share thoughts and feelings surrounding discharge from the hospital. One common theme was that they are excited at the prospect of going home, while another was that many of them are apprehensive about sharing why they were hospitalized. Patients were given the opportunity to discuss these feelings with their peers in preparation for discharge.  Therapeutic Goals  Patient will identify their overall feelings about pending discharge. Patient will think about how they might proactively address issues that they believe will once again arise once they get home (i.e. with parents). Patients will participate in discussion about having hope for change.   Summary of Patient Progress:   Patient was active throughout the session. He demonstrated fair insight into the subject matter, and proved open to input from peers and feedback from CSW. He was respectful of peers and participated throughout the entire session.   Therapeutic Modalities Cognitive Behavioral Therapy   Sherryle JINNY Margo, LCSW 04/21/2024  3:01 PM

## 2024-04-21 NOTE — Progress Notes (Signed)
   04/21/24 1300  Psych Admission Type (Psych Patients Only)  Admission Status Voluntary  Psychosocial Assessment  Patient Complaints Anxiety (Patient reports increased anxiety related to family concerns.)  Eye Contact Fair  Facial Expression Animated;Trembling lip;Anxious  Affect Anxious  Speech Logical/coherent  Interaction Assertive  Motor Activity Other (Comment) (appropriate for developmental age)  Appearance/Hygiene Unremarkable  Behavior Characteristics Cooperative;Anxious  Mood Anxious;Pleasant  Thought Process  Coherency WDL  Content WDL  Delusions None reported or observed  Perception WDL  Hallucination None reported or observed  Judgment WDL (improving)  Confusion None  Danger to Self  Current suicidal ideation? Denies  Self-Injurious Behavior No self-injurious ideation or behavior indicators observed or expressed   Danger to Others  Danger to Others None reported or observed

## 2024-04-21 NOTE — Progress Notes (Addendum)
 [facility] MD Progress Note  04/21/2024 4:32 PM Anthony Harris  MRN:  [id number]   Subjective:  Chart reviewed, case discussed in multidisciplinary meeting, patient seen during rounds.   10/13: On interview today, patient is noted to be interacting in common area with peers.  He is calm and cooperative, alert and oriented.  He reports increased situational anxiety today due to learning of news of birth of son's baby during which there were complications.  Patient is waiting to hear an update from his sister or ex-wife.  He rates anxiety as a 4-7 out of 10 and depression as 5 out of 10 today.  He denies SI/HI/plan and denies hallucinations.  He reports tolerating Effexor  dose increase, though does report mild, tolerable headache.  He would like to continue Effexor  at this time and continue to monitor.  He reports decreased appetite and fair sleep.  He feels he is maintaining adequate nutrition despite decreased appetite.  Patient reports he has been active in helping others on the unit and states he plans to donate clothes and other items upon his discharge.  He is future oriented, stating he feels inspired and motivated to help others moving forward, especially those who are struggling with mental health issues.  10/12: Patient seen today for follow-up they are alert and oriented.  They are pleasant and cooperative on exam.  They are linear logical and future oriented.  They are in anxiety is 5 out of 10.  They rated depression as 4 out of 10. Feels like he is here because he pulled back from his support team, feels he always runs in the time of stress. He does not want to call his family feels like he is a burden, but indicate he wants support.Wants to reach out to his sons, and this is encouraged. Denies adverse effects of medications. Denies SI/HI/AVH. Is tearful and feels that his family does not understand mental health. Sleep and appetite are stable. Continues to identify divorce as primary sources of  stress.   10/11: Patient seen today for follow-up they are alert and oriented.  They are pleasant and cooperative on exam.  They are linear logical and future oriented.  They are in anxiety is 4 out of 10.  They rated depression as 3 out of 10.  They deny SI, HI, and AVH.  They report they only missed 2 days of their Effexor  and he had been taking this current dose for about a month.  They are agreeable to dose increase to 150 milligrams reviewed potential adverse effects.  They are agreeable with the plan.  Encouraged patient to work on developing coping mechanisms.  Discussed the importance of aftercare.  Encouraged him to work on identifying their social support system.  They report stable sleep and appetite.  They voiced no concerns or complaints.   Sleep: Fair  Appetite:  Fair  Past Psychiatric History: see h&P Family History:  Family History  Problem Relation Age of Onset   Cancer Mother    Stroke Father    Hypercholesterolemia Father    Alcohol abuse Father    Diabetes Sister    Alcohol abuse Sister    Diabetes Brother    Hypertension Other    Cancer - Lung Other    Social History:  Social History   Substance and Sexual Activity  Alcohol Use Yes   Comment: Social drinking     Social History   Substance and Sexual Activity  Drug Use Not Currently   Comment:  No active drug use. History of past drug use.    Social History   Socioeconomic History   Marital status: Legally Separated    Spouse name: Not on file   Number of children: 3   Years of education: Not on file   Highest education level: 9th grade  Occupational History   Occupation: Emergency planning/management officer  Tobacco Use   Smoking status: Former    Types: Cigarettes   Smokeless tobacco: Never   Tobacco comments:    at age 59  Vaping Use   Vaping status: Every Day   Devices: THC  Substance and Sexual Activity   Alcohol use: Yes    Comment: Social drinking   Drug use: Not Currently    Comment: No active drug  use. History of past drug use.   Sexual activity: Yes  Other Topics Concern   Not on file  Social History Narrative   Patient lives in [place] with his wife.   Had education up to 10th grade.   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   Has 3 kids. All healthy.         Social Drivers of Corporate investment banker Strain: Not on file  Food Insecurity: No Food Insecurity (04/17/2024)   Hunger Vital Sign    Worried About Running Out of Food in the Last Year: Never true    Ran Out of Food in the Last Year: Never true  Transportation Needs: No Transportation Needs (04/17/2024)   PRAPARE - Administrator, Civil Service (Medical): No    Lack of Transportation (Non-Medical): No  Physical Activity: Not on file  Stress: Not on file  Social Connections: Unknown (11/18/2021)   Received from [facility]   Social Network    Social Network: Not on file   Past Medical History:  Past Medical History:  Diagnosis Date   Anxiety    Attention deficit disorder    childhood   GERD (gastroesophageal reflux disease)    Headache(784.0)    Lumbar disc herniation    Muscle spasm of back 06/21/2012   Right inguinal hernia 07/14/2013    Past Surgical History:  Procedure Laterality Date   HERNIA REPAIR     INGUINAL HERNIA REPAIR Right 07/16/2013   Procedure: HERNIA REPAIR INGUINAL ADULT;  Surgeon: Elon CHRISTELLA Pacini, MD;  [facility];  Service: General;  Laterality: Right;   INSERTION OF MESH Right 07/16/2013   Procedure: INSERTION OF MESH;  Surgeon: Elon CHRISTELLA Pacini, MD;  Location: [facility] OR;  Service: General;  Laterality: Right;   LUMBAR EPIDURAL INJECTION     Hx; of for pain   TONSILLECTOMY      Current Medications: Current Facility-Administered Medications  Medication Dose Route Frequency Provider Last Rate Last Admin   acetaminophen  (TYLENOL ) tablet 650 mg  650 mg Oral Q6H PRN Smith, Annie B, NP       alum & mag hydroxide-simeth (MAALOX/MYLANTA) 200-200-20  MG/5ML suspension 30 mL  30 mL Oral Q4H PRN Smith, Annie B, NP       haloperidol (HALDOL) tablet 5 mg  5 mg Oral TID PRN Smith, Annie B, NP       And   diphenhydrAMINE  (BENADRYL ) capsule 50 mg  50 mg Oral TID PRN Smith, Annie B, NP       haloperidol lactate (HALDOL) injection 5 mg  5 mg Intramuscular TID PRN Smith, Annie B, NP       And  diphenhydrAMINE  (BENADRYL ) injection 50 mg  50 mg Intramuscular TID PRN Smith, Annie B, NP       And   LORazepam (ATIVAN) injection 2 mg  2 mg Intramuscular TID PRN Smith, Annie B, NP       haloperidol lactate (HALDOL) injection 10 mg  10 mg Intramuscular TID PRN Smith, Annie B, NP       And   diphenhydrAMINE  (BENADRYL ) injection 50 mg  50 mg Intramuscular TID PRN Smith, Annie B, NP       And   LORazepam (ATIVAN) injection 2 mg  2 mg Intramuscular TID PRN Smith, Annie B, NP       hydrOXYzine  (ATARAX ) tablet 50 mg  50 mg Oral Q6H PRN Jadapalle, Sree, MD   50 mg at 04/21/24 9171   influenza vac split trivalent PF (FLUZONE) injection 0.5 mL  0.5 mL Intramuscular Tomorrow-1000 Donnelly Mellow, MD       lidocaine  (LIDODERM ) 5 % 1 patch  1 patch Transdermal Q24H Millington, Matthew E, PA-C   1 patch at 04/21/24 1206   magnesium  hydroxide (MILK OF MAGNESIA) suspension 30 mL  30 mL Oral Daily PRN Smith, Annie B, NP       phenylephrine-shark liver oil-mineral oil-petrolatum (PREPARATION H) rectal ointment 1 Application  1 Application Rectal BID PRN Millington, Matthew E, PA-C       venlafaxine  XR (EFFEXOR -XR) 24 hr capsule 150 mg  150 mg Oral Q breakfast Millington, Matthew E, PA-C   150 mg at 04/21/24 9171    Lab Results: No results found for this or any previous visit (from the past 48 hours).  Blood Alcohol level:  Lab Results  Component Value Date   ETH 173 (H) 04/17/2024   ETH <15 03/10/2024    Metabolic Disorder Labs: Lab Results  Component Value Date   HGBA1C 5.4 03/12/2024   MPG 108.28 03/12/2024   No results found for: PROLACTIN Lab Results   Component Value Date   CHOL 182 01/02/2023   TRIG 175.0 (H) 01/02/2023   HDL 59.20 01/02/2023   CHOLHDL 3 01/02/2023   VLDL 35.0 01/02/2023   LDLCALC 88 01/02/2023    Physical Findings: AIMS:  , ,  ,  ,    CIWA:    COWS:      Psychiatric Specialty Exam:  Presentation  General Appearance:  Appropriate for Environment  Eye Contact: Fair  Speech: Clear and Coherent  Speech Volume: Normal    Mood and Affect  Mood: Anxious  Affect: Congruent   Thought Process  Thought Processes: Coherent  Descriptions of Associations:Intact  Orientation:Full (Time, Place and Person)  Thought Content:WDL  Hallucinations: None  Ideas of Reference:None  Suicidal Thoughts: No  Homicidal Thoughts: No   Sensorium  Memory: Immediate Fair; Recent Fair; Remote Fair  Judgment: Fair  Insight: Fair   Art therapist  Concentration: Fair  Attention Span: Fair  Recall: [employer] of Knowledge: Fair  Language: Fair   Psychomotor Activity  Psychomotor Activity: Normal  Musculoskeletal: Strength & Muscle Tone: within normal limits Gait & Station: normal Assets  Assets: Manufacturing systems engineer; Desire for Improvement; Housing; Social Support    Physical Exam: Physical Exam Vitals and nursing note reviewed.  HENT:     Head: Atraumatic.  Eyes:     Extraocular Movements: Extraocular movements intact.  Pulmonary:     Effort: Pulmonary effort is normal.  Neurological:     Mental Status: He is alert and oriented to person, place, and time.  Review of Systems  Psychiatric/Behavioral:  Positive for depression. Negative for hallucinations and suicidal ideas. The patient is nervous/anxious. The patient does not have insomnia.    Blood pressure 115/84, pulse 76, temperature 97.7 F (36.5 C), resp. rate 18, SpO2 100%. There is no height or weight on file to calculate BMI.  Diagnosis: Principal Problem:   MDD (major depressive disorder),  recurrent severe, without psychosis (HCC)   PLAN: Safety and Monitoring:  -- Voluntary admission to inpatient psychiatric unit for safety, stabilization and treatment  -- Daily contact with patient to assess and evaluate symptoms and progress in treatment  -- Patient's case to be discussed in multi-disciplinary team meeting  -- Observation Level : q15 minute checks  -- Vital signs:  q12 hours  -- Precautions: suicide, elopement, and assault -- Encouraged patient to participate in unit milieu and in scheduled group therapies  2. Psychiatric Diagnoses and Treatment:       MDD, recurrent, severe             PTSD First dose of effexor  150 on 10/12, continue once daily Continue hydroxyzine  50 mg every 6 hours as needed for anxiety   -- The risks/benefits/side-effects/alternatives to this medication were discussed in detail with the patient and time was given for questions. The patient consents to medication trial.                -- Metabolic profile and EKG monitoring obtained while on an atypical antipsychotic (BMI: Lipid Panel: HbgA1c: QTc:)              -- Encouraged patient to participate in unit milieu and in scheduled group therapies                            3. Medical Issues Being Addressed:  No acute medical issues identified at this time 4. Discharge Planning:             -- Late week  -- Social work and case management to assist with discharge planning and identification of hospital follow-up needs prior to discharge  -- Estimated LOS: 5-7 days  Camelia LITTIE Lukes, PA-C 04/21/2024, 4:32 PM

## 2024-04-21 NOTE — Group Note (Signed)
 Date:  04/21/2024 Time:  6:40 PM  Group Topic/Focus:  Activity Group :  The focus of the group is to promote activity for the patients and encourage them to go outside    Participation Level:  Active  Participation Quality:  Appropriate  Affect:  Appropriate  Cognitive:  Appropriate  Insight: Appropriate  Engagement in Group:  Engaged  Modes of Intervention:  Activity  Additional Comments:    Anthony Harris Anthony Harris 04/21/2024, 6:40 PM

## 2024-04-21 NOTE — Group Note (Signed)
 Date:  04/21/2024 Time:  10:02 AM  Group Topic/Focus:  Goals Group:   The focus of this group is to help patients establish daily goals to achieve during treatment and discuss how the patient can incorporate goal setting into their daily lives to aide in recovery.    Participation Level:  Active  Participation Quality:  Appropriate  Affect:  Appropriate  Cognitive:  Appropriate  Insight: Appropriate  Engagement in Group:  Engaged  Modes of Intervention:  Activity  Additional Comments:    Anthony Harris 04/21/2024, 10:02 AM

## 2024-04-21 NOTE — Group Note (Signed)
 Date:  04/21/2024 Time:  2:15 AM  Group Topic/Focus:  Managing Feelings:   The focus of this group is to identify what feelings patients have difficulty handling and develop a plan to handle them in a healthier way upon discharge. Self Care:   The focus of this group is to help patients understand the importance of self-care in order to improve or restore emotional, physical, spiritual, interpersonal, and financial health.    Participation Level:  Active  Participation Quality:  Appropriate and Attentive  Affect:  Appropriate  Cognitive:  Alert, Appropriate, and Oriented  Insight: Appropriate and Good  Engagement in Group:  Engaged  Modes of Intervention:  Discussion and Support  Additional Comments:  N/A  Butler LITTIE Gelineau 04/21/2024, 2:15 AM

## 2024-04-22 DIAGNOSIS — F332 Major depressive disorder, recurrent severe without psychotic features: Secondary | ICD-10-CM | POA: Diagnosis not present

## 2024-04-22 NOTE — Group Note (Signed)
 Date:  04/22/2024 Time:  9:10 PM  Group Topic/Focus:  Wrap-Up Group:   The focus of this group is to help patients review their daily goal of treatment and discuss progress on daily workbooks.    Participation Level:  Active  Participation Quality:  Appropriate and Attentive  Affect:  Appropriate  Cognitive:  Alert and Appropriate  Insight: Appropriate and Good  Engagement in Group:  Engaged  Modes of Intervention:  Orientation  Additional Comments:     Arlester CHRISTELLA Servant 04/22/2024, 9:10 PM

## 2024-04-22 NOTE — Progress Notes (Signed)
 [facility] MD Progress Note  04/22/2024 8:28 PM Anthony Harris  MRN:  [id number]   Subjective:  Chart reviewed, case discussed in multidisciplinary meeting, patient seen during rounds.   10/14: On interview today, patient is noted to be pleasant, calm and cooperative.  He reports improved depression and anxiety today. He denies SI/HI/plan and denies hallucinations.  He states I feel more like myself than I have in a long time.  He has been actively participating in group therapies and unit activities.  He reports he is no longer having racing thoughts and has needed hydroxyzine  less frequently for anxiety.  He reports tolerating current medication regimen well overall.  He reports improvement in headaches.  He is future oriented, stating he is looking forward to meeting his newborn granddaughter.  Patient shares that he enjoys helping others and is finding fulfillment in this.  10/13: On interview today, patient is noted to be interacting in common area with peers.  He is calm and cooperative, alert and oriented.  He reports increased situational anxiety today due to learning of news of birth of son's baby during which there were complications.  Patient is waiting to hear an update from his sister or ex-wife.  He rates anxiety as a 4-7 out of 10 and depression as 5 out of 10 today.  He denies SI/HI/plan and denies hallucinations.  He reports tolerating Effexor  dose increase, though does report mild, tolerable headache.  He would like to continue Effexor  at this time and continue to monitor.  He reports decreased appetite and fair sleep.  He feels he is maintaining adequate nutrition despite decreased appetite.  Patient reports he has been active in helping others on the unit and states he plans to donate clothes and other items upon his discharge.  He is future oriented, stating he feels inspired and motivated to help others moving forward, especially those who are struggling with mental health  issues.  10/12: Patient seen today for follow-up they are alert and oriented.  They are pleasant and cooperative on exam.  They are linear logical and future oriented.  They are in anxiety is 5 out of 10.  They rated depression as 4 out of 10. Feels like he is here because he pulled back from his support team, feels he always runs in the time of stress. He does not want to call his family feels like he is a burden, but indicate he wants support.Wants to reach out to his sons, and this is encouraged. Denies adverse effects of medications. Denies SI/HI/AVH. Is tearful and feels that his family does not understand mental health. Sleep and appetite are stable. Continues to identify divorce as primary sources of stress.   10/11: Patient seen today for follow-up they are alert and oriented.  They are pleasant and cooperative on exam.  They are linear logical and future oriented.  They are in anxiety is 4 out of 10.  They rated depression as 3 out of 10.  They deny SI, HI, and AVH.  They report they only missed 2 days of their Effexor  and he had been taking this current dose for about a month.  They are agreeable to dose increase to 150 milligrams reviewed potential adverse effects.  They are agreeable with the plan.  Encouraged patient to work on developing coping mechanisms.  Discussed the importance of aftercare.  Encouraged him to work on identifying their social support system.  They report stable sleep and appetite.  They voiced no concerns or  complaints.   Sleep: Fair  Appetite:  Fair  Past Psychiatric History: see h&P Family History:  Family History  Problem Relation Age of Onset   Cancer Mother    Stroke Father    Hypercholesterolemia Father    Alcohol abuse Father    Diabetes Sister    Alcohol abuse Sister    Diabetes Brother    Hypertension Other    Cancer - Lung Other    Social History:  Social History   Substance and Sexual Activity  Alcohol Use Yes   Comment: Social drinking      Social History   Substance and Sexual Activity  Drug Use Not Currently   Comment: No active drug use. History of past drug use.    Social History   Socioeconomic History   Marital status: Legally Separated    Spouse name: Not on file   Number of children: 3   Years of education: Not on file   Highest education level: 9th grade  Occupational History   Occupation: Emergency planning/management officer  Tobacco Use   Smoking status: Former    Types: Cigarettes   Smokeless tobacco: Never   Tobacco comments:    at age 62  Vaping Use   Vaping status: Every Day   Devices: THC  Substance and Sexual Activity   Alcohol use: Yes    Comment: Social drinking   Drug use: Not Currently    Comment: No active drug use. History of past drug use.   Sexual activity: Yes  Other Topics Concern   Not on file  Social History Narrative   Patient lives in [place] with his wife.   Had education up to 10th grade.   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   Has 3 kids. All healthy.         Social Drivers of Corporate investment banker Strain: Not on file  Food Insecurity: No Food Insecurity (04/17/2024)   Hunger Vital Sign    Worried About Running Out of Food in the Last Year: Never true    Ran Out of Food in the Last Year: Never true  Transportation Needs: No Transportation Needs (04/17/2024)   PRAPARE - Administrator, Civil Service (Medical): No    Lack of Transportation (Non-Medical): No  Physical Activity: Not on file  Stress: Not on file  Social Connections: Unknown (11/18/2021)   Received from [facility]   Social Network    Social Network: Not on file   Past Medical History:  Past Medical History:  Diagnosis Date   Anxiety    Attention deficit disorder    childhood   GERD (gastroesophageal reflux disease)    Headache(784.0)    Lumbar disc herniation    Muscle spasm of back 06/21/2012   Right inguinal hernia 07/14/2013    Past Surgical History:  Procedure  Laterality Date   HERNIA REPAIR     INGUINAL HERNIA REPAIR Right 07/16/2013   Procedure: HERNIA REPAIR INGUINAL ADULT;  Surgeon: Elon CHRISTELLA Pacini, MD;  Location: [facility] OR;  Service: General;  Laterality: Right;   INSERTION OF MESH Right 07/16/2013   Procedure: INSERTION OF MESH;  Surgeon: Elon CHRISTELLA Pacini, MD;  Location: [facility] OR;  Service: General;  Laterality: Right;   LUMBAR EPIDURAL INJECTION     Hx; of for pain   TONSILLECTOMY      Current Medications: Current Facility-Administered Medications  Medication Dose Route Frequency Provider Last Rate Last Admin  acetaminophen  (TYLENOL ) tablet 650 mg  650 mg Oral Q6H PRN Smith, Annie B, NP       alum & mag hydroxide-simeth (MAALOX/MYLANTA) 200-200-20 MG/5ML suspension 30 mL  30 mL Oral Q4H PRN Smith, Annie B, NP       haloperidol (HALDOL) tablet 5 mg  5 mg Oral TID PRN Smith, Annie B, NP       And   diphenhydrAMINE  (BENADRYL ) capsule 50 mg  50 mg Oral TID PRN Smith, Annie B, NP       haloperidol lactate (HALDOL) injection 5 mg  5 mg Intramuscular TID PRN Smith, Annie B, NP       And   diphenhydrAMINE  (BENADRYL ) injection 50 mg  50 mg Intramuscular TID PRN Smith, Annie B, NP       And   LORazepam (ATIVAN) injection 2 mg  2 mg Intramuscular TID PRN Smith, Annie B, NP       haloperidol lactate (HALDOL) injection 10 mg  10 mg Intramuscular TID PRN Smith, Annie B, NP       And   diphenhydrAMINE  (BENADRYL ) injection 50 mg  50 mg Intramuscular TID PRN Smith, Annie B, NP       And   LORazepam (ATIVAN) injection 2 mg  2 mg Intramuscular TID PRN Smith, Annie B, NP       hydrOXYzine  (ATARAX ) tablet 50 mg  50 mg Oral Q6H PRN Jadapalle, Sree, MD   50 mg at 04/22/24 1812   influenza vac split trivalent PF (FLUZONE) injection 0.5 mL  0.5 mL Intramuscular Tomorrow-1000 Donnelly Mellow, MD       lidocaine  (LIDODERM ) 5 % 1 patch  1 patch Transdermal Q24H Millington, Matthew E, PA-C   1 patch at 04/22/24 1230   magnesium  hydroxide (MILK OF MAGNESIA) suspension 30  mL  30 mL Oral Daily PRN Smith, Annie B, NP       phenylephrine-shark liver oil-mineral oil-petrolatum (PREPARATION H) rectal ointment 1 Application  1 Application Rectal BID PRN Millington, Matthew E, PA-C       venlafaxine  XR (EFFEXOR -XR) 24 hr capsule 150 mg  150 mg Oral Q breakfast Millington, Matthew E, PA-C   150 mg at 04/22/24 9158    Lab Results: No results found for this or any previous visit (from the past 48 hours).  Blood Alcohol level:  Lab Results  Component Value Date   ETH 173 (H) 04/17/2024   ETH <15 03/10/2024    Metabolic Disorder Labs: Lab Results  Component Value Date   HGBA1C 5.4 03/12/2024   MPG 108.28 03/12/2024   No results found for: PROLACTIN Lab Results  Component Value Date   CHOL 182 01/02/2023   TRIG 175.0 (H) 01/02/2023   HDL 59.20 01/02/2023   CHOLHDL 3 01/02/2023   VLDL 35.0 01/02/2023   LDLCALC 88 01/02/2023    Physical Findings: AIMS:  , ,  ,  ,    CIWA:    COWS:      Psychiatric Specialty Exam:  Presentation  General Appearance:  Appropriate for Environment  Eye Contact: Fair  Speech: Clear and Coherent  Speech Volume: Normal    Mood and Affect  Mood: Anxious  Affect: Congruent   Thought Process  Thought Processes: Coherent  Descriptions of Associations:Intact  Orientation:Full (Time, Place and Person)  Thought Content:WDL  Hallucinations: None  Ideas of Reference:None  Suicidal Thoughts: No  Homicidal Thoughts: No   Sensorium  Memory: Immediate Fair; Recent Fair; Remote Fair  Judgment: Fair  Insight:  Fair   Art therapist  Concentration: Fair  Attention Span: Fair  Recall: [employer] of Knowledge: Fair  Language: Fair   Psychomotor Activity  Psychomotor Activity: Normal  Musculoskeletal: Strength & Muscle Tone: within normal limits Gait & Station: normal Assets  Assets: Manufacturing systems engineer; Desire for Improvement; Housing; Social Support    Physical  Exam: Physical Exam Vitals and nursing note reviewed.  HENT:     Head: Atraumatic.  Eyes:     Extraocular Movements: Extraocular movements intact.  Pulmonary:     Effort: Pulmonary effort is normal.  Neurological:     Mental Status: He is alert and oriented to person, place, and time.    Review of Systems  Psychiatric/Behavioral:  Positive for depression. Negative for hallucinations and suicidal ideas. The patient is nervous/anxious. The patient does not have insomnia.    Blood pressure 116/87, pulse 66, temperature 98 F (36.7 C), temperature source Oral, resp. rate 20, SpO2 100%. There is no height or weight on file to calculate BMI.  Diagnosis: Principal Problem:   MDD (major depressive disorder), recurrent severe, without psychosis (HCC)   PLAN: Safety and Monitoring:  -- Voluntary admission to inpatient psychiatric unit for safety, stabilization and treatment  -- Daily contact with patient to assess and evaluate symptoms and progress in treatment  -- Patient's case to be discussed in multi-disciplinary team meeting  -- Observation Level : q15 minute checks  -- Vital signs:  q12 hours  -- Precautions: suicide, elopement, and assault -- Encouraged patient to participate in unit milieu and in scheduled group therapies  2. Psychiatric Diagnoses and Treatment:       MDD, recurrent, severe             PTSD First dose of effexor  150 on 10/12, continue once daily Continue hydroxyzine  50 mg every 6 hours as needed for anxiety   -- The risks/benefits/side-effects/alternatives to this medication were discussed in detail with the patient and time was given for questions. The patient consents to medication trial.                -- Metabolic profile and EKG monitoring obtained while on an atypical antipsychotic (BMI: Lipid Panel: HbgA1c: QTc:)              -- Encouraged patient to participate in unit milieu and in scheduled group therapies                            3. Medical  Issues Being Addressed:  No acute medical issues identified at this time  4. Discharge Planning:             -- Late week  -- Social work and case management to assist with discharge planning and identification of hospital follow-up needs prior to discharge  -- Estimated LOS: 5-7 days  Camelia LITTIE Lukes, PA-C 04/22/2024, 8:28 PM

## 2024-04-22 NOTE — Group Note (Signed)
 Date:  04/22/2024 Time:  11:04 AM  Group Topic/Focus:  Goals Group:   The focus of this group is to help patients establish daily goals to achieve during treatment and discuss how the patient can incorporate goal setting into their daily lives to aide in recovery.    Participation Level:  Active  Participation Quality:  Appropriate  Affect:  Appropriate  Cognitive:  Alert  Insight: Appropriate  Engagement in Group:  Engaged  Modes of Intervention:  Activity, Discussion, and Education  Additional Comments:    Skippy LITTIE Bennett 04/22/2024, 11:04 AM

## 2024-04-22 NOTE — Plan of Care (Signed)
  Problem: Coping: Goal: Ability to identify and develop effective coping behavior will improve Outcome: Progressing   Problem: Self-Concept: Goal: Ability to identify factors that promote anxiety will improve Outcome: Progressing Goal: Level of anxiety will decrease Outcome: Progressing   

## 2024-04-22 NOTE — Progress Notes (Signed)
   04/22/24 1100  Psych Admission Type (Psych Patients Only)  Admission Status Voluntary  Psychosocial Assessment  Patient Complaints Anxiety;Depression (Patient reports his anxiety and depression are improved since admission)  Eye Contact Fair  Facial Expression Animated  Affect Appropriate to circumstance  Speech Logical/coherent  Interaction Assertive  Motor Activity Other (Comment) (appropriate for developmental age)  Appearance/Hygiene Unremarkable  Behavior Characteristics Cooperative  Mood Pleasant (Patient writes his goal for today is to help some of the friends I have made here since he is feeling like myself again. He writes he will be a good listener to help reach that goal.)  Thought Process  Coherency WDL  Content WDL  Delusions None reported or observed  Perception WDL  Hallucination None reported or observed  Judgment WDL  Confusion None  Danger to Self  Current suicidal ideation? Denies  Self-Injurious Behavior No self-injurious ideation or behavior indicators observed or expressed   Danger to Others  Danger to Others None reported or observed

## 2024-04-22 NOTE — Group Note (Signed)
 Recreation Therapy Group Note   Group Topic:Health and Wellness  Group Date: 04/22/2024 Start Time: 1000 End Time: 1055 Facilitators: Celestia Jeoffrey BRAVO, LRT, CTRS Location: Courtyard  Group Description: [employer]. LRT and patients played games of basketball, drew with chalk, and played corn hole while outside in the courtyard while getting fresh air and sunlight. Music was being played in the background. LRT and peers conversed about different games they have played before, what they do in their free time and anything else that is on their minds. LRT encouraged pts to drink water after being outside, sweating and getting their heart rate up.  Goal Area(s) Addressed: Patient will build on frustration tolerance skills. Patients will partake in a competitive play game with peers. Patients will gain knowledge of new leisure interest/hobby.     Affect/Mood: Appropriate   Participation Level: Active and Engaged   Participation Quality: Independent   Behavior: Appropriate, Calm, and Cooperative   Speech/Thought Process: Coherent   Insight: Good and Improved   Judgement: Good   Modes of Intervention: Exploration, Music, and Rapport Building   Patient Response to Interventions:  Attentive, Engaged, and Receptive   Education Outcome:  Acknowledges education   Clinical Observations/Individualized Feedback: Anthony Harris was active in their participation of session activities and group discussion. Pt interacted well with LRT and peers duration of session.    Plan: Continue to engage patient in RT group sessions 2-3x/week.   Jeoffrey BRAVO Celestia, LRT, CTRS 04/22/2024 11:39 AM

## 2024-04-22 NOTE — Group Note (Signed)
 [facility] LCSW Group Therapy Note   Group Date: 04/22/2024 Start Time: 1300 End Time: 1400  Type of Therapy/Topic:  Group Therapy:  Feelings about Diagnosis  Participation Level:  Active    Description of Group:    This group will allow patients to explore their thoughts and feelings about diagnoses they have received. Patients will be guided to explore their level of understanding and acceptance of these diagnoses. Facilitator will encourage patients to process their thoughts and feelings about the reactions of others to their diagnosis, and will guide patients in identifying ways to discuss their diagnosis with significant others in their lives. This group will be process-oriented, with patients participating in exploration of their own experiences as well as giving and receiving support and challenge from other group members.   Therapeutic Goals: 1. Patient will demonstrate understanding of diagnosis as evidence by identifying two or more symptoms of the disorder:  2. Patient will be able to express two feelings regarding the diagnosis 3. Patient will demonstrate ability to communicate their needs through discussion and/or role plays  Summary of Patient Progress: Patient was present for the entirety of the group process. He was actively engaged in the discussion. Pt shared both negative and positive aspects of knowing/having a mental health diagnosis. He appeared open and receptive to feedback/comments from both his peers and the facilitator.    Therapeutic Modalities:   Cognitive Behavioral Therapy Brief Therapy Feelings Identification    Nadara JONELLE Fam, LCSW

## 2024-04-22 NOTE — Plan of Care (Signed)
  Problem: Self-Concept: Goal: Level of anxiety will decrease Outcome: Progressing Goal: Ability to modify response to factors that promote anxiety will improve Outcome: Progressing   

## 2024-04-23 DIAGNOSIS — F332 Major depressive disorder, recurrent severe without psychotic features: Secondary | ICD-10-CM | POA: Diagnosis not present

## 2024-04-23 NOTE — Plan of Care (Signed)

## 2024-04-23 NOTE — Progress Notes (Signed)
 [facility] MD Progress Note  04/23/2024 9:09 PM Anthony Harris  MRN:  [id number]   Subjective:  Chart reviewed, case discussed in multidisciplinary meeting, patient seen during rounds.   10/15: On interview today, patient is found interacting with peers on the unit.  He is pleasant, calm, and cooperative.  He reports continued improvement in anxiety and depression.  He reports tolerating current medication well and denies adverse effects.  He reports headaches have resolved.  He denies SI/HI/plan and denies hallucinations.  He reports upon discharge, he will return to living with his aunt, which he feels is a positive arrangement.  He denies access to guns or other lethal means.  He is established with a therapist and outpatient psychiatric provider.  He demonstrates insight into his mental health condition, treatment plan, and need for close outpatient follow-up.  He is able to discuss social support, coping skills, and crisis resources.  He remains linear, logical, and future oriented.  Continued inpatient hospitalization is necessary at this time for ongoing safety and treatment monitoring, and safe discharge planning due to hospital readmission after previous psychiatric hospitalization in September 2025.  10/14: On interview today, patient is noted to be pleasant, calm and cooperative.  He reports improved depression and anxiety today. He denies SI/HI/plan and denies hallucinations.  He states I feel more like myself than I have in a long time.  He has been actively participating in group therapies and unit activities.  He reports he is no longer having racing thoughts and has needed hydroxyzine  less frequently for anxiety.  He reports tolerating current medication regimen well overall.  He reports improvement in headaches.  He is future oriented, stating he is looking forward to meeting his newborn granddaughter.  Patient shares that he enjoys helping others and is finding fulfillment in this.  10/13:  On interview today, patient is noted to be interacting in common area with peers.  He is calm and cooperative, alert and oriented.  He reports increased situational anxiety today due to learning of news of birth of son's baby during which there were complications.  Patient is waiting to hear an update from his sister or ex-wife.  He rates anxiety as a 4-7 out of 10 and depression as 5 out of 10 today.  He denies SI/HI/plan and denies hallucinations.  He reports tolerating Effexor  dose increase, though does report mild, tolerable headache.  He would like to continue Effexor  at this time and continue to monitor.  He reports decreased appetite and fair sleep.  He feels he is maintaining adequate nutrition despite decreased appetite.  Patient reports he has been active in helping others on the unit and states he plans to donate clothes and other items upon his discharge.  He is future oriented, stating he feels inspired and motivated to help others moving forward, especially those who are struggling with mental health issues.  10/12: Patient seen today for follow-up they are alert and oriented.  They are pleasant and cooperative on exam.  They are linear logical and future oriented.  They are in anxiety is 5 out of 10.  They rated depression as 4 out of 10. Feels like he is here because he pulled back from his support team, feels he always runs in the time of stress. He does not want to call his family feels like he is a burden, but indicate he wants support.Wants to reach out to his sons, and this is encouraged. Denies adverse effects of medications. Denies SI/HI/AVH. Is  tearful and feels that his family does not understand mental health. Sleep and appetite are stable. Continues to identify divorce as primary sources of stress.   10/11: Patient seen today for follow-up they are alert and oriented.  They are pleasant and cooperative on exam.  They are linear logical and future oriented.  They are in anxiety is 4 out  of 10.  They rated depression as 3 out of 10.  They deny SI, HI, and AVH.  They report they only missed 2 days of their Effexor  and he had been taking this current dose for about a month.  They are agreeable to dose increase to 150 milligrams reviewed potential adverse effects.  They are agreeable with the plan.  Encouraged patient to work on developing coping mechanisms.  Discussed the importance of aftercare.  Encouraged him to work on identifying their social support system.  They report stable sleep and appetite.  They voiced no concerns or complaints.   Sleep: Good  Appetite: Good  Past Psychiatric History: see h&P Family History:  Family History  Problem Relation Age of Onset   Cancer Mother    Stroke Father    Hypercholesterolemia Father    Alcohol abuse Father    Diabetes Sister    Alcohol abuse Sister    Diabetes Brother    Hypertension Other    Cancer - Lung Other    Social History:  Social History   Substance and Sexual Activity  Alcohol Use Yes   Comment: Social drinking     Social History   Substance and Sexual Activity  Drug Use Not Currently   Comment: No active drug use. History of past drug use.    Social History   Socioeconomic History   Marital status: Legally Separated    Spouse name: Not on file   Number of children: 3   Years of education: Not on file   Highest education level: 9th grade  Occupational History   Occupation: Emergency planning/management officer  Tobacco Use   Smoking status: Former    Types: Cigarettes   Smokeless tobacco: Never   Tobacco comments:    at age 16  Vaping Use   Vaping status: Every Day   Devices: THC  Substance and Sexual Activity   Alcohol use: Yes    Comment: Social drinking   Drug use: Not Currently    Comment: No active drug use. History of past drug use.   Sexual activity: Yes  Other Topics Concern   Not on file  Social History Narrative   Patient lives in [place] with his wife.   Had education up to 10th grade.    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   Has 3 kids. All healthy.         Social Drivers of Corporate investment banker Strain: Not on file  Food Insecurity: No Food Insecurity (04/17/2024)   Hunger Vital Sign    Worried About Running Out of Food in the Last Year: Never true    Ran Out of Food in the Last Year: Never true  Transportation Needs: No Transportation Needs (04/17/2024)   PRAPARE - Administrator, Civil Service (Medical): No    Lack of Transportation (Non-Medical): No  Physical Activity: Not on file  Stress: Not on file  Social Connections: Unknown (11/18/2021)   [facility]   Social Network    Social Network: Not on file   Past Medical History:  Past Medical History:  Diagnosis Date   Anxiety    Attention deficit disorder    childhood   GERD (gastroesophageal reflux disease)    Headache(784.0)    Lumbar disc herniation    Muscle spasm of back 06/21/2012   Right inguinal hernia 07/14/2013    Past Surgical History:  Procedure Laterality Date   HERNIA REPAIR     INGUINAL HERNIA REPAIR Right 07/16/2013   Procedure: HERNIA REPAIR INGUINAL ADULT;  Surgeon: Elon CHRISTELLA Pacini, MD;  Location: [facility] OR;  Service: General;  Laterality: Right;   INSERTION OF MESH Right 07/16/2013   Procedure: INSERTION OF MESH;  Surgeon: Elon CHRISTELLA Pacini, MD;  Location: [facility] OR;  Service: General;  Laterality: Right;   LUMBAR EPIDURAL INJECTION     Hx; of for pain   TONSILLECTOMY      Current Medications: Current Facility-Administered Medications  Medication Dose Route Frequency Provider Last Rate Last Admin   acetaminophen  (TYLENOL ) tablet 650 mg  650 mg Oral Q6H PRN Smith, Annie B, NP       alum & mag hydroxide-simeth (MAALOX/MYLANTA) 200-200-20 MG/5ML suspension 30 mL  30 mL Oral Q4H PRN Smith, Annie B, NP       haloperidol (HALDOL) tablet 5 mg  5 mg Oral TID PRN Smith, Annie B, NP       And   diphenhydrAMINE  (BENADRYL ) capsule 50 mg  50 mg  Oral TID PRN Smith, Annie B, NP       haloperidol lactate (HALDOL) injection 5 mg  5 mg Intramuscular TID PRN Smith, Annie B, NP       And   diphenhydrAMINE  (BENADRYL ) injection 50 mg  50 mg Intramuscular TID PRN Smith, Annie B, NP       And   LORazepam (ATIVAN) injection 2 mg  2 mg Intramuscular TID PRN Smith, Annie B, NP       haloperidol lactate (HALDOL) injection 10 mg  10 mg Intramuscular TID PRN Smith, Annie B, NP       And   diphenhydrAMINE  (BENADRYL ) injection 50 mg  50 mg Intramuscular TID PRN Smith, Annie B, NP       And   LORazepam (ATIVAN) injection 2 mg  2 mg Intramuscular TID PRN Smith, Annie B, NP       hydrOXYzine  (ATARAX ) tablet 50 mg  50 mg Oral Q6H PRN Jadapalle, Sree, MD   50 mg at 04/23/24 1356   influenza vac split trivalent PF (FLUZONE) injection 0.5 mL  0.5 mL Intramuscular Tomorrow-1000 Donnelly Mellow, MD       lidocaine  (LIDODERM ) 5 % 1 patch  1 patch Transdermal Q24H Millington, Matthew E, PA-C   1 patch at 04/23/24 1234   magnesium  hydroxide (MILK OF MAGNESIA) suspension 30 mL  30 mL Oral Daily PRN Smith, Annie B, NP       phenylephrine-shark liver oil-mineral oil-petrolatum (PREPARATION H) rectal ointment 1 Application  1 Application Rectal BID PRN Millington, Matthew E, PA-C       venlafaxine  XR (EFFEXOR -XR) 24 hr capsule 150 mg  150 mg Oral Q breakfast Millington, Matthew E, PA-C   150 mg at 04/23/24 9190    Lab Results: No results found for this or any previous visit (from the past 48 hours).  Blood Alcohol level:  Lab Results  Component Value Date   ETH 173 (H) 04/17/2024   ETH <15 03/10/2024    Metabolic Disorder Labs: Lab Results  Component Value Date   HGBA1C 5.4 03/12/2024  MPG 108.28 03/12/2024   No results found for: PROLACTIN Lab Results  Component Value Date   CHOL 182 01/02/2023   TRIG 175.0 (H) 01/02/2023   HDL 59.20 01/02/2023   CHOLHDL 3 01/02/2023   VLDL 35.0 01/02/2023   LDLCALC 88 01/02/2023    Physical Findings: AIMS:   , ,  ,  ,    CIWA:    COWS:      Psychiatric Specialty Exam:  Presentation  General Appearance:  Appropriate for Environment  Eye Contact: Fair  Speech: Clear and Coherent  Speech Volume: Normal    Mood and Affect  Mood: Anxious  Affect: Congruent   Thought Process  Thought Processes: Coherent  Descriptions of Associations:Intact  Orientation:Full (Time, Place and Person)  Thought Content:WDL  Hallucinations: None  Ideas of Reference:None  Suicidal Thoughts: No  Homicidal Thoughts: No   Sensorium  Memory: Immediate Fair; Recent Fair; Remote Fair  Judgment: Fair  Insight: Fair   Art therapist  Concentration: Fair  Attention Span: Fair  Recall: [employer] of Knowledge: Fair  Language: Fair   Psychomotor Activity  Psychomotor Activity: Normal  Musculoskeletal: Strength & Muscle Tone: within normal limits Gait & Station: normal Assets  Assets: Manufacturing systems engineer; Desire for Improvement; Housing; Social Support    Physical Exam: Physical Exam Vitals and nursing note reviewed.  HENT:     Head: Atraumatic.  Eyes:     Extraocular Movements: Extraocular movements intact.  Pulmonary:     Effort: Pulmonary effort is normal.  Neurological:     Mental Status: He is alert and oriented to person, place, and time.    Review of Systems  Psychiatric/Behavioral:  Positive for depression. Negative for hallucinations and suicidal ideas. The patient is nervous/anxious. The patient does not have insomnia.    Blood pressure (!) 89/59, pulse 73, temperature 97.7 F (36.5 C), temperature source Oral, resp. rate 18, SpO2 98%. There is no height or weight on file to calculate BMI.  Diagnosis: Principal Problem:   MDD (major depressive disorder), recurrent severe, without psychosis (HCC)   PLAN: Safety and Monitoring:  -- Voluntary admission to inpatient psychiatric unit for safety, stabilization and treatment  -- Daily  contact with patient to assess and evaluate symptoms and progress in treatment  -- Patient's case to be discussed in multi-disciplinary team meeting  -- Observation Level : q15 minute checks  -- Vital signs:  q12 hours  -- Precautions: suicide, elopement, and assault -- Encouraged patient to participate in unit milieu and in scheduled group therapies  2. Psychiatric Diagnoses and Treatment:       MDD, recurrent, severe             PTSD First dose of effexor  150 on 10/12, continue once daily Continue hydroxyzine  50 mg every 6 hours as needed for anxiety   -- The risks/benefits/side-effects/alternatives to this medication were discussed in detail with the patient and time was given for questions. The patient consents to medication trial.                -- Metabolic profile and EKG monitoring obtained while on an atypical antipsychotic (BMI: Lipid Panel: HbgA1c: QTc:)              -- Encouraged patient to participate in unit milieu and in scheduled group therapies                            3. Medical Issues Being  Addressed:  No acute medical issues identified at this time  4. Discharge Planning:             -- Late week  -- Social work and case management to assist with discharge planning and identification of hospital follow-up needs prior to discharge  -- Estimated LOS: 5-7 days  Camelia LITTIE Lukes, PA-C 04/23/2024, 9:09 PM

## 2024-04-23 NOTE — Progress Notes (Signed)
   04/23/24 0809  Psych Admission Type (Psych Patients Only)  Admission Status Voluntary  Psychosocial Assessment  Patient Complaints Anxiety;Depression  Eye Contact Fair  Facial Expression Animated  Affect Appropriate to circumstance  Speech Logical/coherent  Interaction Assertive  Appearance/Hygiene Unremarkable  Behavior Characteristics Cooperative;Anxious  Mood Pleasant  Aggressive Behavior  Effect No apparent injury  Thought Process  Coherency WDL  Content WDL  Delusions None reported or observed  Perception WDL  Hallucination None reported or observed  Judgment WDL  Confusion None  Danger to Self  Current suicidal ideation? Denies  Self-Injurious Behavior No self-injurious ideation or behavior indicators observed or expressed   Agreement Not to Harm Self Yes  Description of Agreement Verbal  Danger to Others  Danger to Others None reported or observed

## 2024-04-23 NOTE — Group Note (Signed)
 LCSW Group Therapy Note  Group Date: 04/23/2024 Start Time: 1300 End Time: 1400   Type of Therapy and Topic:  Group Therapy - Healthy vs Unhealthy Coping Skills  Participation Level:  Active   Description of Group The focus of this group was to determine what unhealthy coping techniques typically are used by group members and what healthy coping techniques would be helpful in coping with various problems. Patients were guided in becoming aware of the differences between healthy and unhealthy coping techniques. Patients were asked to identify 2-3 healthy coping skills they would like to learn to use more effectively.  Therapeutic Goals Patients learned that coping is what human beings do all day long to deal with various situations in their lives Patients defined and discussed healthy vs unhealthy coping techniques Patients identified their preferred coping techniques and identified whether these were healthy or unhealthy Patients determined 2-3 healthy coping skills they would like to become more familiar with and use more often. Patients provided support and ideas to each other   Summary of Patient Progress:  During group, patient expressed openly. Patient proved open to input from peers and feedback from CSW. Patient demonstrated proficient insight into the subject matter, was respectful of peers, and participated throughout the entire session.   Therapeutic Modalities Cognitive Behavioral Therapy Motivational Interviewing  Anthony Harris Anthony Harris 04/23/2024  2:31 PM

## 2024-04-23 NOTE — Group Note (Signed)
 Date:  04/23/2024 Time:  8:45 PM  Group Topic/Focus:  Wrap-Up Group:   The focus of this group is to help patients review their daily goal of treatment and discuss progress on daily workbooks.    Participation Level:  Active  Participation Quality:  Appropriate and Attentive  Affect:  Appropriate  Cognitive:  Appropriate  Insight: Appropriate and Good  Engagement in Group:  Engaged  Modes of Intervention:  Support  Additional Comments:     Arlester CHRISTELLA Servant 04/23/2024, 8:45 PM

## 2024-04-23 NOTE — Progress Notes (Signed)
   04/23/24 0300  Psych Admission Type (Psych Patients Only)  Admission Status Voluntary  Psychosocial Assessment  Patient Complaints Anxiety;Depression  Eye Contact Fair  Facial Expression Animated  Affect Appropriate to circumstance  Speech Logical/coherent  Interaction Assertive  Motor Activity Other (Comment)  Appearance/Hygiene Unremarkable  Behavior Characteristics Cooperative;Anxious  Mood Pleasant  Aggressive Behavior  Effect No apparent injury  Thought Process  Coherency WDL  Content WDL  Delusions None reported or observed  Perception WDL  Hallucination None reported or observed  Judgment WDL  Confusion None  Danger to Self  Current suicidal ideation? Denies  Self-Injurious Behavior No self-injurious ideation or behavior indicators observed or expressed   Danger to Others  Danger to Others None reported or observed

## 2024-04-23 NOTE — Group Note (Signed)
 Date:  04/23/2024 Time:  1:10 PM  Group Topic/Focus:  Emotional Education:   The focus of this group is to discuss what feelings/emotions are, and how they are experienced.    Participation Level:  Active  Participation Quality:  Appropriate  Affect:  Appropriate  Cognitive:  Appropriate  Insight: Appropriate  Engagement in Group:  Engaged  Modes of Intervention:  Activity  Additional Comments:    Anthony Harris 04/23/2024, 1:10 PM

## 2024-04-23 NOTE — Group Note (Signed)
 Date:  04/23/2024 Time:  5:19 PM  Group Topic/Focus:  Activity Group:  The focus of the group is to promote activity for the patients and encourage them to go outside to the courtyard and get some fresh air and some exercise.    Participation Level:  Active  Participation Quality:  Appropriate  Affect:  Appropriate  Cognitive:  Appropriate  Insight: Appropriate  Engagement in Group:  Engaged  Modes of Intervention:  Activity  Additional Comments:    Camellia HERO Seini Lannom 04/23/2024, 5:19 PM

## 2024-04-23 NOTE — BH IP Treatment Plan (Signed)
 Interdisciplinary Treatment and Diagnostic Plan Update  04/23/2024 Time of Session: 14:00 Anthony Harris MRN: [id number]  Principal Diagnosis: MDD (major depressive disorder), recurrent severe, without psychosis (HCC)  Secondary Diagnoses: Principal Problem:   MDD (major depressive disorder), recurrent severe, without psychosis (HCC)   Current Medications:  Current Facility-Administered Medications  Medication Dose Route Frequency Provider Last Rate Last Admin   acetaminophen  (TYLENOL ) tablet 650 mg  650 mg Oral Q6H PRN Smith, Annie B, NP       alum & mag hydroxide-simeth (MAALOX/MYLANTA) 200-200-20 MG/5ML suspension 30 mL  30 mL Oral Q4H PRN Smith, Annie B, NP       haloperidol (HALDOL) tablet 5 mg  5 mg Oral TID PRN Smith, Annie B, NP       And   diphenhydrAMINE  (BENADRYL ) capsule 50 mg  50 mg Oral TID PRN Smith, Annie B, NP       haloperidol lactate (HALDOL) injection 5 mg  5 mg Intramuscular TID PRN Smith, Annie B, NP       And   diphenhydrAMINE  (BENADRYL ) injection 50 mg  50 mg Intramuscular TID PRN Smith, Annie B, NP       And   LORazepam (ATIVAN) injection 2 mg  2 mg Intramuscular TID PRN Smith, Annie B, NP       haloperidol lactate (HALDOL) injection 10 mg  10 mg Intramuscular TID PRN Smith, Annie B, NP       And   diphenhydrAMINE  (BENADRYL ) injection 50 mg  50 mg Intramuscular TID PRN Smith, Annie B, NP       And   LORazepam (ATIVAN) injection 2 mg  2 mg Intramuscular TID PRN Smith, Annie B, NP       hydrOXYzine  (ATARAX ) tablet 50 mg  50 mg Oral Q6H PRN Jadapalle, Sree, MD   50 mg at 04/23/24 1356   influenza vac split trivalent PF (FLUZONE) injection 0.5 mL  0.5 mL Intramuscular Tomorrow-1000 Donnelly Mellow, MD       lidocaine  (LIDODERM ) 5 % 1 patch  1 patch Transdermal Q24H Millington, Matthew E, PA-C   1 patch at 04/23/24 1234   magnesium  hydroxide (MILK OF MAGNESIA) suspension 30 mL  30 mL Oral Daily PRN Smith, Annie B, NP       phenylephrine-shark liver oil-mineral  oil-petrolatum (PREPARATION H) rectal ointment 1 Application  1 Application Rectal BID PRN Millington, Matthew E, PA-C       venlafaxine  XR (EFFEXOR -XR) 24 hr capsule 150 mg  150 mg Oral Q breakfast Millington, Matthew E, PA-C   150 mg at 04/23/24 0809   PTA Medications: Medications Prior to Admission  Medication Sig Dispense Refill Last Dose/Taking   hydrOXYzine  (VISTARIL ) 50 MG capsule Take 1 capsule (50 mg total) by mouth every 6 (six) hours as needed for anxiety. 120 capsule 2 Past Week   venlafaxine  XR (EFFEXOR -XR) 75 MG 24 hr capsule Take 1 capsule (75 mg total) by mouth daily with breakfast. 30 capsule 2 Past Week   hydrOXYzine  (ATARAX ) 25 MG tablet Take 25-50 mg by mouth every 6 (six) hours as needed. (Patient not taking: Reported on 04/18/2024)   Not Taking   ibuprofen  (ADVIL ) 200 MG tablet Take 200 mg by mouth every 6 (six) hours as needed for mild pain (pain score 1-3). (Patient not taking: Reported on 04/17/2024)   Not Taking   ketoconazole  (NIZORAL ) 2 % cream Apply 1 Application topically daily as needed (itching). (Patient not taking: Reported on 04/17/2024)   Not Taking  Patient Stressors: Marital or family conflict   Other: Change in family dynamics from conflict    Patient Strengths: Ability for insight  Active sense of humor  Motivation for treatment/growth  Supportive family/friends   Treatment Modalities: Medication Management, Group therapy, Case management,  1 to 1 session with clinician, Psychoeducation, Recreational therapy.   Physician Treatment Plan for Primary Diagnosis: MDD (major depressive disorder), recurrent severe, without psychosis (HCC) Long Term Goal(s): Improvement in symptoms so as ready for discharge   Short Term Goals: Ability to identify changes in lifestyle to reduce recurrence of condition will improve Ability to verbalize feelings will improve Ability to demonstrate self-control will improve Ability to identify and develop effective coping  behaviors will improve  Medication Management: Evaluate patient's response, side effects, and tolerance of medication regimen.  Therapeutic Interventions: 1 to 1 sessions, Unit Group sessions and Medication administration.  Evaluation of Outcomes: Progressing  Physician Treatment Plan for Secondary Diagnosis: Principal Problem:   MDD (major depressive disorder), recurrent severe, without psychosis (HCC)  Long Term Goal(s): Improvement in symptoms so as ready for discharge   Short Term Goals: Ability to identify changes in lifestyle to reduce recurrence of condition will improve Ability to verbalize feelings will improve Ability to demonstrate self-control will improve Ability to identify and develop effective coping behaviors will improve     Medication Management: Evaluate patient's response, side effects, and tolerance of medication regimen.  Therapeutic Interventions: 1 to 1 sessions, Unit Group sessions and Medication administration.  Evaluation of Outcomes: Progressing   RN Treatment Plan for Primary Diagnosis: MDD (major depressive disorder), recurrent severe, without psychosis (HCC) Long Term Goal(s): Knowledge of disease and therapeutic regimen to maintain health will improve  Short Term Goals: Ability to remain free from injury will improve, Ability to verbalize frustration and anger appropriately will improve, Ability to demonstrate self-control, Ability to participate in decision making will improve, Ability to verbalize feelings will improve, Ability to disclose and discuss suicidal ideas, Ability to identify and develop effective coping behaviors will improve, and Compliance with prescribed medications will improve  Medication Management: RN will administer medications as ordered by provider, will assess and evaluate patient's response and provide education to patient for prescribed medication. RN will report any adverse and/or side effects to prescribing  provider.  Therapeutic Interventions: 1 on 1 counseling sessions, Psychoeducation, Medication administration, Evaluate responses to treatment, Monitor vital signs and CBGs as ordered, Perform/monitor CIWA, COWS, AIMS and Fall Risk screenings as ordered, Perform wound care treatments as ordered.  Evaluation of Outcomes: Progressing   LCSW Treatment Plan for Primary Diagnosis: MDD (major depressive disorder), recurrent severe, without psychosis (HCC) Long Term Goal(s): Safe transition to appropriate next level of care at discharge, Engage patient in therapeutic group addressing interpersonal concerns.  Short Term Goals: Engage patient in aftercare planning with referrals and resources, Increase social support, Increase ability to appropriately verbalize feelings, Increase emotional regulation, Facilitate acceptance of mental health diagnosis and concerns, Facilitate patient progression through stages of change regarding substance use diagnoses and concerns, Identify triggers associated with mental health/substance abuse issues, and Increase skills for wellness and recovery  Therapeutic Interventions: Assess for all discharge needs, 1 to 1 time with Social worker, Explore available resources and support systems, Assess for adequacy in community support network, Educate family and significant other(s) on suicide prevention, Complete Psychosocial Assessment, Interpersonal group therapy.  Evaluation of Outcomes: Progressing   Progress in Treatment: Attending groups: Yes. Participating in groups: Yes. Taking medication as prescribed: Yes. Toleration medication:  Yes. Family/Significant other contact made: Yes, individual(s) contacted:  wife, Christy Dutson. Patient understands diagnosis: Yes. Discussing patient identified problems/goals with staff: Yes. Medical problems stabilized or resolved: Yes. Denies suicidal/homicidal ideation: Yes. Issues/concerns per patient self-inventory: No. Other:  None.  New problem(s) identified: No, Describe:  None   New Short Term/Long Term Goal(s):detox, elimination of symptoms of psychosis, medication management for mood stabilization; elimination of SI thoughts; development of comprehensive mental wellness/sobriety plan. Update 04/23/24: No changes at this time.     Patient Goals:  Get back on track from the last time I was at [employer]. Update 04/23/24: No changes at this time.   Discharge Plan or Barriers: CSW to assist with the development of appropriate discharge plan. Update 04/23/24: Pt to discharge to private residence with continued outpatient services.     Reason for Continuation of Hospitalization: Anxiety Delusions  Depression Hallucinations Suicidal ideation   Estimated Length of Stay: 1-7 days. Update 04/23/24: No changes at this time.  Last 3 [place] Suicide Severity Risk Score: Flowsheet Row Admission (Current) from 04/17/2024 in [facility] Most recent reading at 04/17/2024  5:00 PM ED from 04/17/2024 in [facility] Emergency Department at [facility] Most recent reading at 04/17/2024 12:21 AM Office Visit from 03/18/2024 in [facility] Most recent reading at 03/18/2024  8:50 AM  C-SSRS RISK CATEGORY High Risk High Risk High Risk    Last PHQ 2/9 Scores:    03/18/2024    8:50 AM 12/20/2023    2:10 PM 01/23/2023    3:46 PM  Depression screen PHQ 2/9  Decreased Interest 3 3 2   Down, Depressed, Hopeless 3 3 2   PHQ - 2 Score 6 6 4   Altered sleeping 3 3 3   Tired, decreased energy 3 3 2   Change in appetite 3 3 2   Feeling bad or failure about yourself  3 3 2   Trouble concentrating 3 3 2   Moving slowly or fidgety/restless 3 3 3   Suicidal thoughts 2 3 0  PHQ-9 Score 26 27 18   Difficult doing work/chores Extremely dIfficult  Somewhat difficult    Scribe for Treatment Team: Nadara JONELLE Fam, LCSW 04/23/2024 3:45 PM

## 2024-04-24 DIAGNOSIS — F332 Major depressive disorder, recurrent severe without psychotic features: Secondary | ICD-10-CM | POA: Diagnosis not present

## 2024-04-24 NOTE — Progress Notes (Signed)
 [facility] MD Progress Note  04/24/2024 11:23 AM Anthony Harris  MRN:  [id number]   Subjective:  Chart reviewed, case discussed in multidisciplinary meeting, patient seen during rounds.   10/16: On follow-up today, patient is found resting in bed.  He is pleasant, calm, cooperative, alert and oriented.  He reports continued improvement in anxiety and depression symptoms.  He rates depression as a 4 out of 10 and anxiety is 4 out of 10 today.  He denies SI/HI/plan and denies hallucinations.  He reports sleep and appetite remain stable.  He continues to tolerate current medication regimen well without adverse effects.  Patient has been engaged in discharge planning and states he feels safe with plan for discharge tomorrow.  He is able to discuss his support system, coping skills, and crisis resources.  He verbalizes willingness to engage in outpatient psychiatric services.  He has remained linear, logical, and future oriented.  He has demonstrated ability to utilize crisis resources as evidenced by his initial voluntary presentation to the emergency department when he was having suicidal thoughts.  Patient has consistently denied suicidal ideation on serial interviews during hospitalization. Safe discharge planning discussed with patient's aunt, Georgeanne Amik at [phone], with patient's consent.  Aunt does not voice any safety concerns and does not feel patient is at risk to harm himself or others.  Georgeanne is agreeable to patient's discharge tomorrow.  She confirms patient will be returning to live with her.  Discussed safety planning and crisis resources.  Georgeanne confirms patient does not have access to guns or other lethal means.  10/15: On interview today, patient is found interacting with peers on the unit.  He is pleasant, calm, and cooperative.  He reports continued improvement in anxiety and depression.  He reports tolerating current medication well and denies adverse effects.  He reports  headaches have resolved.  He denies SI/HI/plan and denies hallucinations.  He reports upon discharge, he will return to living with his aunt, which he feels is a positive arrangement.  He denies access to guns or other lethal means.  He is established with a therapist and outpatient psychiatric provider.  He demonstrates insight into his mental health condition, treatment plan, and need for close outpatient follow-up.  He is able to discuss social support, coping skills, and crisis resources.  He remains linear, logical, and future oriented.  Continued inpatient hospitalization is necessary at this time for ongoing safety and treatment monitoring, and safe discharge planning due to hospital readmission after previous psychiatric hospitalization in September 2025.  10/14: On interview today, patient is noted to be pleasant, calm and cooperative.  He reports improved depression and anxiety today. He denies SI/HI/plan and denies hallucinations.  He states I feel more like myself than I have in a long time.  He has been actively participating in group therapies and unit activities.  He reports he is no longer having racing thoughts and has needed hydroxyzine  less frequently for anxiety.  He reports tolerating current medication regimen well overall.  He reports improvement in headaches.  He is future oriented, stating he is looking forward to meeting his newborn granddaughter.  Patient shares that he enjoys helping others and is finding fulfillment in this.  10/13: On interview today, patient is noted to be interacting in common area with peers.  He is calm and cooperative, alert and oriented.  He reports increased situational anxiety today due to learning of news of birth of son's baby during which there were complications.  Patient  is waiting to hear an update from his sister or ex-wife.  He rates anxiety as a 4-7 out of 10 and depression as 5 out of 10 today.  He denies SI/HI/plan and denies hallucinations.  He  reports tolerating Effexor  dose increase, though does report mild, tolerable headache.  He would like to continue Effexor  at this time and continue to monitor.  He reports decreased appetite and fair sleep.  He feels he is maintaining adequate nutrition despite decreased appetite.  Patient reports he has been active in helping others on the unit and states he plans to donate clothes and other items upon his discharge.  He is future oriented, stating he feels inspired and motivated to help others moving forward, especially those who are struggling with mental health issues.  10/12: Patient seen today for follow-up they are alert and oriented.  They are pleasant and cooperative on exam.  They are linear logical and future oriented.  They are in anxiety is 5 out of 10.  They rated depression as 4 out of 10. Feels like he is here because he pulled back from his support team, feels he always runs in the time of stress. He does not want to call his family feels like he is a burden, but indicate he wants support.Wants to reach out to his sons, and this is encouraged. Denies adverse effects of medications. Denies SI/HI/AVH. Is tearful and feels that his family does not understand mental health. Sleep and appetite are stable. Continues to identify divorce as primary sources of stress.   10/11: Patient seen today for follow-up they are alert and oriented.  They are pleasant and cooperative on exam.  They are linear logical and future oriented.  They are in anxiety is 4 out of 10.  They rated depression as 3 out of 10.  They deny SI, HI, and AVH.  They report they only missed 2 days of their Effexor  and he had been taking this current dose for about a month.  They are agreeable to dose increase to 150 milligrams reviewed potential adverse effects.  They are agreeable with the plan.  Encouraged patient to work on developing coping mechanisms.  Discussed the importance of aftercare.  Encouraged him to work on identifying  their social support system.  They report stable sleep and appetite.  They voiced no concerns or complaints.   Sleep: Good  Appetite: Good  Past Psychiatric History: see h&P Family History:  Family History  Problem Relation Age of Onset   Cancer Mother    Stroke Father    Hypercholesterolemia Father    Alcohol abuse Father    Diabetes Sister    Alcohol abuse Sister    Diabetes Brother    Hypertension Other    Cancer - Lung Other    Social History:  Social History   Substance and Sexual Activity  Alcohol Use Yes   Comment: Social drinking     Social History   Substance and Sexual Activity  Drug Use Not Currently   Comment: No active drug use. History of past drug use.    Social History   Socioeconomic History   Marital status: Legally Separated    Spouse name: Not on file   Number of children: 3   Years of education: Not on file   Highest education level: 9th grade  Occupational History   Occupation: Emergency planning/management officer  Tobacco Use   Smoking status: Former    Types: Cigarettes   Smokeless tobacco: Never  Tobacco comments:    at age 17  Vaping Use   Vaping status: Every Day   Devices: THC  Substance and Sexual Activity   Alcohol use: Yes    Comment: Social drinking   Drug use: Not Currently    Comment: No active drug use. History of past drug use.   Sexual activity: Yes  Other Topics Concern   Not on file  Social History Narrative   Patient lives in [place] with his wife.   Had education up to 10th grade.   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   Has 3 kids. All healthy.         Social Drivers of Corporate investment banker Strain: Not on file  Food Insecurity: No Food Insecurity (04/17/2024)   Hunger Vital Sign    Worried About Running Out of Food in the Last Year: Never true    Ran Out of Food in the Last Year: Never true  Transportation Needs: No Transportation Needs (04/17/2024)   PRAPARE - Scientist, research (physical sciences) (Medical): No    Lack of Transportation (Non-Medical): No  Physical Activity: Not on file  Stress: Not on file  Social Connections: Unknown (11/18/2021)   Received from [facility]   Social Network    Social Network: Not on file   Past Medical History:  Past Medical History:  Diagnosis Date   Anxiety    Attention deficit disorder    childhood   GERD (gastroesophageal reflux disease)    Headache(784.0)    Lumbar disc herniation    Muscle spasm of back 06/21/2012   Right inguinal hernia 07/14/2013    Past Surgical History:  Procedure Laterality Date   HERNIA REPAIR     INGUINAL HERNIA REPAIR Right 07/16/2013   Procedure: HERNIA REPAIR INGUINAL ADULT;  Surgeon: Elon CHRISTELLA Pacini, MD;  Location: [facility] OR;  Service: General;  Laterality: Right;   INSERTION OF MESH Right 07/16/2013   Procedure: INSERTION OF MESH;  Surgeon: Elon CHRISTELLA Pacini, MD;  Location: [facility] OR;  Service: General;  Laterality: Right;   LUMBAR EPIDURAL INJECTION     Hx; of for pain   TONSILLECTOMY      Current Medications: Current Facility-Administered Medications  Medication Dose Route Frequency Provider Last Rate Last Admin   acetaminophen  (TYLENOL ) tablet 650 mg  650 mg Oral Q6H PRN Smith, Annie B, NP       alum & mag hydroxide-simeth (MAALOX/MYLANTA) 200-200-20 MG/5ML suspension 30 mL  30 mL Oral Q4H PRN Smith, Annie B, NP       haloperidol (HALDOL) tablet 5 mg  5 mg Oral TID PRN Smith, Annie B, NP       And   diphenhydrAMINE  (BENADRYL ) capsule 50 mg  50 mg Oral TID PRN Smith, Annie B, NP       haloperidol lactate (HALDOL) injection 5 mg  5 mg Intramuscular TID PRN Smith, Annie B, NP       And   diphenhydrAMINE  (BENADRYL ) injection 50 mg  50 mg Intramuscular TID PRN Smith, Annie B, NP       And   LORazepam (ATIVAN) injection 2 mg  2 mg Intramuscular TID PRN Smith, Annie B, NP       haloperidol lactate (HALDOL) injection 10 mg  10 mg Intramuscular TID PRN Smith, Annie B, NP       And    diphenhydrAMINE  (BENADRYL ) injection 50 mg  50 mg Intramuscular TID PRN  Claudene Zelda NOVAK, NP       And   LORazepam (ATIVAN) injection 2 mg  2 mg Intramuscular TID PRN Smith, Annie B, NP       hydrOXYzine  (ATARAX ) tablet 50 mg  50 mg Oral Q6H PRN Jadapalle, Sree, MD   50 mg at 04/23/24 1356   influenza vac split trivalent PF (FLUZONE) injection 0.5 mL  0.5 mL Intramuscular Tomorrow-1000 Donnelly Mellow, MD       lidocaine  (LIDODERM ) 5 % 1 patch  1 patch Transdermal Q24H Millington, Matthew E, PA-C   1 patch at 04/23/24 1234   magnesium  hydroxide (MILK OF MAGNESIA) suspension 30 mL  30 mL Oral Daily PRN Smith, Annie B, NP       phenylephrine-shark liver oil-mineral oil-petrolatum (PREPARATION H) rectal ointment 1 Application  1 Application Rectal BID PRN Millington, Matthew E, PA-C       venlafaxine  XR (EFFEXOR -XR) 24 hr capsule 150 mg  150 mg Oral Q breakfast Millington, Matthew E, PA-C   150 mg at 04/24/24 9144    Lab Results: No results found for this or any previous visit (from the past 48 hours).  Blood Alcohol level:  Lab Results  Component Value Date   ETH 173 (H) 04/17/2024   ETH <15 03/10/2024    Metabolic Disorder Labs: Lab Results  Component Value Date   HGBA1C 5.4 03/12/2024   MPG 108.28 03/12/2024   No results found for: PROLACTIN Lab Results  Component Value Date   CHOL 182 01/02/2023   TRIG 175.0 (H) 01/02/2023   HDL 59.20 01/02/2023   CHOLHDL 3 01/02/2023   VLDL 35.0 01/02/2023   LDLCALC 88 01/02/2023    Physical Findings: AIMS:  , ,  ,  ,    CIWA:    COWS:      Psychiatric Specialty Exam:  Presentation  General Appearance:  Appropriate for Environment  Eye Contact: Good  Speech: Clear and Coherent  Speech Volume: Normal    Mood and Affect  Mood: Euthymic  Affect: Congruent   Thought Process  Thought Processes: Coherent  Descriptions of Associations:Intact  Orientation:Full (Time, Place and Person)  Thought  Content:WDL  Hallucinations: None  Ideas of Reference:None  Suicidal Thoughts: No  Homicidal Thoughts: No   Sensorium  Memory: Immediate Fair; Recent Fair; Remote Fair  Judgment: Fair  Insight: Good   Executive Functions  Concentration: Fair  Attention Span: Fair  Recall: Fair  Fund of Knowledge: Fair  Language: Fair   Psychomotor Activity  Psychomotor Activity: Normal  Musculoskeletal: Strength & Muscle Tone: within normal limits Gait & Station: normal Assets  Assets: Manufacturing systems engineer; Desire for Improvement; Housing; Social Support    Physical Exam: Physical Exam Vitals and nursing note reviewed.  HENT:     Head: Atraumatic.  Eyes:     Extraocular Movements: Extraocular movements intact.  Pulmonary:     Effort: Pulmonary effort is normal.  Neurological:     Mental Status: He is alert and oriented to person, place, and time.    Review of Systems  Psychiatric/Behavioral:  Positive for depression. Negative for hallucinations and suicidal ideas. The patient is nervous/anxious. The patient does not have insomnia.    Blood pressure 114/79, pulse (!) 57, temperature 97.6 F (36.4 C), temperature source Oral, resp. rate 17, SpO2 100%. There is no height or weight on file to calculate BMI.  Diagnosis: Principal Problem:   MDD (major depressive disorder), recurrent severe, without psychosis (HCC)   PLAN: Safety and Monitoring:  --  Voluntary admission to inpatient psychiatric unit for safety, stabilization and treatment  -- Daily contact with patient to assess and evaluate symptoms and progress in treatment  -- Patient's case to be discussed in multi-disciplinary team meeting  -- Observation Level : q15 minute checks  -- Vital signs:  q12 hours  -- Precautions: suicide, elopement, and assault -- Encouraged patient to participate in unit milieu and in scheduled group therapies  2. Psychiatric Diagnoses and Treatment:       MDD, recurrent,  severe       PTSD First dose of effexor  150 on 10/12, continue once daily Continue hydroxyzine  50 mg every 6 hours as needed for anxiety   -- The risks/benefits/side-effects/alternatives to this medication were discussed in detail with the patient and time was given for questions. The patient consents to medication trial.                -- Metabolic profile and EKG monitoring obtained while on an atypical antipsychotic (BMI: Lipid Panel: HbgA1c: QTc:)              -- Encouraged patient to participate in unit milieu and in scheduled group therapies                            3. Medical Issues Being Addressed:  No acute medical issues identified at this time  4. Discharge Planning:             -- Late week  -- Social work and case management to assist with discharge planning and identification of hospital follow-up needs prior to discharge  -- Estimated LOS: 5-7 days  [employer], PA-C 04/24/2024, 11:23 AM

## 2024-04-24 NOTE — Group Note (Signed)
 Date:  04/24/2024 Time:  9:03 PM  Group Topic/Focus:  Coping With Mental Health Crisis:   The purpose of this group is to help patients identify strategies for coping with mental health crisis.  Group discusses possible causes of crisis and ways to manage them effectively.    Participation Level:  Active  Participation Quality:  Appropriate  Affect:  Appropriate  Cognitive:  Appropriate  Insight: Appropriate  Engagement in Group:  Engaged  Modes of Intervention:  Discussion  Additional Comments:    Elias Bordner L 04/24/2024, 9:03 PM

## 2024-04-24 NOTE — Plan of Care (Signed)
  Problem: Self-Concept: Goal: Level of anxiety will decrease Outcome: Progressing Goal: Ability to modify response to factors that promote anxiety will improve Outcome: Progressing   

## 2024-04-24 NOTE — Group Note (Signed)
 LCSW Group Therapy Note  Group Date: 04/24/2024 Start Time: 1315 End Time: 1400   Type of Therapy and Topic:  Group Therapy: Anger Cues and Responses  Participation Level:  Active   Description of Group:   In this group, patients learned how to recognize the physical, cognitive, emotional, and behavioral responses they have to anger-provoking situations.  They identified a recent time they became angry and how they reacted.  They analyzed how their reaction was possibly beneficial and how it was possibly unhelpful.  The group discussed a variety of healthier coping skills that could help with such a situation in the future.  Focus was placed on how helpful it is to recognize the underlying emotions to our anger, because working on those can lead to a more permanent solution as well as our ability to focus on the important rather than the urgent.  Therapeutic Goals: Patients will remember their last incident of anger and how they felt emotionally and physically, what their thoughts were at the time, and how they behaved. Patients will identify how their behavior at that time worked for them, as well as how it worked against them. Patients will explore possible new behaviors to use in future anger situations. Patients will learn that anger itself is normal and cannot be eliminated, and that healthier reactions can assist with resolving conflict rather than worsening situations.  Summary of Patient Progress:   Patient was active during the group. He shared how his emotions have led to anger in his past. He reports that he doesn't typically feel the physical symptoms of anger, he reports that he feels that he feel symptoms mentally.  He was appropriate in response of others and feedback.   Therapeutic Modalities:   Cognitive Behavioral Therapy    Sherryle JINNY Margo, LCSW 04/24/2024  2:40 PM

## 2024-04-24 NOTE — Plan of Care (Signed)
°  Problem: Education: °Goal: Ability to state activities that reduce stress will improve °Outcome: Progressing °  °Problem: Self-Concept: °Goal: Level of anxiety will decrease °Outcome: Progressing °Goal: Ability to modify response to factors that promote anxiety will improve °Outcome: Progressing °  °

## 2024-04-24 NOTE — Progress Notes (Signed)
   04/24/24 2300  Psych Admission Type (Psych Patients Only)  Admission Status Voluntary  Psychosocial Assessment  Patient Complaints None  Eye Contact Fair  Facial Expression Animated  Affect Appropriate to circumstance  Speech Logical/coherent  Interaction Assertive  Motor Activity Other (Comment) (No issues to report WNL)  Appearance/Hygiene Unremarkable  Behavior Characteristics Cooperative  Mood Pleasant  Aggressive Behavior  Effect No apparent injury  Thought Process  Coherency WDL  Content WDL  Delusions None reported or observed  Perception WDL  Hallucination None reported or observed  Judgment WDL  Confusion None  Danger to Self  Current suicidal ideation? Denies  Agreement Not to Harm Self Yes  Description of Agreement Verbalized

## 2024-04-24 NOTE — Group Note (Signed)
 Recreation Therapy Group Note   Group Topic:Coping Skills  Group Date: 04/24/2024 Start Time: 1000 End Time: 1050 Facilitators: Celestia Jeoffrey BRAVO, LRT, CTRS Location: Craft Room  Group Description: Mind Map.  Patient was provided a blank template of a diagram with 32 blank boxes in a tiered system, branching from the center (similar to a bubble chart). LRT directed patients to label the middle of the diagram Coping Skills. LRT and patients then came up with 8 different coping skills as examples. Pt were directed to record their coping skills in the 2nd tier boxes closest to the center.  Patients would then share their coping skills with the group as LRT wrote them out. LRT gave a handout of 99 different coping skills at the end of group.   Goal Area(s) Addressed: Patients will be able to define "coping skills". Patient will identify new coping skills.  Patient will increase communication.   Affect/Mood: N/A   Participation Level: Did not attend    Clinical Observations/Individualized Feedback: Patient did not attend group.   Plan: Continue to engage patient in RT group sessions 2-3x/week.   Jeoffrey BRAVO Celestia, LRT, CTRS 04/24/2024 12:56 PM

## 2024-04-24 NOTE — Progress Notes (Signed)
   04/24/24 1800  Psych Admission Type (Psych Patients Only)  Admission Status Voluntary  Psychosocial Assessment  Patient Complaints Anxiety;Depression (patient states this is because of my wife and family.)  Eye Contact Fair  Facial Expression Animated  Affect Appropriate to circumstance  Speech Logical/coherent  Interaction Assertive  Motor Activity Slow  Appearance/Hygiene Unremarkable  Behavior Characteristics Cooperative;Appropriate to situation  Mood Pleasant  Aggressive Behavior  Effect No apparent injury  Thought Process  Coherency WDL  Content WDL  Delusions None reported or observed  Perception WDL  Hallucination None reported or observed  Judgment WDL  Confusion None  Danger to Self  Current suicidal ideation? Denies  Self-Injurious Behavior No self-injurious ideation or behavior indicators observed or expressed   Agreement Not to Harm Self Yes  Description of Agreement Verbal  Danger to Others  Danger to Others None reported or observed   Patient's goal for today, per his self-inventory is to learn as much as I can about my mental health so I can get better, in which he will attend all of the groups and staying positive in order to achieve him achieve his goal.

## 2024-04-24 NOTE — Plan of Care (Signed)

## 2024-04-25 ENCOUNTER — Encounter (HOSPITAL_COMMUNITY)

## 2024-04-25 ENCOUNTER — Encounter (HOSPITAL_COMMUNITY): Payer: Self-pay

## 2024-04-25 DIAGNOSIS — F332 Major depressive disorder, recurrent severe without psychotic features: Secondary | ICD-10-CM | POA: Diagnosis not present

## 2024-04-25 MED ORDER — LIDOCAINE 5 % EX PTCH: 1.0000 | MEDICATED_PATCH | CUTANEOUS | Status: AC

## 2024-04-25 MED ORDER — VENLAFAXINE HCL ER 150 MG PO CP24: 150.0000 mg | ORAL_CAPSULE | Freq: Every day | ORAL | 0 refills | Status: DC

## 2024-04-25 MED ORDER — PHENYLEPHRINE-MINERAL OIL-PET 0.25-14-74.9 % RE OINT: 1.0000 | TOPICAL_OINTMENT | Freq: Two times a day (BID) | RECTAL | Status: AC | PRN

## 2024-04-25 NOTE — Progress Notes (Signed)
  [facility] Adult Case Management Discharge Plan :  Will you be returning to the same living situation after discharge:  Yes,  Patient to return home.  At discharge, do you have transportation home?: Yes,  Patient's wife to provide transportation.  Do you have the ability to pay for your medications: Yes,  [place] MEDICAID PREPAID HEALTH PLAN / [place] MEDICAID [employer] [place]  Release of information consent forms completed and in the chart;  Patient's signature needed at discharge.  Patient to Follow up at:  Follow-up Information     [facility] Follow up.   Specialty: Professional Counselor Why: Your appointment is scheduled for Monday, 04/28/24 at [facility]. Contact information: [facility] [employer] [address] [place]  [address] [place] [address] [phone]         [facility] Follow up.   Specialty: Urgent Care Why: Your appointment is scheduled for Tuesday, 04/29/24 at [facility]. Contact information: [address] [place]  [address] [phone]                Next level of care provider has access to [facility] Link:no  Safety Planning and Suicide Prevention discussed: Yes,  Education Completed; Woodfin Kiss, wife, [phone],  has been identified by the patient as the family member/significant other with whom the patient will be residing, and identified as the person(s) who will aid the patient in the event of a mental health crisis (suicidal ideations/suicide attempt).  With written consent from the patient, the family member/significant other has been provided the following suicide prevention education, prior to the and/or following the discharge of the patient.     Has patient been referred to the Quitline?: Patient refused referral for treatment  Patient has been referred for addiction treatment: No known substance use disorder.  Alveta CHRISTELLA Kerns, LCSW 04/25/2024, 10:35 AM

## 2024-04-25 NOTE — Discharge Summary (Signed)
 Physician Discharge Summary Note  Patient:  Anthony Harris is an 48 y.o., male MRN:  [id number] DOB:  05/18/1976 Patient phone:  [phone] (home)  Patient address:   [address] [place] [facility] [address],   Total time spent: 40 min Date of Admission:  04/17/2024 Date of Discharge: 04/25/2024   Reason for Admission:  Suicidal ideation, depression   Principal Problem: MDD (major depressive disorder), recurrent severe, without psychosis (HCC) Discharge Diagnoses: Principal Problem:   MDD (major depressive disorder), recurrent severe, without psychosis (HCC)   Past Psychiatric History: Depression   Family Psychiatric  History: Bipolar disorder Social History:  Social History   Substance and Sexual Activity  Alcohol Use Yes   Comment: Social drinking     Social History   Substance and Sexual Activity  Drug Use Not Currently   Comment: No active drug use. History of past drug use.    Social History   Socioeconomic History   Marital status: Legally Separated    Spouse name: Not on file   Number of children: 3   Years of education: Not on file   Highest education level: 9th grade  Occupational History   Occupation: Emergency planning/management officer  Tobacco Use   Smoking status: Former    Types: Cigarettes   Smokeless tobacco: Never   Tobacco comments:    at age 2  Vaping Use   Vaping status: Every Day   Devices: THC  Substance and Sexual Activity   Alcohol use: Yes    Comment: Social drinking   Drug use: Not Currently    Comment: No active drug use. History of past drug use.   Sexual activity: Yes  Other Topics Concern   Not on file  Social History Narrative   Patient lives in [place] with his wife.   Had education up to 10th grade.   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   Has 3 kids. All healthy.         Social Drivers of Corporate investment banker Strain: Not on file  Food Insecurity: No Food Insecurity (04/17/2024)   Hunger Vital Sign     Worried About Running Out of Food in the Last Year: Never true    Ran Out of Food in the Last Year: Never true  Transportation Needs: No Transportation Needs (04/17/2024)   PRAPARE - Administrator, Civil Service (Medical): No    Lack of Transportation (Non-Medical): No  Physical Activity: Not on file  Stress: Not on file  Social Connections: Unknown (11/18/2021)   [facility]   Social Network    Social Network: Not on file   Past Medical History:  Past Medical History:  Diagnosis Date   Anxiety    Attention deficit disorder    childhood   GERD (gastroesophageal reflux disease)    Headache(784.0)    Lumbar disc herniation    Muscle spasm of back 06/21/2012   Right inguinal hernia 07/14/2013    Past Surgical History:  Procedure Laterality Date   HERNIA REPAIR     INGUINAL HERNIA REPAIR Right 07/16/2013   Procedure: HERNIA REPAIR INGUINAL ADULT;  Surgeon: Elon CHRISTELLA Pacini, MD;  Location: [facility] OR;  Service: General;  Laterality: Right;   INSERTION OF MESH Right 07/16/2013   Procedure: INSERTION OF MESH;  Surgeon: Elon CHRISTELLA Pacini, MD;  Location: [facility] OR;  Service: General;  Laterality: Right;   LUMBAR EPIDURAL INJECTION     Hx; of  for pain   TONSILLECTOMY     Family History:  Family History  Problem Relation Age of Onset   Cancer Mother    Stroke Father    Hypercholesterolemia Father    Alcohol abuse Father    Diabetes Sister    Alcohol abuse Sister    Diabetes Brother    Hypertension Other    Cancer - Lung Other     Hospital Course:  *** Detailed risk assessment is complete based on clinical exam and individual risk factors and acute suicide risk is low and acute violence risk is low.     Currently, all modifiable risk of harm to self/harm to others have been addressed and patient is no longer appropriate for the acute inpatient setting and is able to continue treatment for mental health needs in the community with the supports as indicated below.   Patient is educated and verbalized understanding of discharge plan of care including medications, follow-up appointments, mental health resources and further crisis services in the community.  He is instructed to call 911 or present to the nearest emergency room should he experience any decompensation in mood, disturbance of bowel or return of suicidal/homicidal ideations.  Patient verbalizes understanding of this education and agrees to this plan of care  Physical Findings: AIMS:  , ,  ,  ,    CIWA:    COWS:        Psychiatric Specialty Exam:  Presentation  General Appearance:  Appropriate for Environment  Eye Contact: Good  Speech: Clear and Coherent  Speech Volume: Normal    Mood and Affect  Mood: Euthymic  Affect: Appropriate   Thought Process  Thought Processes: Coherent; Linear  Descriptions of Associations:Intact  Orientation:Full (Time, Place and Person)  Thought Content:Logical  Hallucinations:Hallucinations: None  Ideas of Reference:None  Suicidal Thoughts:Suicidal Thoughts: No  Homicidal Thoughts:Homicidal Thoughts: No   Sensorium  Memory: Immediate Good; Recent Good; Remote Good  Judgment: Good  Insight: Good   Executive Functions  Concentration: Good  Attention Span: Good  Recall: Good  Fund of Knowledge: Good  Language: Good   Psychomotor Activity  Psychomotor Activity: Psychomotor Activity: Normal  Musculoskeletal: Strength & Muscle Tone: within normal limits Gait & Station: normal Assets  Assets: Manufacturing systems engineer; Desire for Improvement; Housing; Social Support   Sleep  Sleep: Sleep: Good    Physical Exam: Physical Exam ROS Blood pressure (!) 114/93, pulse 69, temperature 97.6 F (36.4 C), temperature source Oral, resp. rate 18, SpO2 100%. There is no height or weight on file to calculate BMI.   Social History   Tobacco Use  Smoking Status Former   Types: Cigarettes  Smokeless Tobacco  Never  Tobacco Comments   at age 39   Tobacco Cessation:  N/A, patient does not currently use tobacco products   Blood Alcohol level:  Lab Results  Component Value Date   ETH 173 (H) 04/17/2024   ETH <15 03/10/2024    Metabolic Disorder Labs:  Lab Results  Component Value Date   HGBA1C 5.4 03/12/2024   MPG 108.28 03/12/2024   No results found for: PROLACTIN Lab Results  Component Value Date   CHOL 182 01/02/2023   TRIG 175.0 (H) 01/02/2023   HDL 59.20 01/02/2023   CHOLHDL 3 01/02/2023   VLDL 35.0 01/02/2023   LDLCALC 88 01/02/2023    See Psychiatric Specialty Exam and Suicide Risk Assessment completed by Attending Physician prior to discharge.  Discharge destination:  Home  Is patient on multiple antipsychotic therapies  at discharge:  No   Has Patient had three or more failed trials of antipsychotic monotherapy by history:  No  Recommended Plan for Multiple Antipsychotic Therapies: NA  Discharge Instructions     Diet - low sodium heart healthy   Complete by: As directed    Increase activity slowly   Complete by: As directed       Allergies as of 04/25/2024   No Known Allergies      Medication List     STOP taking these medications    hydrOXYzine  25 MG tablet Commonly known as: ATARAX    ibuprofen  200 MG tablet Commonly known as: ADVIL    ketoconazole  2 % cream Commonly known as: NIZORAL        TAKE these medications      Indication  hydrOXYzine  50 MG capsule Commonly known as: VISTARIL  Take 1 capsule (50 mg total) by mouth every 6 (six) hours as needed for anxiety.  Indication: Feeling Anxious   lidocaine  5 % Commonly known as: LIDODERM  Place 1 patch onto the skin daily. Remove & Discard patch within 12 hours or as directed by MD  Indication: Lower back pain   phenylephrine-shark liver oil-mineral oil-petrolatum 0.25-14-74.9 % rectal ointment Commonly known as: PREPARATION H Place 1 Application rectally 2 (two) times daily as  needed for hemorrhoids.  Indication: Hemorrhoids   venlafaxine  XR 150 MG 24 hr capsule Commonly known as: EFFEXOR -XR Take 1 capsule (150 mg total) by mouth daily with breakfast. Start taking on: April 26, 2024 What changed:  medication strength how much to take  Indication: Generalized Anxiety Disorder, Major Depressive Disorder, Posttraumatic Stress Disorder        Follow-up Information     [facility] Follow up.   Specialty: Professional Counselor Why: Your appointment is scheduled for Monday, 04/28/24 at [facility]. Contact information: [facility] [address] [place]  [address] [place] [address] [phone]         [facility] Follow up.   Specialty: Urgent Care Why: Your appointment is scheduled for Tuesday, 04/29/24 at [facility]. Contact information: [address] [place]  [address] [phone]                Follow-up recommendations:  Activity:  as tolerated    Signed: Camelia LITTIE Lukes, PA-C 04/25/2024, 9:14 AM

## 2024-04-25 NOTE — Progress Notes (Signed)
   04/25/24 1100  Psych Admission Type (Psych Patients Only)  Admission Status Voluntary  Psychosocial Assessment  Patient Complaints None  Eye Contact Fair  Facial Expression Other (Comment) (WNL)  Affect Appropriate to circumstance  Speech Logical/coherent  Interaction Assertive  Motor Activity Other (Comment) (WNL)  Appearance/Hygiene Unremarkable  Behavior Characteristics Cooperative  Mood Pleasant  Thought Process  Coherency WDL  Content WDL  Delusions None reported or observed  Hallucination None reported or observed  Judgment WDL  Confusion None  Danger to Self  Current suicidal ideation? Denies

## 2024-04-25 NOTE — Group Note (Signed)
 Recreation Therapy Group Note   Group Topic:Leisure Education  Group Date: 04/25/2024 Start Time: 1020 End Time: 1130 Facilitators: Celestia Jeoffrey BRAVO, LRT, CTRS Location: Craft Room  Group Description: Leisure. Patients were given the option to choose from journaling, coloring, drawing, making origami, playing with playdoh, listening to music or singing karaoke. LRT and pts discussed the meaning of leisure, the importance of participating in leisure during their free time/when they're outside of the hospital, as well as how our leisure interests can also serve as coping skills.   Goal Area(s) Addressed:  Patient will identify a current leisure interest.  Patient will learn the definition of "leisure". Patient will practice making a positive decision. Patient will have the opportunity to try a new leisure activity. Patient will communicate with peers and LRT.    Affect/Mood: Appropriate   Participation Level: Active and Engaged   Participation Quality: Independent   Behavior: Appropriate, Calm, and Cooperative   Speech/Thought Process: Coherent   Insight: Good   Judgement: Good   Modes of Intervention: Clarification, Education, Exploration, and Music   Patient Response to Interventions:  Attentive, Engaged, Interested , and Receptive   Education Outcome:  Acknowledges education   Clinical Observations/Individualized Feedback: Anthony Harris was active in their participation of session activities and group discussion. Pt identified ride my motorcycle and boxing as things he does in his free time. Pt chose to make origami while in group.    Plan: Continue to engage patient in RT group sessions 2-3x/week.   Jeoffrey BRAVO Celestia, LRT, CTRS 04/25/2024 1:09 PM

## 2024-04-25 NOTE — [facility] Suicide Risk Assessment (Signed)
 [facility] Discharge Suicide Risk Assessment   Principal Problem: MDD (major depressive disorder), recurrent severe, without psychosis (HCC) Discharge Diagnoses: Principal Problem:   MDD (major depressive disorder), recurrent severe, without psychosis (HCC)   Total Time spent with patient: 30 minutes  Musculoskeletal: Strength & Muscle Tone: within normal limits Gait & Station: normal Patient leans: N/A  Psychiatric Specialty Exam  Presentation  General Appearance:  Appropriate for Environment  Eye Contact: Good  Speech: Clear and Coherent  Speech Volume: Normal  Handedness: Right   Mood and Affect  Mood: Euthymic  Duration of Depression Symptoms: Greater than two weeks  Affect: Appropriate   Thought Process  Thought Processes: Coherent; Linear  Descriptions of Associations:Intact  Orientation:Full (Time, Place and Person)  Thought Content:Logical  History of Schizophrenia/Schizoaffective disorder:No  Duration of Psychotic Symptoms:No data recorded Hallucinations:Hallucinations: None  Ideas of Reference:None  Suicidal Thoughts:Suicidal Thoughts: No  Homicidal Thoughts:Homicidal Thoughts: No   Sensorium  Memory: Immediate Good; Recent Good; Remote Good  Judgment: Good  Insight: Good   Executive Functions  Concentration: Good  Attention Span: Good  Recall: Good  Fund of Knowledge: Good  Language: Good   Psychomotor Activity  Psychomotor Activity: Psychomotor Activity: Normal   Assets  Assets: Communication Skills; Desire for Improvement; Housing; Social Support   Sleep  Sleep: Sleep: Good  Estimated Sleeping Duration (Last 24 Hours): 7.00-8.25 hours  Physical Exam: Physical Exam ROS Blood pressure (!) 114/93, pulse 69, temperature 97.6 F (36.4 C), temperature source Oral, resp. rate 18, SpO2 100%. There is no height or weight on file to calculate BMI.  Mental Status Per Nursing Assessment::   On Admission:   Belief that plan would result in death, Suicide plan  Demographic Factors:  Male  Loss Factors: Loss of significant relationship  Historical Factors: Family history of mental illness or substance abuse  Risk Reduction Factors:   Sense of responsibility to family, Living with another person, especially a relative, and Positive coping skills or problem solving skills  Continued Clinical Symptoms:  Depression:   Recent sense of peace/wellbeing  Cognitive Features That Contribute To Risk:  None    Suicide Risk:  Minimal: No identifiable suicidal ideation.  Patients presenting with no risk factors but with morbid ruminations; may be classified as minimal risk based on the severity of the depressive symptoms   Follow-up Information     [facility] Follow up.   Specialty: Professional Counselor Why: Your appointment is scheduled for Monday, 04/28/24 at [facility]. Contact information: [facility] [place] [address] [place]  [address] [place] [address] [phone]         [facility] Follow up.   Specialty: Urgent Care Why: Your appointment is scheduled for Tuesday, 04/29/24 at [facility]. Contact information: [address] [place]  [address] [phone]                Plan Of Care/Follow-up recommendations:  Activity:  as tolerated   Anthony Cookston L Analysa Nutting, PA-C 04/25/2024, 9:10 AM

## 2024-04-25 NOTE — Group Note (Signed)
 Date:  04/25/2024 Time:  11:01 AM  Group Topic/Focus:  Goals Group:   The focus of this group is to help patients establish daily goals to achieve during treatment and discuss how the patient can incorporate goal setting into their daily lives to aide in recovery.    Participation Level:  Active  Participation Quality:  Appropriate  Affect:  Appropriate  Cognitive:  Alert  Insight: Appropriate  Engagement in Group:  Engaged  Modes of Intervention:  Activity, Discussion, and Education  Additional Comments:    Skippy LITTIE Bennett 04/25/2024, 11:01 AM

## 2024-04-25 NOTE — Progress Notes (Signed)

## 2024-04-29 ENCOUNTER — Ambulatory Visit (INDEPENDENT_AMBULATORY_CARE_PROVIDER_SITE_OTHER): Admitting: Psychiatry

## 2024-04-29 ENCOUNTER — Encounter (HOSPITAL_COMMUNITY): Payer: Self-pay | Admitting: Psychiatry

## 2024-04-29 ENCOUNTER — Telehealth: Payer: Self-pay

## 2024-04-29 VITALS — BP 114/83 | HR 81 | Temp 98.1°F | Ht 70.0 in | Wt 191.2 lb

## 2024-04-29 DIAGNOSIS — F410 Panic disorder [episodic paroxysmal anxiety] without agoraphobia: Secondary | ICD-10-CM | POA: Diagnosis not present

## 2024-04-29 DIAGNOSIS — Z0000 Encounter for general adult medical examination without abnormal findings: Secondary | ICD-10-CM | POA: Diagnosis not present

## 2024-04-29 MED ORDER — HYDROXYZINE PAMOATE 50 MG PO CAPS: 50.0000 mg | ORAL_CAPSULE | Freq: Four times a day (QID) | ORAL | 3 refills | Status: DC | PRN

## 2024-04-29 MED ORDER — VENLAFAXINE HCL ER 150 MG PO CP24: 150.0000 mg | ORAL_CAPSULE | Freq: Every day | ORAL | 3 refills | Status: DC

## 2024-04-29 NOTE — Telephone Encounter (Signed)
 Copied from CRM [id number]. Topic: Appointments - Transfer of Care >> Apr 29, 2024  2:47 PM Frederich PARAS wrote: Pt is requesting to transfer FROM: [facility] Pt is requesting to transfer TO: [facility]  Reason for requested transfer: new location  It is the responsibility of the team the patient would like to transfer to (Dr. Lauraine pereyra ) to reach out to the patient if for any reason this transfer is not acceptable.
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
## 2024-07-17 ENCOUNTER — Encounter (HOSPITAL_COMMUNITY): Payer: Self-pay | Admitting: Psychiatry

## 2024-07-17 ENCOUNTER — Ambulatory Visit (INDEPENDENT_AMBULATORY_CARE_PROVIDER_SITE_OTHER): Admitting: Psychiatry

## 2024-07-17 DIAGNOSIS — F410 Panic disorder [episodic paroxysmal anxiety] without agoraphobia: Secondary | ICD-10-CM | POA: Diagnosis not present

## 2024-07-17 MED ORDER — HYDROXYZINE PAMOATE 50 MG PO CAPS: 50.0000 mg | ORAL_CAPSULE | Freq: Four times a day (QID) | ORAL | 3 refills | Status: AC | PRN

## 2024-07-17 MED ORDER — VENLAFAXINE HCL ER 75 MG PO CP24: 225.0000 mg | ORAL_CAPSULE | Freq: Every day | ORAL | 3 refills | Status: AC
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
## 2024-08-15 ENCOUNTER — Telehealth: Payer: Self-pay

## 2024-08-15 ENCOUNTER — Encounter: Admitting: Nurse Practitioner

## 2024-08-15 NOTE — Telephone Encounter (Signed)
 Left VM to let pt know TOC needs to be rescheduled from 2.13.2026  E2C2 please help pt reschedule TOC with Nancyann Huh, NP if they call back

## 2024-08-22 ENCOUNTER — Encounter

## 2024-10-07 ENCOUNTER — Encounter (HOSPITAL_COMMUNITY): Admitting: Psychiatry
